# Patient Record
Sex: Male | Born: 1980 | Race: White | Hispanic: No | Marital: Married | Smoking: Former smoker
Health system: Southern US, Community
[De-identification: ages and names within clinical notes are randomized; demographics above are authoritative.]

## PROBLEM LIST (undated history)

## (undated) DIAGNOSIS — Z87448 Personal history of other diseases of urinary system: Secondary | ICD-10-CM

## (undated) DIAGNOSIS — Z8701 Personal history of pneumonia (recurrent): Secondary | ICD-10-CM

## (undated) DIAGNOSIS — R198 Other specified symptoms and signs involving the digestive system and abdomen: Secondary | ICD-10-CM

## (undated) DIAGNOSIS — Z87718 Personal history of other specified (corrected) congenital malformations of genitourinary system: Secondary | ICD-10-CM

## (undated) DIAGNOSIS — F419 Anxiety disorder, unspecified: Secondary | ICD-10-CM

## (undated) DIAGNOSIS — Q059 Spina bifida, unspecified: Secondary | ICD-10-CM

## (undated) DIAGNOSIS — Z932 Ileostomy status: Secondary | ICD-10-CM

## (undated) DIAGNOSIS — S82841A Displaced bimalleolar fracture of right lower leg, initial encounter for closed fracture: Secondary | ICD-10-CM

## (undated) DIAGNOSIS — Z87828 Personal history of other (healed) physical injury and trauma: Secondary | ICD-10-CM

## (undated) DIAGNOSIS — R1310 Dysphagia, unspecified: Secondary | ICD-10-CM

## (undated) DIAGNOSIS — S20219A Contusion of unspecified front wall of thorax, initial encounter: Secondary | ICD-10-CM

## (undated) DIAGNOSIS — Z87442 Personal history of urinary calculi: Secondary | ICD-10-CM

## (undated) DIAGNOSIS — K219 Gastro-esophageal reflux disease without esophagitis: Secondary | ICD-10-CM

## (undated) DIAGNOSIS — Z7409 Other reduced mobility: Secondary | ICD-10-CM

## (undated) DIAGNOSIS — T4145XA Adverse effect of unspecified anesthetic, initial encounter: Secondary | ICD-10-CM

## (undated) DIAGNOSIS — Q6410 Exstrophy of urinary bladder, unspecified: Secondary | ICD-10-CM

## (undated) DIAGNOSIS — Z8739 Personal history of other diseases of the musculoskeletal system and connective tissue: Secondary | ICD-10-CM

## (undated) DIAGNOSIS — T8859XA Other complications of anesthesia, initial encounter: Secondary | ICD-10-CM

## (undated) DIAGNOSIS — R6889 Other general symptoms and signs: Secondary | ICD-10-CM

## (undated) HISTORY — PX: BLADDER SURGERY: SHX569

## (undated) HISTORY — PX: HIP SURGERY: SHX245

## (undated) HISTORY — PX: BACK SURGERY: SHX140

## (undated) HISTORY — PX: LEG SURGERY: SHX1003

## (undated) HISTORY — PX: ABDOMINAL SURGERY: SHX537

---

## 2014-01-06 DIAGNOSIS — Z8701 Personal history of pneumonia (recurrent): Secondary | ICD-10-CM

## 2014-01-06 HISTORY — DX: Personal history of pneumonia (recurrent): Z87.01

## 2014-01-15 ENCOUNTER — Emergency Department (HOSPITAL_COMMUNITY)
Admission: EM | Admit: 2014-01-15 | Discharge: 2014-01-15 | Disposition: A | Discharge status: Home / Self Care | Payer: Medicaid Other | Attending: Emergency Medicine | Admitting: Emergency Medicine

## 2014-01-15 ENCOUNTER — Encounter (HOSPITAL_COMMUNITY): Payer: Self-pay | Admitting: *Deleted

## 2014-01-15 DIAGNOSIS — Q059 Spina bifida, unspecified: Secondary | ICD-10-CM | POA: Insufficient documentation

## 2014-01-15 DIAGNOSIS — Z87828 Personal history of other (healed) physical injury and trauma: Secondary | ICD-10-CM | POA: Diagnosis not present

## 2014-01-15 DIAGNOSIS — Z91010 Allergy to peanuts: Secondary | ICD-10-CM | POA: Diagnosis not present

## 2014-01-15 DIAGNOSIS — N201 Calculus of ureter: Secondary | ICD-10-CM | POA: Diagnosis not present

## 2014-01-15 DIAGNOSIS — Z932 Ileostomy status: Secondary | ICD-10-CM | POA: Diagnosis not present

## 2014-01-15 DIAGNOSIS — N200 Calculus of kidney: Secondary | ICD-10-CM | POA: Diagnosis present

## 2014-01-15 DIAGNOSIS — Q6410 Exstrophy of urinary bladder, unspecified: Secondary | ICD-10-CM | POA: Insufficient documentation

## 2014-01-15 DIAGNOSIS — Z720 Tobacco use: Secondary | ICD-10-CM | POA: Insufficient documentation

## 2014-01-15 HISTORY — DX: Spina bifida, unspecified: Q05.9

## 2014-01-15 LAB — CBC WITH DIFFERENTIAL/PLATELET
BASOS ABS: 0 10*3/uL (ref 0.0–0.1)
Basophils Relative: 0 % (ref 0–1)
Eosinophils Absolute: 0 10*3/uL (ref 0.0–0.7)
Eosinophils Relative: 0 % (ref 0–5)
HCT: 43.2 % (ref 39.0–52.0)
Hemoglobin: 14.7 g/dL (ref 13.0–17.0)
LYMPHS ABS: 1.1 10*3/uL (ref 0.7–4.0)
LYMPHS PCT: 4 % — AB (ref 12–46)
MCH: 29.3 pg (ref 26.0–34.0)
MCHC: 34 g/dL (ref 30.0–36.0)
MCV: 86.2 fL (ref 78.0–100.0)
Monocytes Absolute: 1.7 10*3/uL — ABNORMAL HIGH (ref 0.1–1.0)
Monocytes Relative: 6 % (ref 3–12)
NEUTROS ABS: 25.7 10*3/uL — AB (ref 1.7–7.7)
Neutrophils Relative %: 90 % — ABNORMAL HIGH (ref 43–77)
PLATELETS: 166 10*3/uL (ref 150–400)
RBC: 5.01 MIL/uL (ref 4.22–5.81)
RDW: 13.8 % (ref 11.5–15.5)
WBC: 28.4 10*3/uL — ABNORMAL HIGH (ref 4.0–10.5)

## 2014-01-15 LAB — LIPASE, BLOOD: LIPASE: 8 U/L — AB (ref 11–59)

## 2014-01-15 LAB — COMPREHENSIVE METABOLIC PANEL
ALBUMIN: 3.2 g/dL — AB (ref 3.5–5.2)
ALT: 35 U/L (ref 0–53)
AST: 36 U/L (ref 0–37)
Alkaline Phosphatase: 93 U/L (ref 39–117)
Anion gap: 17 — ABNORMAL HIGH (ref 5–15)
BUN: 20 mg/dL (ref 6–23)
CHLORIDE: 98 meq/L (ref 96–112)
CO2: 18 meq/L — AB (ref 19–32)
Calcium: 8.9 mg/dL (ref 8.4–10.5)
Creatinine, Ser: 2.04 mg/dL — ABNORMAL HIGH (ref 0.50–1.35)
GFR calc Af Amer: 48 mL/min — ABNORMAL LOW (ref >90)
GFR, EST NON AFRICAN AMERICAN: 41 mL/min — AB (ref >90)
Glucose, Bld: 92 mg/dL (ref 70–99)
Potassium: 4.5 mEq/L (ref 3.7–5.3)
SODIUM: 133 meq/L — AB (ref 137–147)
Total Bilirubin: 2.5 mg/dL — ABNORMAL HIGH (ref 0.3–1.2)
Total Protein: 7.8 g/dL (ref 6.0–8.3)

## 2014-01-15 MED ORDER — SODIUM CHLORIDE 0.9 % IV BOLUS (SEPSIS)
1000.0000 mL | Freq: Once | INTRAVENOUS | Status: AC
  Administered 2014-01-15: 1000 mL via INTRAVENOUS

## 2014-01-15 MED ORDER — ONDANSETRON HCL 4 MG/2ML IJ SOLN
4.0000 mg | Freq: Once | INTRAMUSCULAR | Status: AC
  Administered 2014-01-15: 4 mg via INTRAVENOUS
  Filled 2014-01-15: qty 2

## 2014-01-15 MED ORDER — HYDROMORPHONE HCL 1 MG/ML IJ SOLN
1.0000 mg | Freq: Once | INTRAMUSCULAR | Status: AC
  Administered 2014-01-15: 1 mg via INTRAVENOUS
  Filled 2014-01-15: qty 1

## 2014-01-15 NOTE — Discharge Instructions (Signed)
Return to the emergency room with worsening of symptoms, new symptoms or with symptoms that are concerning, especially fevers, chills, unable to urinate like normal, unable to keep fluids down. Call to make appointment as soon as possible with urology. Take your home pain medications as needed. Do not operate machinery, drive or drink alcohol while taking narcotics or muscle relaxers.   Kidney Stones Kidney stones (urolithiasis) are deposits that form inside your kidneys. The intense pain is caused by the stone moving through the urinary tract. When the stone moves, the ureter goes into spasm around the stone. The stone is usually passed in the urine.  CAUSES   A disorder that makes certain neck glands produce too much parathyroid hormone (primary hyperparathyroidism).  A buildup of uric acid crystals, similar to gout in your joints.  Narrowing (stricture) of the ureter.  A kidney obstruction present at birth (congenital obstruction).  Previous surgery on the kidney or ureters.  Numerous kidney infections. SYMPTOMS   Feeling sick to your stomach (nauseous).  Throwing up (vomiting).  Blood in the urine (hematuria).  Pain that usually spreads (radiates) to the groin.  Frequency or urgency of urination. DIAGNOSIS   Taking a history and physical exam.  Blood or urine tests.  CT scan.  Occasionally, an examination of the inside of the urinary bladder (cystoscopy) is performed. TREATMENT   Observation.  Increasing your fluid intake.  Extracorporeal shock wave lithotripsy--This is a noninvasive procedure that uses shock waves to break up kidney stones.  Surgery may be needed if you have severe pain or persistent obstruction. There are various surgical procedures. Most of the procedures are performed with the use of small instruments. Only small incisions are needed to accommodate these instruments, so recovery time is minimized. The size, location, and chemical composition  are all important variables that will determine the proper choice of action for you. Talk to your health care provider to better understand your situation so that you will minimize the risk of injury to yourself and your kidney.  HOME CARE INSTRUCTIONS   Drink enough water and fluids to keep your urine clear or pale yellow. This will help you to pass the stone or stone fragments.  Strain all urine through the provided strainer. Keep all particulate matter and stones for your health care provider to see. The stone causing the pain may be as small as a grain of salt. It is very important to use the strainer each and every time you pass your urine. The collection of your stone will allow your health care provider to analyze it and verify that a stone has actually passed. The stone analysis will often identify what you can do to reduce the incidence of recurrences.  Only take over-the-counter or prescription medicines for pain, discomfort, or fever as directed by your health care provider.  Make a follow-up appointment with your health care provider as directed.  Get follow-up X-rays if required. The absence of pain does not always mean that the stone has passed. It may have only stopped moving. If the urine remains completely obstructed, it can cause loss of kidney function or even complete destruction of the kidney. It is your responsibility to make sure X-rays and follow-ups are completed. Ultrasounds of the kidney can show blockages and the status of the kidney. Ultrasounds are not associated with any radiation and can be performed easily in a matter of minutes. SEEK MEDICAL CARE IF:  You experience pain that is progressive and unresponsive to any  pain medicine you have been prescribed. SEEK IMMEDIATE MEDICAL CARE IF:   Pain cannot be controlled with the prescribed medicine.  You have a fever or shaking chills.  The severity or intensity of pain increases over 18 hours and is not relieved by  pain medicine.  You develop a new onset of abdominal pain.  You feel faint or pass out.  You are unable to urinate. MAKE SURE YOU:   Understand these instructions.  Will watch your condition.  Will get help right away if you are not doing well or get worse. Document Released: 02/22/2005 Document Revised: 10/25/2012 Document Reviewed: 07/26/2012 [facility] Patient Information 2015 [employer] [place]. This information is not intended to replace advice given to you by your health care provider. Make sure you discuss any questions you have with your health care provider.

## 2014-01-15 NOTE — Progress Notes (Signed)
  CARE MANAGEMENT ED NOTE 01/15/2014  Patient:  Coye,Estefan   Account Number:  [id number]  Date Initiated:  01/15/2014  Documentation initiated by:  Radford PaxFERRERO,Monaye Blackie  Subjective/Objective Assessment:   Patient presents to Ed with  abdominal pian/kidney stone     Subjective/Objective Assessment Detail:     Action/Plan:   Action/Plan Detail:   Anticipated DC Date:       Status Recommendation to Physician:   Result of Recommendation:    Other ED Services  Consult Working Plan    DC Planning Services  Other  PCP issues    Choice offered to / List presented to:            Status of service:  Completed, signed off  ED Comments:   ED Comments Detail:  EDCM spoke to patient at bedisde.  Patient initially listed as not having a pcp or insurnace.  Patient reports he has Medicaid insurnace and will be seeing Dr. Concepcion [facility]Avbuere [facility] as [facility].  System updated.

## 2014-01-15 NOTE — ED Provider Notes (Signed)
CSN: [id number]     Arrival date & time 01/15/14  1537 History   First MD Initiated Contact with Patient 01/15/14 1608     Chief Complaint  Patient presents with  . Nephrolithiasis     (Consider location/radiation/quality/duration/timing/severity/associated sxs/prior Treatment) HPI  Blake Lara is a 33 y.o. male with PMH of spina bifida, bladder exstrophy, dislocated hip, ileostomy presenting with with persistent pain in left flank that radiates into left and diminishing groin. [facility] and diagnosed by CT with a 7 mm kidney stone in what patient believes with the left kidney. He was given Dilaudid 4 tablets which he has taken two of but did not get his script filled for dilaudid. Patient unable to describe the pain and endorses nausea and 3 episodes of emesis today. He says the pain is constant and is unable to describe it. He also endorses blood in his urine and states that his urine only dribbles and he is unable to urinate.   Past Medical History  Diagnosis Date  . Spina bifida   . Dislocated hip     hx of  . Bladder extrophy   . Ileostomy present    Past Surgical History  Procedure Laterality Date  . Abdominal surgery    . Colon surgery     History reviewed. No pertinent family history. History  Substance Use Topics  . Smoking status: Current Every Day Smoker -- 0.50 packs/day for .2 years    Types: Cigarettes  . Smokeless tobacco: Not on file  . Alcohol Use: Not on file    Review of Systems  Constitutional: Negative for fever and chills.  HENT: Negative for congestion and rhinorrhea.   Eyes: Negative for visual disturbance.  Respiratory: Negative for cough and shortness of breath.   Cardiovascular: Negative for chest pain and palpitations.  Gastrointestinal: Negative for nausea, vomiting and diarrhea.  Genitourinary: Positive for dysuria, hematuria and flank pain.  Musculoskeletal: Negative for back pain and gait problem.   Skin: Negative for rash.  Neurological: Negative for weakness and headaches.      Allergies  Eggs or egg-derived products; Latex; and Peanut-containing drug products  Home Medications   Prior to Admission medications   Medication Sig Start Date End Date Taking? Authorizing Provider  HYDROmorphone (DILAUDID) 4 MG tablet Take 8 mg by mouth every 4 (four) hours as needed for severe pain.   Yes Historical Provider, MD   BP 150/99 mmHg  Pulse 113  Temp(Src) 97.9 F (36.6 C) (Oral)  Resp 27  Wt 227 lb (102.967 kg)  SpO2 94% Physical Exam  Constitutional: He appears well-developed and well-nourished. No distress.  HENT:  Head: Normocephalic and atraumatic.  Eyes: Conjunctivae and EOM are normal. Right eye exhibits no discharge. Left eye exhibits no discharge.  Cardiovascular: Normal rate, regular rhythm and normal heart sounds.   Pulmonary/Chest: Effort normal and breath sounds normal. No respiratory distress. He has no wheezes.  Abdominal:  Abdomen soft with tenderness in all four quadrants with rebound, voluntary guarding. No rigidity. Hypoactive bowel sounds. L CVA tenderness.  Neurological: He is alert. He exhibits normal muscle tone. Coordination normal.  Skin: Skin is warm and dry. He is not diaphoretic.  Nursing note and vitals reviewed.   ED Course  Procedures (including critical care time) Labs Review Labs Reviewed  CBC WITH DIFFERENTIAL - Abnormal; Notable for the following:    WBC 28.4 (*)    Neutrophils Relative % 90 (*)    Neutro  Abs 25.7 (*)    Lymphocytes Relative 4 (*)    Monocytes Absolute 1.7 (*)    All other components within normal limits  COMPREHENSIVE METABOLIC PANEL - Abnormal; Notable for the following:    Sodium 133 (*)    CO2 18 (*)    Creatinine, Ser 2.04 (*)    Albumin 3.2 (*)    Total Bilirubin 2.5 (*)    GFR calc non Af Amer 41 (*)    GFR calc Af Amer 48 (*)    Anion gap 17 (*)    All other components within normal limits  LIPASE,  BLOOD - Abnormal; Notable for the following:    Lipase 8 (*)    All other components within normal limits    Imaging Review No results found.   EKG Interpretation None      MDM   Final diagnoses:  Ureterolithiasis   Pt diagnosed with kidney [facility] yesterday [facility]. We obtained medical records which showed 6-67mm kidney stone in left distal ureter with mild hydronephrosis and dilation of ureter in CT. Pt without appointment with urology. Patient refused giving urine sample because he cannot urinate or have catheterization due to his bladder exstrophy. Pt with creatine of 2.04 and spoke with his PCP Dr. Selena BattenKim who initially believed he had baseline labs but did not because he had no seen the patient in clinic before. Pt also with 28.4 WBC. Pts bp became soft at 83/39 likely due to dilaudid but it returned to 150/99 before discharge. Pt with tachycardia and pt states this is normal for him. Likely in part due to pain and was not reduced with fluids. Pt pain managed in ED. Patient is afebrile, nontoxic, and in no acute distress. Patient is appropriate for outpatient management and is stable for discharge. Pt given referral to [facility] and to follow up as soon as possible.    Discussed return precautions with patient. Discussed all results and patient verbalizes understanding and agrees with plan.  Case has been discussed with Dr. Effie ShyWentz who agrees with the above plan and to discharge.      Louann SjogrenVictoria L Loriann Bosserman, PA-C 01/16/14 [phone]  Flint MelterElliott L Wentz, MD 01/17/14 [phone]

## 2014-01-15 NOTE — ED Notes (Signed)
Per ems pt is from home, hx of spina bifida, went to [facility] yesterday, dx with 7 mm kidney stone left side, was given 4 dilaudid tablets upon discharge to take at home, also given presciption for dilaudid, pt did not get it filled, pt has only taken a total of 2 tablets since discharge. At present pain 10/10. Left flank area radiating to abdomen.

## 2014-01-15 NOTE — ED Notes (Signed)
Patient stated that he can not urinate, patient also stated that he can not be cathed. Patient says he constantly dribbles. PA and RN aware of situation

## 2014-01-16 ENCOUNTER — Inpatient Hospital Stay (HOSPITAL_COMMUNITY)
Admission: AD | Admit: 2014-01-16 | Discharge: 2014-01-23 | DRG: 659 | Disposition: A | Discharge status: Home / Self Care | Payer: Medicaid Other | Source: Ambulatory Visit | Attending: Internal Medicine | Admitting: Internal Medicine

## 2014-01-16 ENCOUNTER — Inpatient Hospital Stay (HOSPITAL_COMMUNITY): Payer: Medicaid Other

## 2014-01-16 DIAGNOSIS — T501X5A Adverse effect of loop [high-ceiling] diuretics, initial encounter: Secondary | ICD-10-CM | POA: Diagnosis present

## 2014-01-16 DIAGNOSIS — Y95 Nosocomial condition: Secondary | ICD-10-CM | POA: Diagnosis present

## 2014-01-16 DIAGNOSIS — N1330 Unspecified hydronephrosis: Secondary | ICD-10-CM

## 2014-01-16 DIAGNOSIS — R0602 Shortness of breath: Secondary | ICD-10-CM

## 2014-01-16 DIAGNOSIS — J9600 Acute respiratory failure, unspecified whether with hypoxia or hypercapnia: Secondary | ICD-10-CM

## 2014-01-16 DIAGNOSIS — Z933 Colostomy status: Secondary | ICD-10-CM

## 2014-01-16 DIAGNOSIS — R739 Hyperglycemia, unspecified: Secondary | ICD-10-CM | POA: Diagnosis present

## 2014-01-16 DIAGNOSIS — Q059 Spina bifida, unspecified: Secondary | ICD-10-CM

## 2014-01-16 DIAGNOSIS — G4733 Obstructive sleep apnea (adult) (pediatric): Secondary | ICD-10-CM | POA: Diagnosis present

## 2014-01-16 DIAGNOSIS — F40240 Claustrophobia: Secondary | ICD-10-CM | POA: Diagnosis present

## 2014-01-16 DIAGNOSIS — D696 Thrombocytopenia, unspecified: Secondary | ICD-10-CM | POA: Diagnosis present

## 2014-01-16 DIAGNOSIS — E876 Hypokalemia: Secondary | ICD-10-CM | POA: Diagnosis not present

## 2014-01-16 DIAGNOSIS — N132 Hydronephrosis with renal and ureteral calculous obstruction: Principal | ICD-10-CM | POA: Diagnosis present

## 2014-01-16 DIAGNOSIS — Q6410 Exstrophy of urinary bladder, unspecified: Secondary | ICD-10-CM

## 2014-01-16 DIAGNOSIS — E669 Obesity, unspecified: Secondary | ICD-10-CM | POA: Diagnosis present

## 2014-01-16 DIAGNOSIS — D649 Anemia, unspecified: Secondary | ICD-10-CM | POA: Diagnosis present

## 2014-01-16 DIAGNOSIS — F17210 Nicotine dependence, cigarettes, uncomplicated: Secondary | ICD-10-CM | POA: Diagnosis present

## 2014-01-16 DIAGNOSIS — R509 Fever, unspecified: Secondary | ICD-10-CM

## 2014-01-16 DIAGNOSIS — J9601 Acute respiratory failure with hypoxia: Secondary | ICD-10-CM | POA: Diagnosis not present

## 2014-01-16 DIAGNOSIS — J189 Pneumonia, unspecified organism: Secondary | ICD-10-CM | POA: Diagnosis not present

## 2014-01-16 DIAGNOSIS — N179 Acute kidney failure, unspecified: Secondary | ICD-10-CM | POA: Diagnosis not present

## 2014-01-16 DIAGNOSIS — R918 Other nonspecific abnormal finding of lung field: Secondary | ICD-10-CM | POA: Insufficient documentation

## 2014-01-16 DIAGNOSIS — Z87728 Personal history of other specified (corrected) congenital malformations of nervous system and sense organs: Secondary | ICD-10-CM

## 2014-01-16 DIAGNOSIS — N135 Crossing vessel and stricture of ureter without hydronephrosis: Secondary | ICD-10-CM

## 2014-01-16 DIAGNOSIS — Z6841 Body Mass Index (BMI) 40.0 and over, adult: Secondary | ICD-10-CM

## 2014-01-16 DIAGNOSIS — T50995A Adverse effect of other drugs, medicaments and biological substances, initial encounter: Secondary | ICD-10-CM | POA: Diagnosis not present

## 2014-01-16 DIAGNOSIS — E872 Acidosis: Secondary | ICD-10-CM | POA: Diagnosis present

## 2014-01-16 DIAGNOSIS — J690 Pneumonitis due to inhalation of food and vomit: Secondary | ICD-10-CM | POA: Diagnosis not present

## 2014-01-16 DIAGNOSIS — G92 Toxic encephalopathy: Secondary | ICD-10-CM | POA: Diagnosis not present

## 2014-01-16 DIAGNOSIS — N200 Calculus of kidney: Secondary | ICD-10-CM | POA: Diagnosis present

## 2014-01-16 DIAGNOSIS — T17908A Unspecified foreign body in respiratory tract, part unspecified causing other injury, initial encounter: Secondary | ICD-10-CM

## 2014-01-16 HISTORY — PX: PERCUTANEOUS NEPHROSTOMY: SHX2208

## 2014-01-16 MED ORDER — MIDAZOLAM HCL 2 MG/2ML IJ SOLN
INTRAMUSCULAR | Status: AC | PRN
  Administered 2014-01-16: 1 mg via INTRAVENOUS
  Administered 2014-01-16: 2 mg via INTRAVENOUS
  Administered 2014-01-16 (×2): 1 mg via INTRAVENOUS

## 2014-01-16 MED ORDER — FENTANYL CITRATE 0.05 MG/ML IJ SOLN
INTRAMUSCULAR | Status: AC
  Filled 2014-01-16: qty 6

## 2014-01-16 MED ORDER — DIPHENHYDRAMINE HCL 12.5 MG/5ML PO ELIX
12.5000 mg | ORAL_SOLUTION | Freq: Four times a day (QID) | ORAL | Status: DC | PRN
  Filled 2014-01-16: qty 5

## 2014-01-16 MED ORDER — CIPROFLOXACIN IN D5W 400 MG/200ML IV SOLN
400.0000 mg | Freq: Once | INTRAVENOUS | Status: AC
  Administered 2014-01-16: 400 mg via INTRAVENOUS

## 2014-01-16 MED ORDER — LIDOCAINE HCL 1 % IJ SOLN
INTRAMUSCULAR | Status: AC
Start: 2014-01-16 — End: 2014-01-16
  Administered 2014-01-16: 22:00:00
  Filled 2014-01-16: qty 40

## 2014-01-16 MED ORDER — DIPHENHYDRAMINE HCL 50 MG/ML IJ SOLN
INTRAMUSCULAR | Status: AC
  Administered 2014-01-16: 37.5 mg via INTRAVENOUS
  Filled 2014-01-16: qty 1

## 2014-01-16 MED ORDER — DOCUSATE SODIUM 100 MG PO CAPS
100.0000 mg | ORAL_CAPSULE | Freq: Two times a day (BID) | ORAL | Status: DC
Start: 2014-01-16 — End: 2014-01-23
  Administered 2014-01-16 – 2014-01-23 (×13): 100 mg via ORAL
  Filled 2014-01-16 (×15): qty 1

## 2014-01-16 MED ORDER — CIPROFLOXACIN IN D5W 400 MG/200ML IV SOLN
INTRAVENOUS | Status: AC
  Administered 2014-01-16: 400 mg via INTRAVENOUS
  Filled 2014-01-16: qty 200

## 2014-01-16 MED ORDER — ONDANSETRON HCL 4 MG/2ML IJ SOLN
4.0000 mg | INTRAMUSCULAR | Status: DC | PRN
  Filled 2014-01-16: qty 2

## 2014-01-16 MED ORDER — DIPHENHYDRAMINE HCL 50 MG/ML IJ SOLN
12.5000 mg | Freq: Four times a day (QID) | INTRAMUSCULAR | Status: DC | PRN
  Administered 2014-01-16 – 2014-01-17 (×2): 12.5 mg via INTRAVENOUS
  Filled 2014-01-16 (×2): qty 1

## 2014-01-16 MED ORDER — CEFTRIAXONE SODIUM IN DEXTROSE 20 MG/ML IV SOLN
1.0000 g | INTRAVENOUS | Status: DC
  Administered 2014-01-16: 1 g via INTRAVENOUS
  Filled 2014-01-16: qty 50

## 2014-01-16 MED ORDER — IOHEXOL 300 MG/ML  SOLN
20.0000 mL | Freq: Once | INTRAMUSCULAR | Status: AC | PRN
  Administered 2014-01-16: 15 mL

## 2014-01-16 MED ORDER — MIDAZOLAM HCL 2 MG/2ML IJ SOLN
INTRAMUSCULAR | Status: AC
Start: 2014-01-16 — End: 2014-01-16
  Filled 2014-01-16: qty 6

## 2014-01-16 MED ORDER — FENTANYL CITRATE 0.05 MG/ML IJ SOLN
INTRAMUSCULAR | Status: AC | PRN
  Administered 2014-01-16 (×2): 25 ug via INTRAVENOUS
  Administered 2014-01-16: 50 ug via INTRAVENOUS

## 2014-01-16 MED ORDER — IOHEXOL 300 MG/ML  SOLN
INTRAMUSCULAR | Status: AC | PRN
  Administered 2014-01-16: 1 mL

## 2014-01-16 MED ORDER — ONDANSETRON HCL 4 MG/2ML IJ SOLN
4.0000 mg | Freq: Four times a day (QID) | INTRAMUSCULAR | Status: DC | PRN
Start: 2014-01-16 — End: 2014-01-23
  Administered 2014-01-16 – 2014-01-23 (×9): 4 mg via INTRAVENOUS
  Filled 2014-01-16 (×9): qty 2

## 2014-01-16 MED ORDER — DEXTROSE-NACL 5-0.45 % IV SOLN
INTRAVENOUS | Status: DC
  Administered 2014-01-16: 18:00:00 via INTRAVENOUS

## 2014-01-16 MED ORDER — HYDROMORPHONE HCL 1 MG/ML IJ SOLN
0.5000 mg | INTRAMUSCULAR | Status: DC | PRN
  Administered 2014-01-16: 1 mg via INTRAVENOUS
  Filled 2014-01-16: qty 1

## 2014-01-16 MED ORDER — ENOXAPARIN SODIUM 30 MG/0.3ML ~~LOC~~ SOLN
30.0000 mg | SUBCUTANEOUS | Status: DC
  Administered 2014-01-17 – 2014-01-19 (×4): 30 mg via SUBCUTANEOUS
  Filled 2014-01-16 (×6): qty 0.3

## 2014-01-16 MED ORDER — DIPHENHYDRAMINE HCL 50 MG/ML IJ SOLN
50.0000 mg | Freq: Once | INTRAMUSCULAR | Status: AC
  Administered 2014-01-16: 37.5 mg via INTRAVENOUS

## 2014-01-16 MED ORDER — ZOLPIDEM TARTRATE 5 MG PO TABS
5.0000 mg | ORAL_TABLET | Freq: Every evening | ORAL | Status: DC | PRN
  Administered 2014-01-16: 5 mg via ORAL
  Filled 2014-01-16: qty 1

## 2014-01-16 MED ORDER — CIPROFLOXACIN IN D5W 400 MG/200ML IV SOLN: 400.0000 mg | INTRAVENOUS | Status: DC

## 2014-01-16 MED ORDER — HYDROMORPHONE HCL 1 MG/ML IJ SOLN
1.0000 mg | INTRAMUSCULAR | Status: DC | PRN
  Administered 2014-01-16: 1 mg via INTRAVENOUS
  Administered 2014-01-17 (×2): 2 mg via INTRAVENOUS
  Filled 2014-01-16: qty 2
  Filled 2014-01-16: qty 1
  Filled 2014-01-16: qty 2

## 2014-01-16 NOTE — Procedures (Signed)
Procedure:  Left percutaneous nephrostomy Findings:  Moderate left hydronephrosis.  10 Fr PCN placed and formed in renal pelvis. Attached to gravity bag.  Urine sample sent for culture.

## 2014-01-16 NOTE — Consult Note (Signed)
Reason for consult: left percutaneous nephrostomy   Referring Physician(s): Dr. Laverle PatterBorden  History of Present Illness: Blake Lara is a 33 y.o. male with history of spina bifida and bladder extrophy, status post multiple urologic procedures with his last procedure having been [facility] at age 33. 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 He was apparently incarcerated for 17 years and has not had any ongoing urologic care over that period of time. He also apparently has had nephrostomy tubes in the past and possibly ureteral procedures although there is no documented history of present today. He developed the acute onset of severe left-sided flank pain with radiation of his left lower quadrant 2 days ago. This was associated with fever up to 102F. He has had shaking chills. He has no prior history of kidney stones. He was seen in [place] emergency department [place] and a CT scan was performed which demonstrated a distal 6-7 mm obstructing ureteral calculus and 2-3 small nonobstructing left renal calculi.  He was apparently discharged home and then [facility] evening. At that time, he was noted to have a white blood count of 28,000 and a serum creatinine of 2.0. He was also noted to be hypotensive and tachycardic. However, he was discharged home from the ED with plans for outpatient follow-up. He presents today with continued severe left-sided flank pain. He has had significant nausea and vomiting. Request now received for left percutaneous nephrostomy.    Past Medical History  Diagnosis Date  . Spina bifida   . Dislocated hip     hx of  . Bladder extrophy   . Ileostomy present     Past Surgical History  Procedure Laterality Date  . Abdominal surgery    . Colon surgery      Allergies: Eggs or egg-derived products; Latex; and Peanut-containing drug  products  Medications: Prior to Admission medications   Medication Sig Start Date End Date Taking? Authorizing Provider  HYDROmorphone (DILAUDID) 4 MG tablet Take 8 mg by mouth every 4 (four) hours as needed for severe pain.    Historical Provider, MD    No family history on file.  History   Social History  . Marital Status: Married    Spouse Name: N/A    Number of Children: N/A  . Years of Education: N/A   Social History Main Topics  . Smoking status: Current Every Day Smoker -- 0.50 packs/day for .2 years    Types: Cigarettes  . Smokeless tobacco: Not on file  . Alcohol Use: Not on file  . Drug Use: No  . Sexual Activity: Not on file   Other Topics Concern  . Not on file   Social History Narrative  . No narrative on file        Review of Systems  see H& P  Vital Signs: BP 107/43 mmHg  Pulse 100  Temp(Src) 97.3 F (36.3 C) (Oral)  Resp 20  SpO2 96%  Physical Exam pt awake/alert; chest- sl dim BS bases; heart - tachy but regular; abd- soft, +BS, obese, multiple old incisions /scars noted, LLQ ostomy (ileostomy per pt), diffuse tenderness, primarily left lower abd/flank region  Imaging: No results found.  Labs:  CBC:  Recent Labs  01/15/14 1702  WBC 28.4*  HGB 14.7  HCT 43.2  PLT 166    COAGS: No results for input(s): INR, APTT in the last 8760 hours.  BMP:  Recent Labs  01/15/14 1702  NA 133*  K 4.5  CL 98  CO2 18*  GLUCOSE 92  BUN 20  CALCIUM 8.9  CREATININE 2.04*  GFRNONAA 41*  GFRAA 48*    LIVER FUNCTION TESTS:  Recent Labs  01/15/14 1702  BILITOT 2.5*  AST 36  ALT 35  ALKPHOS 93  PROT 7.8  ALBUMIN 3.2*    TUMOR MARKERS: No results for input(s): AFPTM, CEA, CA199, CHROMGRNA in the last 8760 hours.  Assessment and Plan: Pt with recent fever, abd pain, leukocytosis, left ureteral stone with probable obstructive hydronephrosis, hx bladder extrophy; case d/w Drs. Borden/Yamagata. Plan is for left percutaneous  nephrostomy this evening for decompression. Details/risks of procedure d/w pt/family with their understanding and consent.        I spent a total of 20 minutes face to face in clinical consultation, greater than 50% of which was counseling/coordinating care for left percutaneous nephrostomy.  Signed: Chinita PesterALLRED,D KEVIN 01/16/2014, 5:00 PM

## 2014-01-16 NOTE — H&P (Addendum)
Chief Complaint Kidney stone   History of Present Illness Blake Lara is a 33 year old gentleman who apparently has a history of spina bifida and bladder exstrophy status post multiple urologic procedures with his last procedure having been [facility] at age 33. 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 He was apparently incarcerated for 17 years and has not had any ongoing urologic care over that period of time. He also apparently has had nephrostomy tubes in the past and possibly ureteral procedures although there is no documented history of present today. He developed the acute onset of severe left-sided flank pain with radiation of his left lower quadrant 2 days ago. This was associated with fever up to 102F. He has had shaking chills. He has no prior history of kidney stones. He was seen in [place] emergency department [place] and a CT scan was performed which demonstrated a distal 6-7 mm obstructing ureteral calculus and 2-3 small nonobstructing left renal calculi. These films are not present for independent review today although I do have the report. He was apparently discharged home and then presented [facility] last evening. At that time, he was noted to have a white blood count of 28,000 and a serum creatinine of 2.0. He was also noted to be hypotensive and tachycardic. However, he was discharged home from the ED with plans for outpatient follow-up. He presents today with continued severe left-sided flank pain. He has had significant nausea and vomiting.   Past Medical History Problems  1. 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 History of dislocation of hip (Z87.39) 4. History of spina bifida (W41.324(Z87.728)  Surgical History Problems  1. History of Abdominal Surgery 2. History of Hip Surgery  He has a history of multiple abdominal procedures and is  unsure of exactly what type of procedures he has had in the past. On talking to him, it sounds like he has had a colostomy as well as an attempt at repair of his bladder exstrophy multiple times.   Current Meds 1. Ciprofloxacin HCl - 500 MG Oral Tablet;  Therapy: (Recorded:11Nov2015) to Recorded 2. Dilaudid 4 MG Oral Tablet (HYDROmorphone HCl);  Therapy: (Recorded:11Nov2015) to Recorded 3. Opana ER 20 MG TB12 (Oxymorphone HCl ER);  Therapy: (Recorded:11Nov2015) to Recorded 4. Roxicet 5-325 MG Oral Tablet;  Therapy: (Recorded:11Nov2015) to Recorded  Allergies Medication  1. Aleve TABS Non-Medication  2. Eggs 3. Latex 4. Peanuts  Family History Problems  1. Family history of Blood clot in vein : Sister 2. Family history of chronic obstructive pulmonary disease (Z82.5) : Mother 3. Family history of diabetes mellitus (Z83.3) : Mother, Sister 4. Family history of hypertension (Z82.49) : Mother, Sister 5. Denied: No pertinent family history : Father  Social History Problems    Current every day smoker (F17.200)   Married  Review of Systems  Gastrointestinal: nausea and vomiting.  Constitutional: fever, night sweats, feeling tired (fatigue) and recent weight loss.  Respiratory: shortness of breath and cough.  Musculoskeletal: back pain.  Neurological: dizziness.  Psychiatric: anxiety and depression.    Vitals Vital Signs [Data Includes: Last 1 Day]  Recorded: 11Nov2015 03:12PM  Height: 4 ft 11 in Weight: 228 lb  BMI Calculated: 46.05 BSA Calculated: 1.95 Blood Pressure: 103 / 68 Temperature: 98 F Heart Rate: 99 Recorded: 11Nov2015 03:06PM  Height: 4 ft  Weight: 104 lb  BMI Calculated: 31.74 BSA Calculated: 1.2  Physical  Exam Constitutional: Well nourished and well developed . No acute distress.  ENT:. The ears and nose are normal in appearance.  Neck: The appearance of the neck is normal and no neck mass is present.  Pulmonary: No respiratory distress and  normal respiratory rhythm and effort.  Cardiovascular: Heart rate and rhythm are normal . No peripheral edema.  Abdomen: He has numerous incisions and scars on his abdomen which is extremely disfigured. He does have a left lower quadrant colostomy. On his back, he does have evidence of prior nephrostomy tube placements and appears to have flank incisions. Toward his lower abdomen/suprapubic region, there is a small area of mucosa which appears to represent his exstrophied bladder. This tissue was extremely tender and he cannot tolerate an examination. Below the bladder, there is a large skin current area of tissue which is also extremely tender and is unable to be examined.  Skin: Normal skin turgor, no visible rash and no visible skin lesions.  Neuro/Psych:. Mood and affect are appropriate.    Results/Data I have reviewed his medical records and laboratory studies. I do not have his actual CT scan but do have his work port with findings as dictated above. His serum creatinine was 2.04. His white blood count was 28.3.     Assessment Assessed  1. Ureteral stone with hydronephrosis (N13.2)  Discussion/Summary 1. Left ureteral stone and fever: Blake Lara appears to be acutely infected with an obstructing left ureteral stone. Considering his bladder exstrophy and the likely difficulty finding his ureteral orifice, he will need to undergo left cutaneous nephrostomy tube placement. Considering his acute infection, I have recommended that he be hospitalized and have this performed today. He will also be placed on broad-spectrum IV antibiotics pending cultures from his nephrostomy placement. We will then need to further discuss definitive stone treatment after he has been adequately treated for his infection. This may require percutaneous antegrade stone therapy and I will also offer him the opportunity to consider treatment at [facility] where he has received his prior urologic care considering his very  complex situation.       Signatures Electronically signed by : Heloise PurpuraLester Kenshin Splawn, M.D.; Jan 16 2014  4:05PM EST

## 2014-01-16 NOTE — Progress Notes (Signed)
Lab unable to draw labs due to patient's difficult veinous access.  Spoke with [place], [place], and order received for foot stick.  Patient also appeared to have a rash on his right forearm shortly after admission.  About 2 hours later, very shortly after administering the rocephin to the patient, he called complaining of severe systemic itching and hives all over his body.  Patient had received benadryl for the rash on his forearm about an hour earlier.  Marchelle FolksAmanda, [place] notified, Radiology notified, and pharmacy notified about patient's reaction to Rocephin.  Rocephin disconnected and patient's primary IV fluid running.  Patient's respiratory status intact.

## 2014-01-16 NOTE — Plan of Care (Signed)
Problem: Consults Goal: General Medical Patient Education See Patient Education Module for specific education. Outcome: Completed/Met Date Met:  01/16/14

## 2014-01-17 ENCOUNTER — Other Ambulatory Visit: Payer: Self-pay

## 2014-01-17 ENCOUNTER — Encounter (HOSPITAL_COMMUNITY): Payer: Self-pay

## 2014-01-17 ENCOUNTER — Inpatient Hospital Stay (HOSPITAL_COMMUNITY): Payer: Medicaid Other

## 2014-01-17 DIAGNOSIS — E872 Acidosis: Secondary | ICD-10-CM

## 2014-01-17 DIAGNOSIS — J9601 Acute respiratory failure with hypoxia: Secondary | ICD-10-CM

## 2014-01-17 DIAGNOSIS — N390 Urinary tract infection, site not specified: Secondary | ICD-10-CM

## 2014-01-17 LAB — BLOOD GAS, ARTERIAL
Acid-base deficit: 10.8 mmol/L — ABNORMAL HIGH (ref 0.0–2.0)
Acid-base deficit: 3.5 mmol/L — ABNORMAL HIGH (ref 0.0–2.0)
Bicarbonate: 16.5 mEq/L — ABNORMAL LOW (ref 20.0–24.0)
Bicarbonate: 22.4 mEq/L (ref 20.0–24.0)
DELIVERY SYSTEMS: POSITIVE
DRAWN BY: 257701
Drawn by: 257701
Expiratory PAP: 6
FIO2: 0.8 %
FIO2: 0.4 %
INSPIRATORY PAP: 12
O2 SAT: 98.2 %
O2 Saturation: 86.7 %
PATIENT TEMPERATURE: 98.6
PH ART: 7.303 — AB (ref 7.350–7.450)
PO2 ART: 122 mmHg — AB (ref 80.0–100.0)
Patient temperature: 98.6
RATE: 8 resp/min
TCO2: 15.5 mmol/L (ref 0–100)
TCO2: 20.6 mmol/L (ref 0–100)
pCO2 arterial: 43.7 mmHg (ref 35.0–45.0)
pCO2 arterial: 46.7 mmHg — ABNORMAL HIGH (ref 35.0–45.0)
pH, Arterial: 7.203 — ABNORMAL LOW (ref 7.350–7.450)
pO2, Arterial: 53 mmHg — ABNORMAL LOW (ref 80.0–100.0)

## 2014-01-17 LAB — GLUCOSE, CAPILLARY: Glucose-Capillary: 263 mg/dL — ABNORMAL HIGH (ref 70–99)

## 2014-01-17 LAB — BASIC METABOLIC PANEL
ANION GAP: 15 (ref 5–15)
ANION GAP: 15 (ref 5–15)
BUN: 22 mg/dL (ref 6–23)
BUN: 25 mg/dL — ABNORMAL HIGH (ref 6–23)
CALCIUM: 8.2 mg/dL — AB (ref 8.4–10.5)
CALCIUM: 8.5 mg/dL (ref 8.4–10.5)
CO2: 18 mEq/L — ABNORMAL LOW (ref 19–32)
CO2: 19 mEq/L (ref 19–32)
CREATININE: 2.03 mg/dL — AB (ref 0.50–1.35)
CREATININE: 1.8 mg/dL — AB (ref 0.50–1.35)
Chloride: 101 mEq/L (ref 96–112)
Chloride: 99 mEq/L (ref 96–112)
GFR calc Af Amer: 48 mL/min — ABNORMAL LOW (ref >90)
GFR calc Af Amer: 55 mL/min — ABNORMAL LOW (ref >90)
GFR calc non Af Amer: 41 mL/min — ABNORMAL LOW (ref >90)
GFR calc non Af Amer: 48 mL/min — ABNORMAL LOW (ref >90)
Glucose, Bld: 110 mg/dL — ABNORMAL HIGH (ref 70–99)
Glucose, Bld: 124 mg/dL — ABNORMAL HIGH (ref 70–99)
Potassium: 3.4 mEq/L — ABNORMAL LOW (ref 3.7–5.3)
Potassium: 4.7 mEq/L (ref 3.7–5.3)
Sodium: 132 mEq/L — ABNORMAL LOW (ref 137–147)
Sodium: 135 mEq/L — ABNORMAL LOW (ref 137–147)

## 2014-01-17 LAB — CBC
HCT: 36.2 % — ABNORMAL LOW (ref 39.0–52.0)
Hemoglobin: 12.6 g/dL — ABNORMAL LOW (ref 13.0–17.0)
MCH: 29.3 pg (ref 26.0–34.0)
MCHC: 34.8 g/dL (ref 30.0–36.0)
MCV: 84.2 fL (ref 78.0–100.0)
PLATELETS: 121 10*3/uL — AB (ref 150–400)
RBC: 4.3 MIL/uL (ref 4.22–5.81)
RDW: 13.8 % (ref 11.5–15.5)
WBC: 6.6 10*3/uL (ref 4.0–10.5)

## 2014-01-17 LAB — MRSA PCR SCREENING: MRSA by PCR: NEGATIVE

## 2014-01-17 LAB — URINE CULTURE
COLONY COUNT: NO GROWTH
Culture: NO GROWTH
Special Requests: NORMAL

## 2014-01-17 MED ORDER — FENTANYL CITRATE 0.05 MG/ML IJ SOLN: 25.0000 ug | INTRAMUSCULAR | Status: DC | PRN

## 2014-01-17 MED ORDER — SODIUM CHLORIDE 0.9 % IV SOLN: INTRAVENOUS | Status: DC

## 2014-01-17 MED ORDER — CIPROFLOXACIN IN D5W 400 MG/200ML IV SOLN
400.0000 mg | Freq: Two times a day (BID) | INTRAVENOUS | Status: DC
  Administered 2014-01-17 – 2014-01-18 (×3): 400 mg via INTRAVENOUS
  Filled 2014-01-17 (×3): qty 200

## 2014-01-17 MED ORDER — NALOXONE HCL 0.4 MG/ML IJ SOLN
INTRAMUSCULAR | Status: AC
  Administered 2014-01-17: 0.4 mg
  Filled 2014-01-17: qty 1

## 2014-01-17 MED ORDER — SODIUM BICARBONATE 8.4 % IV SOLN
INTRAVENOUS | Status: DC
  Administered 2014-01-17 – 2014-01-18 (×3): via INTRAVENOUS
  Filled 2014-01-17 (×6): qty 150

## 2014-01-17 MED ORDER — OXYCODONE-ACETAMINOPHEN 5-325 MG PO TABS
1.0000 | ORAL_TABLET | Freq: Four times a day (QID) | ORAL | Status: DC | PRN
  Administered 2014-01-17 – 2014-01-22 (×15): 1 via ORAL
  Filled 2014-01-17 (×16): qty 1

## 2014-01-17 NOTE — Progress Notes (Signed)
Patient given pain med at 0301, patient talking and wanting more meds. Patient stated he was going to take meds of his own; told patient this was against hospital policy and that home meds need to be sent to pharmacy until he is discharged. I told patient that he had received an increased dose of pain med, Dilaudid 2mg . Patient continued to be upset and stated he was going to talk to his attorney to get his meds change. Patient also stated that he takes more pain med than he is being given at home. He stated he takes Opano, Oxcodone, Xanax lmg 4 times a day and Dilaudid 4 mg. I told the patient I will continue to check and assess his pain and will call the MD needed. SRP, RN

## 2014-01-17 NOTE — Progress Notes (Addendum)
Nutrition Brief Note  Patient identified on the Malnutrition Screening Tool (MST) Report  Wt Readings from Last 15 Encounters:  01/17/14 260 lb 5.8 oz (118.1 kg)  01/15/14 227 lb (102.967 kg)    Body mass index is 52.56 kg/(m^2). Patient meets criteria for Morbid Obesity based on current BMI.   Current diet order is NPO. Labs and medications reviewed.   Pt's family reported pt with poor PO intake < one week. Normally eats very well and has good appetite. Began feeling nauseous on Sunday November 8, and has decreased intake since then. Family unsure if weight loss has occurred; however previous medical records indicate pt positive for weight gain. NPO d/t mental status change, will likely advance as pt becoming more alert and was allowed out of restraints.  Diet advancement per MD. No nutrition interventions warranted at this time. If nutrition issues arise, please consult RD.   Lloyd HugerSarah F Bliss Tsang MS RD LDN Clinical Dietitian Pager:[phone]

## 2014-01-17 NOTE — Progress Notes (Signed)
RT removed PT from BiPAP at approximately 1130- placed on 3 lpm [place]- Sp02 92%. RN and MD aware. Per MD PT to utilize BiPAP QHS- RN aware.

## 2014-01-17 NOTE — Progress Notes (Signed)
Patient ID: Blake Lara, male   DOB: [birth year], 33 y.o.   MRN: [id number]    Subjective: Pt now completely alert and awake.  He was transitioned from BiPAP to nasal canula oxygen earlier and is now on BiPAP tonight per CCM recommendations.  Pt denies SOB, fever, cough. Pain now minimal except for around nephrostomy tube site.  Objective: Vital signs in last 24 hours: Temp:  [97.6 F (36.4 C)-97.7 F (36.5 C)] 97.7 F (36.5 C) (11/12 1618) Pulse Rate:  [77-117] 94 (11/12 1700) Resp:  [19-32] 30 (11/12 1700) BP: (98-127)/(25-78) 101/55 mmHg (11/12 1700) SpO2:  [88 %-100 %] 95 % (11/12 1700) FiO2 (%):  [40 %-80 %] 50 % (11/12 1700) Weight:  [118.1 kg (260 lb 5.8 oz)] 118.1 kg (260 lb 5.8 oz) (11/12 0800)  Intake/Output from previous day: 11/11 0701 - 11/12 0700 In: -  Out: 75 [Urine:75] Intake/Output this shift:    Physical Exam:  General: Alert and oriented CV: RRR Lungs: Clear Abdomen: L PCN draining well with mostly clear/pink urine Ext: NT, No erythema  Lab Results:  Recent Labs  01/15/14 1702 01/16/14 0011  HGB 14.7 12.6*  HCT 43.2 36.2*   CBC Latest Ref Rng 01/16/2014 01/15/2014  WBC 4.0 - 10.5 K/uL 6.6 28.4(H)  Hemoglobin 13.0 - 17.0 g/dL 12.6(L) 14.7  Hematocrit 39.0 - 52.0 % 36.2(L) 43.2  Platelets 150 - 400 K/uL 121(L) 166      BMET  Recent Labs  01/16/14 0011 01/17/14 1607  NA 135* 132*  K 3.4* 4.7  CL 101 99  CO2 19 18*  GLUCOSE 110* 124*  BUN 22 25*  CREATININE 1.80* 2.03*  CALCIUM 8.5 8.2*     Studies/Results: Ct Abdomen Pelvis Wo Contrast  01/16/2014   CLINICAL DATA:  Obstruction of left kidney from ureteral calculus with elevated white blood cell count and fever. The patient presents for left percutaneous nephrostomy. By ultrasound, the left kidney was difficult to visualize and demonstrated some probable hydronephrosis.  EXAM: CT ABDOMEN AND PELVIS WITHOUT CONTRAST  TECHNIQUE: Multidetector CT imaging of the abdomen and pelvis was  performed following the standard protocol without IV contrast.  COMPARISON:  None.  FINDINGS: There is evidence of moderate left-sided hydronephrosis. A 5 mm calculus lies in the posterior lower pole collecting system. The ureter is dilated and can be followed into the pelvis where a distal ureteral calculus is present measuring approximately 10 mm in maximum diameter on the coronal images. This calculus is located fairly close to the expected skin exit site of urine deep in the midline pelvis. The right kidney shows no evidence of hydronephrosis and there is no evidence of right ureteral calculus.  Additional oblong calcification in the anterior pelvis appears to lie outside of bowel and is not a urinary calculus. This measures approximately 12 mm in greatest diameter. Lower midline ventral hernia is present containing bowel. There is no evidence of incarcerated bowel. Additional more superior left-sided ventral hernia contains fat and a left-sided colostomy.  The liver is very vertically oriented and extends well into the pelvis. The spleen is moderately enlarged. No evidence of bowel obstruction or abnormal fluid collection. No free intraperitoneal air is identified. No enlarged lymph nodes or abnormal soft tissue masses are seen. The adrenal glands appear unremarkable. The pancreas appears unremarkable. 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 The visualized lung bases show prominent scarring and atelectasis at both bases.  IMPRESSION: Moderate left hydronephrosis secondary to a calculus in the distal  ureter located deep in the pelvis. This calculus measures 10 mm in greatest length. There is an additional 5 mm calculus in the posterior lower pole of the left kidney.   Electronically Signed   By: Irish LackGlenn  Yamagata M.D.   On: 01/16/2014 21:27     Dg Chest Port 1 View  01/17/2014   CLINICAL DATA:  Difficulty breathing  EXAM: PORTABLE CHEST - 1 VIEW  COMPARISON:  None.   FINDINGS: There is consolidation in the left mid lung. There is patchy atelectasis in the right mid lung and both base regions. Heart size and pulmonary vascularity are within normal limits. No adenopathy. No bone lesions apparent.  IMPRESSION: Areas of patchy atelectasis bilaterally. Left mid lung consolidation.   Electronically Signed   By: Bretta BangWilliam  Woodruff M.D.   On: 01/17/2014 07:58    Assessment/Plan: 1) Left ureteral obstruction/distal ureteral stone/fever: He will continue Cipro (hives with ceftriaxone) as he appears to be responding well.  Cultures from PCN procedure pending. Blood cultures pending. WBC now normalized.  2) Apnea secondary to narcotic pain medication: His episode of apnea earlier today appears to have been related to narcotic pain medication.   I have had a detailed discussion with the patient and his wife today to confirm that despite his medication record, he has not been taking Opana but has only been taking oxycodone at home for the indication of chronic back and hip pain.  His pain medication has now been transitioned to oral oxycodone.  CXR to be checked tomorrow.  If improvement is noted, he can likely be transferred to the floor with continued pulmonary toilet but will defer to CCM recommendations.  3) Renal dysfunction: Unclear if this is chronic or acute with obstruction exacerbating his renal dysfunction.  Baseline Cr is unknown. Considering his urologic history, I would not be surprised if he has CKD. Will monitor.   LOS: 1 day   Kriston Pasquarello,LES 01/17/2014, 7:17 PM

## 2014-01-17 NOTE — Progress Notes (Addendum)
RT called to 1414 for rapid response. Pt had NRB on when RT got to room. Pt started to come around swinging arms. Pt being sent to ICU for further work up. ICU RT has been notified ABG pending.

## 2014-01-17 NOTE — Plan of Care (Signed)
Problem: Phase I Progression Outcomes Goal: Voiding-avoid urinary catheter unless indicated Outcome: Completed/Met Date Met:  01/17/14     

## 2014-01-17 NOTE — Progress Notes (Signed)
Pt refused Bipap for tonight. Pt states he does not want to wear Bipap unless he desats below 90%. Pt is currently on a 50% venturi mask SpO2 93%. RN notified.

## 2014-01-17 NOTE — Progress Notes (Signed)
Subjective: Pt transferred to ICU after apneic/unresponsive event last pm. Events noted. Currently on BiPap   Objective: Physical Exam: BP 106/58 mmHg  Pulse 112  Temp(Src) 97.6 F (36.4 C) (Oral)  Resp 27  Ht 4\' 11"  (1.499 m)  Wt 260 lb 5.8 oz (118.1 kg)  BMI 52.56 kg/m2  SpO2 100% (L)PCN intact, site clean, NT Thin bloody urine output, no clots    Labs: CBC  Recent Labs  01/15/14 1702 01/16/14 0011  WBC 28.4* 6.6  HGB 14.7 12.6*  HCT 43.2 36.2*  PLT 166 121*   BMET  Recent Labs  01/15/14 1702 01/16/14 0011  NA 133* 135*  K 4.5 3.4*  CL 98 101  CO2 18* 19  GLUCOSE 92 110*  BUN 20 22  CREATININE 2.04* 1.80*  CALCIUM 8.9 8.5   LFT  Recent Labs  01/15/14 1702  PROT 7.8  ALBUMIN 3.2*  AST 36  ALT 35  ALKPHOS 93  BILITOT 2.5*  LIPASE 8*    Studies/Results: Ct Abdomen Pelvis Wo Contrast  01/16/2014   CLINICAL DATA:  Obstruction of left kidney from ureteral calculus with elevated white blood cell count and fever. The patient presents for left percutaneous nephrostomy. By ultrasound, the left kidney was difficult to visualize and demonstrated some probable hydronephrosis.  EXAM: CT ABDOMEN AND PELVIS WITHOUT CONTRAST  TECHNIQUE: Multidetector CT imaging of the abdomen and pelvis was performed following the standard protocol without IV contrast.  COMPARISON:  None.  FINDINGS: There is evidence of moderate left-sided hydronephrosis. A 5 mm calculus lies in the posterior lower pole collecting system. The ureter is dilated and can be followed into the pelvis where a distal ureteral calculus is present measuring approximately 10 mm in maximum diameter on the coronal images. This calculus is located fairly close to the expected skin exit site of urine deep in the midline pelvis. The right kidney shows no evidence of hydronephrosis and there is no evidence of right ureteral calculus.  Additional oblong calcification in the anterior pelvis appears to lie outside of  bowel and is not a urinary calculus. This measures approximately 12 mm in greatest diameter. Lower midline ventral hernia is present containing bowel. There is no evidence of incarcerated bowel. Additional more superior left-sided ventral hernia contains fat and a left-sided colostomy.  The liver is very vertically oriented and extends well into the pelvis. The spleen is moderately enlarged. No evidence of bowel obstruction or abnormal fluid collection. No free intraperitoneal air is identified. No enlarged lymph nodes or abnormal soft tissue masses are seen. The adrenal glands appear unremarkable. The pancreas appears unremarkable. 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 The visualized lung bases show prominent scarring and atelectasis at both bases.  IMPRESSION: Moderate left hydronephrosis secondary to a calculus in the distal ureter located deep in the pelvis. This calculus measures 10 mm in greatest length. There is an additional 5 mm calculus in the posterior lower pole of the left kidney.   Electronically Signed   By: Irish LackGlenn  Yamagata M.D.   On: 01/16/2014 21:27   Ir Perc Nephrostomy Left  01/17/2014   CLINICAL DATA:  Left hydronephrosis secondary to distal ureteral catheter. Urinary infection with elevated white blood cell count and fever. History of bladder extrophy.  EXAM: 1. ULTRASOUND GUIDANCE FOR PUNCTURE OF THE LEFT RENAL COLLECTING SYSTEM. 2. LEFT PERCUTANEOUS NEPHROSTOMY TUBE PLACEMENT.  COMPARISON:  CT OF THE ABDOMEN AND PELVIS EARLIER TODAY.  ANESTHESIA/SEDATION: 5.0 mg IV Versed; 100 mcg IV  Fentanyl.  Total Moderate Sedation Time  30 minutes  CONTRAST:  10 ml Omnipaque 300  MEDICATIONS: 400 mg IV Cipro. Ciprofloxacin was given within two hours of incision.  FLUOROSCOPY TIME:  2 minutes and 36 seconds.  PROCEDURE: The procedure, risks, benefits, and alternatives were explained to the patient. Questions regarding the procedure were encouraged and answered. The  patient understands and consents to the procedure. A time-out procedure was performed.  The left flank region was prepped with Betadine in a sterile fashion, and a sterile drape was applied covering the operative field. A sterile gown and sterile gloves were used for the procedure. Local anesthesia was provided with 1% Lidocaine.  Ultrasound was used to localize the left kidney. Under direct ultrasound guidance, a 21 gauge needle was advanced into the renal collecting system. Ultrasound image documentation was performed. Aspiration of urine sample was performed followed by contrast injection. The urine sample was sent for culture analysis.  A transitional dilator was advanced over a guidewire. Percutaneous tract dilatation was then performed over the guidewire. A 10 -French percutaneous nephrostomy tube was then advanced and formed in the collecting system. Catheter position was confirmed by fluoroscopy after contrast injection.  The catheter was secured at the skin with a Prolene retention suture and Stat-Lock device. A gravity bag was placed.  COMPLICATIONS: None.  FINDINGS: Ultrasound demonstrates moderate hydronephrosis. Urine aspirated was mildly turbid. A 10 French catheter was formed at the level of the renal pelvis. After placement, urine return is initially bloody.  IMPRESSION: Placement of 10 French left-sided percutaneous nephrostomy tube to treat hydronephrosis and urinary infection. The tube will be left to gravity drainage. A urine sample was sent for culture analysis.   Electronically Signed   By: Irish Lack M.D.   On: 01/17/2014 08:10   Ir US Guide Bx Asp/drain  01/17/2014   CLINICAL DATA:  Left hydronephrosis secondary to distal ureteral catheter. Urinary infection with elevated white blood cell count and fever. History of bladder extrophy.  EXAM: 1. ULTRASOUND GUIDANCE FOR PUNCTURE OF THE LEFT RENAL COLLECTING SYSTEM. 2. LEFT PERCUTANEOUS NEPHROSTOMY TUBE PLACEMENT.  COMPARISON:  CT OF THE  ABDOMEN AND PELVIS EARLIER TODAY.  ANESTHESIA/SEDATION: 5.0 mg IV Versed; 100 mcg IV Fentanyl.  Total Moderate Sedation Time  30 minutes  CONTRAST:  10 ml Omnipaque 300  MEDICATIONS: 400 mg IV Cipro. Ciprofloxacin was given within two hours of incision.  FLUOROSCOPY TIME:  2 minutes and 36 seconds.  PROCEDURE: The procedure, risks, benefits, and alternatives were explained to the patient. Questions regarding the procedure were encouraged and answered. The patient understands and consents to the procedure. A time-out procedure was performed.  The left flank region was prepped with Betadine in a sterile fashion, and a sterile drape was applied covering the operative field. A sterile gown and sterile gloves were used for the procedure. Local anesthesia was provided with 1% Lidocaine.  Ultrasound was used to localize the left kidney. Under direct ultrasound guidance, a 21 gauge needle was advanced into the renal collecting system. Ultrasound image documentation was performed. Aspiration of urine sample was performed followed by contrast injection. The urine sample was sent for culture analysis.  A transitional dilator was advanced over a guidewire. Percutaneous tract dilatation was then performed over the guidewire. A 10 -French percutaneous nephrostomy tube was then advanced and formed in the collecting system. Catheter position was confirmed by fluoroscopy after contrast injection.  The catheter was secured at the skin with a Prolene retention suture and Stat-Lock  device. A gravity bag was placed.  COMPLICATIONS: None.  FINDINGS: Ultrasound demonstrates moderate hydronephrosis. Urine aspirated was mildly turbid. A 10 French catheter was formed at the level of the renal pelvis. After placement, urine return is initially bloody.  IMPRESSION: Placement of 10 French left-sided percutaneous nephrostomy tube to treat hydronephrosis and urinary infection. The tube will be left to gravity drainage. A urine sample was sent for  culture analysis.   Electronically Signed   By: Irish LackGlenn  Yamagata M.D.   On: 01/17/2014 08:10   Dg Chest Port 1 View  01/17/2014   CLINICAL DATA:  Difficulty breathing  EXAM: PORTABLE CHEST - 1 VIEW  COMPARISON:  None.  FINDINGS: There is consolidation in the left mid lung. There is patchy atelectasis in the right mid lung and both base regions. Heart size and pulmonary vascularity are within normal limits. No adenopathy. No bone lesions apparent.  IMPRESSION: Areas of patchy atelectasis bilaterally. Left mid lung consolidation.   Electronically Signed   By: Bretta BangWilliam  Woodruff M.D.   On: 01/17/2014 07:58    Assessment/Plan: Obstructive uropathy secondary to (L)ureteral stone S/p (L)PCN 11/11 Expect hematuria to clear IR following    LOS: 1 day    Brayton ElBRUNING, Ashanta Amoroso PA-C 01/17/2014 9:23 AM

## 2014-01-17 NOTE — Consult Note (Addendum)
PULMONARY / CRITICAL CARE MEDICINE   Name: Blake Lara MRN: [id number] DOB: 11-Jan-1981    ADMISSION DATE:  01/16/2014 CONSULTATION DATE:  01/17/14  REFERRING MD :  Dr. Laverle PatterBorden  CHIEF COMPLAINT:  Unresponsiveness   INITIAL PRESENTATION: 33 y/o M with PMH of spina bifida and bladder exstrophy s/p multiple urologic procedures admitted on 11/11 with ureteral stone and hydronephrosis.  11/12 developed unresponsiveness after dose of dilaudid for pain @ 3am.  PCCM consulted for evaluation.   STUDIES:  11/11  CT Abd/Pelvis >> mod L hydronephrosis secondary to calculus in distal ureter (10 mm)  SIGNIFICANT EVENTS: 11/11  Admit with ureteral stone & hydronephrosis  11/11  Perc Nephrostomy tube placed.    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  [facility] but last seen at the age of 89.  At baseline he is incontinent and uses absorbent pads.  Reportedly, he was incarcerated for 17 years and did not receive urologic care.    He had approximately 48 hours of L flank pain prior to admit and [facility] and was discharged.  [facility] with similar [facility]. In ER he was noted to have fever of 102, shaking & chills.     Work up showed a WBC of 28k, sr cr of 2.0, hypotension and tachycardia.  He was again discharged home from the ER with outpatient plans for follow up with Urology.    He followed up in the Urology office on 11/11 & was directly admitted by Dr. Laverle PatterBorden for further evaluation.  A repeat CT of the abdomen was notable for moderate L hydronephrosis secondary to a calculus in the distal ureter.  11/12 developed unresponsiveness after dose of dilaudid for pain.  PCCM consulted for evaluation.  The patient was treated with Narcan with some improvement in mental status,  transferred to ICU & placed on bipap.    PAST MEDICAL HISTORY :   has a past medical history of Spina bifida; Dislocated hip; Bladder extrophy; and Ileostomy present.  has past surgical history that includes Abdominal surgery and Colon surgery.   HOME MEDICATIONS:  Prior to Admission medications   Medication Sig Start Date End Date Taking? Authorizing Provider  HYDROmorphone (DILAUDID) 4 MG tablet Take 8 mg by mouth every 4 (four) hours as needed for severe pain.   Yes Historical Provider, MD  ibuprofen (ADVIL,MOTRIN) 600 MG tablet Take 600 mg by mouth every 6 (six) hours as needed for headache.   Yes Historical Provider, MD  oxycodone (ROXICODONE) 30 MG immediate release tablet Take 30 mg by mouth every 4 (four) hours as needed for pain (every 4-6 hrs as needed).   Yes Historical Provider, MD  oxymorphone (OPANA ER) 20 MG 12 hr tablet Take 20 mg by mouth every 12 (twelve) hours.   Yes Historical Provider, MD   Allergies  Allergen Reactions  . Ceftriaxone Hives  . Eggs Or Egg-Derived Products     unknown  . Latex   . Peanut-Containing Drug Products     unknown    FAMILY HISTORY:  has no family status information on file.    SOCIAL HISTORY:  reports that he has been smoking Cigarettes.  He has a .1 pack-year smoking history. He does not have any smokeless tobacco history on file. He reports that  he does not use illicit drugs.  REVIEW OF SYSTEMS:  Unable to Complete as patient is sedate on bipap.   SUBJECTIVE:   VITAL SIGNS: Temp:  [97.3 F (36.3 C)-97.6 F (36.4 C)] 97.6 F (36.4 C) (11/12 0709) Pulse Rate:  [77-117] 117 (11/12 0800) Resp:  [19-32] 32 (11/12 0800) BP: (98-127)/(25-78) 106/58 mmHg (11/12 0800) SpO2:  [94 %-100 %] 95 % (11/12 0800) Weight:  [227 lb (102.967 kg)-260 lb 5.8 oz (118.1 kg)] 260 lb 5.8 oz (118.1 kg) (11/12 0800)   HEMODYNAMICS:     VENTILATOR SETTINGS:     INTAKE / OUTPUT:  Intake/Output Summary (Last 24 hours) at 01/17/14 0817 Last data  filed at 01/16/14 2300  Gross per 24 hour  Intake      0 ml  Output     75 ml  Net    -75 ml    PHYSICAL EXAMINATION: General:  Obese adult male on BiPAP, NAD Neuro:  Drowsy, arouses with verbal stimulation HEENT:  Mm pink/dry, mask in place  Cardiovascular:  s1s2 rrr, no m/r/g, tachy - low 100's Lungs:  resp's even/non-labored, lungs bilaterally clear, pulling 700-900 Vt on BiPAP Abdomen:  Obese/soft, bsx4 active  Musculoskeletal:  Chronic LE changes c/w spina bifida Skin:  Warm/dry, no edema, multiple tattoos  LABS:  CBC  Recent Labs Lab 01/15/14 1702 01/16/14 0011  WBC 28.4* 6.6  HGB 14.7 12.6*  HCT 43.2 36.2*  PLT 166 121*   Coag's No results for input(s): APTT, INR in the last 168 hours. BMET  Recent Labs Lab 01/15/14 1702 01/16/14 0011  NA 133* 135*  K 4.5 3.4*  CL 98 101  CO2 18* 19  BUN 20 22  CREATININE 2.04* 1.80*  GLUCOSE 92 110*   Electrolytes  Recent Labs Lab 01/15/14 1702 01/16/14 0011  CALCIUM 8.9 8.5   Sepsis Markers No results for input(s): LATICACIDVEN, PROCALCITON, O2SATVEN in the last 168 hours. ABG No results for input(s): PHART, PCO2ART, PO2ART in the last 168 hours. Liver Enzymes  Recent Labs Lab 01/15/14 1702  AST 36  ALT 35  ALKPHOS 93  BILITOT 2.5*  ALBUMIN 3.2*   Cardiac Enzymes No results for input(s): TROPONINI, PROBNP in the last 168 hours. Glucose No results for input(s): GLUCAP in the last 168 hours.  Imaging Ct Abdomen Pelvis Wo Contrast  01/16/2014   CLINICAL DATA:  Obstruction of left kidney from ureteral calculus with elevated white blood cell count and fever. The patient presents for left percutaneous nephrostomy. By ultrasound, the left kidney was difficult to visualize and demonstrated some probable hydronephrosis.  EXAM: CT ABDOMEN AND PELVIS WITHOUT CONTRAST  TECHNIQUE: Multidetector CT imaging of the abdomen and pelvis was performed following the standard protocol without IV contrast.  COMPARISON:   None.  FINDINGS: There is evidence of moderate left-sided hydronephrosis. A 5 mm calculus lies in the posterior lower pole collecting system. The ureter is dilated and can be followed into the pelvis where a distal ureteral calculus is present measuring approximately 10 mm in maximum diameter on the coronal images. This calculus is located fairly close to the expected skin exit site of urine deep in the midline pelvis. The right kidney shows no evidence of hydronephrosis and there is no evidence of right ureteral calculus.  Additional oblong calcification in the anterior pelvis appears to lie outside of bowel and is not a urinary calculus. This measures approximately 12 mm in greatest diameter. Lower midline ventral hernia is present containing bowel. There is no  evidence of incarcerated bowel. Additional more superior left-sided ventral hernia contains fat and a left-sided colostomy.  The liver is very vertically oriented and extends well into the pelvis. The spleen is moderately enlarged. No evidence of bowel obstruction or abnormal fluid collection. No free intraperitoneal air is identified. No enlarged lymph nodes or abnormal soft tissue masses are seen. The adrenal glands appear unremarkable. The pancreas appears unremarkable. 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 The visualized lung bases show prominent scarring and atelectasis at both bases.  IMPRESSION: Moderate left hydronephrosis secondary to a calculus in the distal ureter located deep in the pelvis. This calculus measures 10 mm in greatest length. There is an additional 5 mm calculus in the posterior lower pole of the left kidney.   Electronically Signed   By: Irish LackGlenn  Yamagata M.D.   On: 01/16/2014 21:27     ASSESSMENT / PLAN:  PULMONARY OETT A: Acute Respiratory Failure - in setting of narcotic usage, 6 mg in last 48 hours P:   BiPAP with follow up ABG Trend CXR PRN PRN Narcan  Hopeful to avoid  intubation  Minimize narcotics as able   RENAL A:   L Renal Calculi with Hydronephrosis - s/p L perc nephrostomy 11/11  Acute Kidney Injury - in setting of above  Illeostomy Bladder Extrophy Mild Hypokalemia P:   Urology following  Perc Nephrostomy care as instructed by IR / Urology  Dc NS,  bicarb gtt at 125 ml/hr Trend BMP   CARDIOVASCULAR CVL A:  Tachycardia - reactive in setting of pain P:  ICU monitoring   GASTROINTESTINAL A:   At Risk Aspiration  P:   NPO until mental status clears, then advance diet as tolerated   HEMATOLOGIC A:   Mild Anemia Thrombocytopenia  P:  Trend CBC SCD's for DVT prophylaxis   INFECTIOUS A:  UTI - in setting of obstructive stone  P:   BCx2 11/11 >>  UC  11/11 >>   Abx: Cipro, start date 11/11, day 2/x  Monitor fever curve / leukocytosis  Monitor perc neph  ENDOCRINE A:   Hyperglycemia -related D50 push? P:   CBGs Consider SSI if consistently > 180  NEUROLOGIC A:   Acute Encephalopathy - in setting of cumulative effect of narcotics  P:   RASS goal: n/a D/C dilaudid  PRN Narcan for oversedation  Change to PRN Percocet for pain with Fentanyl PRN for severe break through pain  ?? If he was taking additional meds from home Pt inconsistent on home regimen - reportedly takes Opana, xanax + Oxy at home  FAMILY  - Updates: no family available at time of rounds    Canary BrimBrandi Ollis, NP-C [facility] Pgr: [phone] or [phone]   ATTENDING NOTE: I have personally reviewed patient's available data, including medical history, events of note, physical examination and test results as part of my evaluation. I have discussed with resident/NP and other careteam providers such as pharmacist, RN and RRT & co-ordinated with consultants. In addition, I personally evaluated patient and elicited key history of unresponsiveness,adm with left flank pain & fever c/w UTI exam findings of wakes up on calling name,  progressively improved to following commands & labs showing metab acidosis on ABG.  Will use Bipap, improved on recheck already, start bicarb gtt, recheck ABG in 2h   Prefer shorter acting pain meds like fentanyl rather than dilaudid in presence of renal failure Rest per NP/medical resident whose note is outlined above and  that I agree with and edited in full.   The patient is critically ill with multiple organ systems failure and requires high complexity decision making for assessment and support, frequent evaluation and titration of therapies, application of advanced monitoring technologies and extensive interpretation of multiple databases. Critical Care Time devoted by me to patient care services described in this note is 45 minutes.   Oretha Milch MD 01/17/2014, 8:17 AM

## 2014-01-17 NOTE — Care Management Note (Signed)
    Page 1 of 2   01/21/2014     12:51:25 PM CARE MANAGEMENT NOTE 01/21/2014  Patient:  Blake Lara,Blake Lara   Account Number:  [id number]  Date Initiated:  01/17/2014  Documentation initiated by:  DAVIS,RHONDA  Subjective/Objective Assessment:   pt admitted for pain control/radpid response called due to patient lethgaric and decreased o2 sat/transferred to sdu and placed on bipap.     Action/Plan:   home when stable   Anticipated DC Date:  01/24/2014   Anticipated DC Plan:  HOME/SELF CARE  In-house referral  Clinical Social Worker      DC Planning Services  CM consult      [facility]  NA   Choice offered to / List presented to:  NA   DME arranged  NA      DME agency  NA     HH arranged  NA      HH agency  NA   Status of service:  In process, will continue to follow Medicare Important Message given?   (If response is "NO", the following Medicare IM given date fields will be blank) Date Medicare IM given:   Medicare IM given by:   Date Additional Medicare IM given:   Additional Medicare IM given by:    Discharge Disposition:    Per UR Regulation:  Reviewed for med. necessity/level of care/duration of stay  If discussed at Long Length of Stay Meetings, dates discussed:    Comments:  [id number]/Rhonda Earlene Plateravis, RN, BSN, CCM Chart reviewed. Discharge needs and patient's stay to be reviewed and followed by case manager. Chart note for progression of stay: Acute Respiratory Failure - in setting of narcotic cumulative effect, resolved   Bilateral HCAP vs Pulm edema - LLL with airspace disease, favor aspiration Probable OSA Probable Aspiration/Advance diet as tolerated/  pt refused SLP/no MRSA, dc vanc/Nephrostomy in place Tx to tele & TR[facility] medical SVC as of 11/17 0700 am.   01/17/2014/Rhonda L. Earlene Plateravis, RN, BSN, CCM: Chart review for medical necessity and patient discharge needs. Case Manager will follow for patient condition changes.

## 2014-01-17 NOTE — Plan of Care (Signed)
Problem: Phase I Progression Outcomes Goal: Pain controlled with appropriate interventions Outcome: Progressing     

## 2014-01-17 NOTE — Progress Notes (Signed)
Patient sleeping.  Without distress.  Asked NT to not to wake him to make him her last patient to check this am. Will cont to monitor patient. SRP, RN

## 2014-01-17 NOTE — Progress Notes (Signed)
ANTIBIOTIC CONSULT NOTE - INITIAL  Pharmacy Consult for Antibiotic renal dose adjustment Indication: Cipro  Allergies  Allergen Reactions  . Ceftriaxone Hives  . Eggs Or Egg-Derived Products     unknown  . Latex   . Peanut-Containing Drug Products     unknown   Labs:  Recent Labs  01/15/14 1702 01/16/14 0011  WBC 28.4* 6.6  HGB 14.7 12.6*  PLT 166 121*  CREATININE 2.04* 1.80*   Estimated Creatinine Clearance: 57.6 mL/min (by C-G formula based on Cr of 1.8).  Microbiology: No results found for this or any previous visit (from the past 720 hour(s)).  Assessment: [age] yoM admitted on 11/11 with severe flank pain, N/V, and known obstructing ureteral calculus and nonobstructing renal calculi.  PMH significant for spina bifida, bladder exstrophy s/p multiple urological procedures.  Now s/p left perc nephrostomy placed 11/11 PM.  Pharmacy is consulted for antibiotic renal dose adjustment.  11/11 >> ceftriaxone >> 11/11 11/12 >> cipro >>    Tmax: afebrile WBCs: Improved (28 > 6.6) Renal:  Improved, SCr 2.04 > 1.8 with CrCl ~ 57 Blood and urine cultures pending.  Goal of Therapy:  Appropriate abx dosing, eradication of infection.   Plan:   Increase to Cipro 400mg  IV q12h  Follow up renal function and cultures as available.  Lynann Beaverhristine Ramzey Petrovic PharmD, BCPS Pager [phone] 01/17/2014 7:49 AM

## 2014-01-17 NOTE — Progress Notes (Signed)
CSW received referral for Advanced Directives.  CSW visited pt room and pt sleeping soundly at this time. No family present at bedside.   CSW spoke with RN who reports that pt has a wife who has been at bedside, but not currently there.   CSW left Advanced Directives packet at bedside.   CSW to follow up to further discuss Advanced Directives and complete full psychosocial assessment at that time.  Loletta SpecterSuzanna Jayr Lupercio, MSW, LCSW Clinical Social Work [phone]

## 2014-01-17 NOTE — Progress Notes (Signed)
NT in taking patient VSS, noted low sats and asked me to come in to check patient at 7:05. Patient with agonal respirations, and unable to arouse. Narcan given to patient. Patient begin to wake up, Second dose of Narcan given, patient arousable and agitated. Rapid Response team called. MD notified, Patient stablized and sent to CCU/SD.  Report given to Crown HoldingsJacqueline Thigpen.  SRP, RN

## 2014-01-17 NOTE — Progress Notes (Signed)
Patient ID: Blake Lara, male   DOB: [birth year], 33 y.o.   MRN: [id number]  Pt was found to be apneic and unresponsive this morning.  He had received 2 mg of Dilaudid at 3 AM after complaining of persistent pain.  He was administered Narcan and his oxygen saturation quickly improved and he became more responsive with oxygen saturation over 90%.  I spoke with his family member.  There was some concern by the nursing staff that he may have taken his own oral pain medication in addition to what was ordered to be administered by hospital staff.  His family member did not think he had taken additional pain medication.  It was confirmed that he has not been taking Opana contrary to what his admission MAR stated.  He has been taking oxycodone prn.  It is unclear who has been prescribing this medication or what indication it was for prior to his kidney stone event.    He will be transferred to the step down ICU for monitoring of his respiratory status.  CXR ordered.

## 2014-01-17 NOTE — Progress Notes (Signed)
Patient's restraints were discontinued at 1000. Patient is now alert and orientated. Patient is responding to following commands. He is not interfering with his care at this time. Will continue to monitor patient.

## 2014-01-18 ENCOUNTER — Inpatient Hospital Stay (HOSPITAL_COMMUNITY): Payer: Medicaid Other

## 2014-01-18 DIAGNOSIS — N179 Acute kidney failure, unspecified: Secondary | ICD-10-CM

## 2014-01-18 DIAGNOSIS — N1330 Unspecified hydronephrosis: Secondary | ICD-10-CM | POA: Insufficient documentation

## 2014-01-18 LAB — CBC
HEMATOCRIT: 36 % — AB (ref 39.0–52.0)
Hemoglobin: 11.9 g/dL — ABNORMAL LOW (ref 13.0–17.0)
MCH: 28.5 pg (ref 26.0–34.0)
MCHC: 33.1 g/dL (ref 30.0–36.0)
MCV: 86.3 fL (ref 78.0–100.0)
Platelets: 111 10*3/uL — ABNORMAL LOW (ref 150–400)
RBC: 4.17 MIL/uL — ABNORMAL LOW (ref 4.22–5.81)
RDW: 14 % (ref 11.5–15.5)
WBC: 8.9 10*3/uL (ref 4.0–10.5)

## 2014-01-18 LAB — BASIC METABOLIC PANEL
Anion gap: 10 (ref 5–15)
BUN: 20 mg/dL (ref 6–23)
CALCIUM: 8.2 mg/dL — AB (ref 8.4–10.5)
CO2: 31 mEq/L (ref 19–32)
CREATININE: 1.52 mg/dL — AB (ref 0.50–1.35)
Chloride: 97 mEq/L (ref 96–112)
GFR calc Af Amer: 68 mL/min — ABNORMAL LOW (ref >90)
GFR calc non Af Amer: 59 mL/min — ABNORMAL LOW (ref >90)
Glucose, Bld: 122 mg/dL — ABNORMAL HIGH (ref 70–99)
Potassium: 3.2 mEq/L — ABNORMAL LOW (ref 3.7–5.3)
Sodium: 138 mEq/L (ref 137–147)

## 2014-01-18 MED ORDER — POTASSIUM CHLORIDE CRYS ER 20 MEQ PO TBCR
40.0000 meq | EXTENDED_RELEASE_TABLET | Freq: Once | ORAL | Status: DC
  Filled 2014-01-18: qty 2

## 2014-01-18 MED ORDER — LEVOFLOXACIN IN D5W 750 MG/150ML IV SOLN
750.0000 mg | INTRAVENOUS | Status: DC
  Administered 2014-01-18 – 2014-01-22 (×5): 750 mg via INTRAVENOUS
  Filled 2014-01-18 (×7): qty 150

## 2014-01-18 MED ORDER — VANCOMYCIN HCL IN DEXTROSE 1-5 GM/200ML-% IV SOLN
1000.0000 mg | Freq: Two times a day (BID) | INTRAVENOUS | Status: DC
  Administered 2014-01-18 – 2014-01-19 (×3): 1000 mg via INTRAVENOUS
  Filled 2014-01-18 (×3): qty 200

## 2014-01-18 MED ORDER — POTASSIUM CHLORIDE CRYS ER 20 MEQ PO TBCR
40.0000 meq | EXTENDED_RELEASE_TABLET | Freq: Once | ORAL | Status: AC
  Administered 2014-01-18: 40 meq via ORAL
  Filled 2014-01-18: qty 2

## 2014-01-18 MED ORDER — POTASSIUM CHLORIDE 20 MEQ PO PACK: 40.0000 meq | PACK | Freq: Once | ORAL | Status: DC

## 2014-01-18 MED ORDER — POTASSIUM CHLORIDE 20 MEQ PO PACK
40.0000 meq | PACK | Freq: Once | ORAL | Status: DC
  Filled 2014-01-18: qty 2

## 2014-01-18 MED ORDER — SODIUM CHLORIDE 0.9 % IV SOLN
INTRAVENOUS | Status: DC
  Administered 2014-01-18 – 2014-01-23 (×2): via INTRAVENOUS

## 2014-01-18 MED ORDER — VANCOMYCIN HCL 10 G IV SOLR
2000.0000 mg | Freq: Once | INTRAVENOUS | Status: AC
  Administered 2014-01-18: 2000 mg via INTRAVENOUS
  Filled 2014-01-18 (×2): qty 2000

## 2014-01-18 NOTE — Progress Notes (Signed)
eLink Physician-Brief Progress Note Patient Name: Blake Lara DOB: 07-07-1980 MRN: [id number]   Date of Service  01/18/2014  HPI/Events of Note    eICU Interventions  K+ replaced     Intervention Category Intermediate Interventions: Electrolyte abnormality - evaluation and management  Raghav Verrilli S. 01/18/2014, 6:20 AM

## 2014-01-18 NOTE — Progress Notes (Signed)
Pt refused qhs CPAP. Pt states he does not normally wear CPAP and prefers to wear 45% Venturi mask. RN notified. Pt is currently wearing a 6L nasal cannula SpO2 93%. RT will continue to monitor.

## 2014-01-18 NOTE — Progress Notes (Signed)
Pt with k+of 3.2. Pt unable to take po potassium. Called urology and spoke with md on call and stated to call critical care MD to get order to change po potassium to iv or liquid. Called critical care md and stated to wait until rounding team comes by to assess pt and inform them of pt's potassium and pt unable to take po.  Informed on coming shift RN of this.

## 2014-01-18 NOTE — Progress Notes (Signed)
Asked pt several times throughout the night if he wanted to be turned or changed. Pt refused. Will pass on to oncoming shift.

## 2014-01-18 NOTE — Progress Notes (Signed)
Patient ID: Blake Lara, male   DOB: 09-03-1980, 33 y.o.   MRN: [id number]    Subjective: Pt feeling much improved.  Pain is minimal.  No SOB or cough.  Oxygen requirement decreasing.  Now on nasal cannula.  Objective: Vital signs in last 24 hours: Temp:  [97.4 F (36.3 C)-98.2 F (36.8 C)] 97.4 F (36.3 C) (11/13 0800) Pulse Rate:  [88-103] 99 (11/13 1100) Resp:  [19-36] 35 (11/13 1100) BP: (88-122)/(29-58) 122/47 mmHg (11/13 1100) SpO2:  [88 %-96 %] 92 % (11/13 1100) FiO2 (%):  [50 %] 50 % (11/12 1900)  Intake/Output from previous day: 11/12 0701 - 11/13 0700 In: 3000 [P.O.:480; I.V.:2100; IV Piggyback:400] Out: 425 [Urine:425] Intake/Output this shift: Total I/O In: 93.8 [I.V.:83.8; Other:10] Out: 250 [Urine:250]  Physical Exam:  General: Alert and oriented CV: RRR Lungs: Clear Abdomen: Soft, NT, PCN draining grossly clear urine  Lab Results:  Recent Labs  01/15/14 1702 01/16/14 0011 01/18/14 0340  HGB 14.7 12.6* 11.9*  HCT 43.2 36.2* 36.0*   CBC Latest Ref Rng 01/18/2014 01/16/2014 01/15/2014  WBC 4.0 - 10.5 K/uL 8.9 6.6 28.4(H)  Hemoglobin 13.0 - 17.0 g/dL 11.9(L) 12.6(L) 14.7  Hematocrit 39.0 - 52.0 % 36.0(L) 36.2(L) 43.2  Platelets 150 - 400 K/uL 111(L) 121(L) 166    Recent Results (from the past 240 hour(s))  Culture, blood (routine x 2)     Status: None (Preliminary result)   Collection Time: 01/16/14 12:16 AM  Result Value Ref Range Status   Specimen Description BLOOD LEFT HAND  Final   Special Requests BOTTLES DRAWN AEROBIC ONLY 2ML  Final   Culture  Setup Time   Final    01/17/2014 03:16 Performed at [facility]    Culture   Final           BLOOD CULTURE RECEIVED NO GROWTH TO DATE CULTURE WILL BE HELD FOR 5 DAYS BEFORE ISSUING A FINAL NEGATIVE REPORT Performed at [facility]    Report Status PENDING  Incomplete  Urine culture     Status: None   Collection Time: 01/16/14  9:11 PM  Result Value Ref Range Status   Specimen  Description   Final    URINE, CATHETERIZED LEFT RENAL PELVIS AFTER DIRECT STICK   Special Requests Normal  Final   Culture  Setup Time   Final    01/17/2014 01:27 Performed at [facility]    Colony Count NO GROWTH Performed at [facility]   Final   Culture NO GROWTH Performed at [facility]   Final   Report Status 01/17/2014 FINAL  Final  Culture, blood (routine x 2)     Status: None (Preliminary result)   Collection Time: 01/17/14 12:10 AM  Result Value Ref Range Status   Specimen Description BLOOD RIGHT FOREARM  Final   Special Requests BOTTLES DRAWN AEROBIC ONLY 3ML  Final   Culture  Setup Time   Final    01/17/2014 03:16 Performed at [facility]    Culture   Final           BLOOD CULTURE RECEIVED NO GROWTH TO DATE CULTURE WILL BE HELD FOR 5 DAYS BEFORE ISSUING A FINAL NEGATIVE REPORT Performed at [facility]    Report Status PENDING  Incomplete  MRSA PCR Screening     Status: None   Collection Time: 01/17/14  8:14 AM  Result Value Ref Range Status   MRSA by PCR NEGATIVE NEGATIVE Final  Comment:        The GeneXpert MRSA Assay (FDA approved for NASAL specimens only), is one component of a comprehensive MRSA colonization surveillance program. It is not intended to diagnose MRSA infection nor to guide or monitor treatment for MRSA infections.     BMET  Recent Labs  01/17/14 1607 01/18/14 0340  NA 132* 138  K 4.7 3.2*  CL 99 97  CO2 18* 31  GLUCOSE 124* 122*  BUN 25* 20  CREATININE 2.03* 1.52*  CALCIUM 8.2* 8.2*     Studies/Results:   Dg Chest Port 1 View  01/18/2014   CLINICAL DATA:  Respiratory failure neck  EXAM: PORTABLE CHEST - 1 VIEW  COMPARISON:  Portable chest x-ray of January 17, 2014  FINDINGS: The lungs are hypoinflated. Confluent alveolar opacities have developed throughout the right lung and with in the left perihilar region. The interstitial markings are prominent diffusely. The cardiac  silhouette is indistinct. There is no pleural effusion or pneumothorax. There is chronic curvature of the mid thoracic spine and S-shaped configuration.  IMPRESSION: Significant change in the appearance of the chest since yesterday with findings consistent with bilateral alveolar pneumonia or other alveolar filling processes including pulmonary edema.   Electronically Signed   By: David  Swazil[place]Jordan   On: 01/18/2014 07:41   Dg Chest Port 1 View  01/17/2014   CLINICAL DATA:  Difficulty breathing  EXAM: PORTABLE CHEST - 1 VIEW  COMPARISON:  None.  FINDINGS: There is consolidation in the left mid lung. There is patchy atelectasis in the right mid lung and both base regions. Heart size and pulmonary vascularity are within normal limits. No adenopathy. No bone lesions apparent.  IMPRESSION: Areas of patchy atelectasis bilaterally. Left mid lung consolidation.   Electronically Signed   By: Bretta BangWilliam  Woodruff M.D.   On: 01/17/2014 07:58    Assessment/Plan:  1) Left ureteral obstruction/distal ureteral stone: S/P L PCN draining well.  Negative urine culture from nephrostomy procedure indicates that maybe he did not have a renal infection.  However, will continue antibiotic course empirically (now on Levaquin).  Have begun to discuss definitive treatment options considering the complexity of his urologic anatomy.  Options include referral to [facility] (where he has received prior urologic care) or [facility] possible ureteroscopic therapy either retrograde or antegrade as indicated. Would not proceed with therapy until 10-14 days of antibiotic therapy. 2) Possible pneumonia/aspiration:  He is clinically improved from a pulmonary standpoint but his CXR is concerning.  I have spoken with Dr. Vassie LollAlva this morning.  He would like him to remain in the step down ICU for 24 hrs and has changed his antibiotic to Levaquin which should offer empiric coverage for pneumonia and renal infection.  I will defer further  recommendations to CCM/pulmonology for disposition in preparation from discharge. He will be ready for discharge with his percutaneous nephrostomy tube once stable from a pulmonary standpoint.    LOS: 2 days   Kallon Caylor,LES 01/18/2014, 11:51 AM

## 2014-01-18 NOTE — Progress Notes (Signed)
Patient ID: Blake Lara, male   DOB: 1980-11-23, 33 y.o.   MRN: [id number]   Referring Physician(s):  Dr. Laverle PatterBorden  Subjective:  Pt feeling a liitle better today; still with occ prod cough; mild left flank discomfort  Allergies: Ceftriaxone; Eggs or egg-derived products; Latex; and Peanut-containing drug products  Medications: Prior to Admission medications   Medication Sig Start Date End Date Taking? Authorizing Provider  HYDROmorphone (DILAUDID) 4 MG tablet Take 8 mg by mouth every 4 (four) hours as needed for severe pain.   Yes Historical Provider, MD  ibuprofen (ADVIL,MOTRIN) 600 MG tablet Take 600 mg by mouth every 6 (six) hours as needed for headache.   Yes Historical Provider, MD  oxycodone (ROXICODONE) 30 MG immediate release tablet Take 30 mg by mouth every 4 (four) hours as needed for pain (every 4-6 hrs as needed).   Yes Historical Provider, MD  oxymorphone (OPANA ER) 20 MG 12 hr tablet Take 20 mg by mouth every 12 (twelve) hours.   Yes Historical Provider, MD    Review of Systems see above  Vital Signs: BP 122/47 mmHg  Pulse 99  Temp(Src) 98.6 F (37 C) (Oral)  Resp 35  Ht 4\' 11"  (1.499 m)  Wt 260 lb 5.8 oz (118.1 kg)  BMI 52.56 kg/m2  SpO2 92%  Physical Exam left PCN intact, output 250 cc's amber urine; blood cx pend; urine cx neg  Imaging: Ct Abdomen Pelvis Wo Contrast  01/16/2014   CLINICAL DATA:  Obstruction of left kidney from ureteral calculus with elevated white blood cell count and fever. The patient presents for left percutaneous nephrostomy. By ultrasound, the left kidney was difficult to visualize and demonstrated some probable hydronephrosis.  EXAM: CT ABDOMEN AND PELVIS WITHOUT CONTRAST  TECHNIQUE: Multidetector CT imaging of the abdomen and pelvis was performed following the standard protocol without IV contrast.  COMPARISON:  None.  FINDINGS: There is evidence of moderate left-sided hydronephrosis. A 5 mm calculus lies in the posterior lower pole  collecting system. The ureter is dilated and can be followed into the pelvis where a distal ureteral calculus is present measuring approximately 10 mm in maximum diameter on the coronal images. This calculus is located fairly close to the expected skin exit site of urine deep in the midline pelvis. The right kidney shows no evidence of hydronephrosis and there is no evidence of right ureteral calculus.  Additional oblong calcification in the anterior pelvis appears to lie outside of bowel and is not a urinary calculus. This measures approximately 12 mm in greatest diameter. Lower midline ventral hernia is present containing bowel. There is no evidence of incarcerated bowel. Additional more superior left-sided ventral hernia contains fat and a left-sided colostomy.  The liver is very vertically oriented and extends well into the pelvis. The spleen is moderately enlarged. No evidence of bowel obstruction or abnormal fluid collection. No free intraperitoneal air is identified. No enlarged lymph nodes or abnormal soft tissue masses are seen. The adrenal glands appear unremarkable. The pancreas appears unremarkable. 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 The visualized lung bases show prominent scarring and atelectasis at both bases.  IMPRESSION: Moderate left hydronephrosis secondary to a calculus in the distal ureter located deep in the pelvis. This calculus measures 10 mm in greatest length. There is an additional 5 mm calculus in the posterior lower pole of the left kidney.   Electronically Signed   By: Irish LackGlenn  Yamagata M.D.   On: 01/16/2014 21:27   Ir Perc  Nephrostomy Left  01/17/2014   CLINICAL DATA:  Left hydronephrosis secondary to distal ureteral catheter. Urinary infection with elevated white blood cell count and fever. History of bladder extrophy.  EXAM: 1. ULTRASOUND GUIDANCE FOR PUNCTURE OF THE LEFT RENAL COLLECTING SYSTEM. 2. LEFT PERCUTANEOUS NEPHROSTOMY TUBE  PLACEMENT.  COMPARISON:  CT OF THE ABDOMEN AND PELVIS EARLIER TODAY.  ANESTHESIA/SEDATION: 5.0 mg IV Versed; 100 mcg IV Fentanyl.  Total Moderate Sedation Time  30 minutes  CONTRAST:  10 ml Omnipaque 300  MEDICATIONS: 400 mg IV Cipro. Ciprofloxacin was given within two hours of incision.  FLUOROSCOPY TIME:  2 minutes and 36 seconds.  PROCEDURE: The procedure, risks, benefits, and alternatives were explained to the patient. Questions regarding the procedure were encouraged and answered. The patient understands and consents to the procedure. A time-out procedure was performed.  The left flank region was prepped with Betadine in a sterile fashion, and a sterile drape was applied covering the operative field. A sterile gown and sterile gloves were used for the procedure. Local anesthesia was provided with 1% Lidocaine.  Ultrasound was used to localize the left kidney. Under direct ultrasound guidance, a 21 gauge needle was advanced into the renal collecting system. Ultrasound image documentation was performed. Aspiration of urine sample was performed followed by contrast injection. The urine sample was sent for culture analysis.  A transitional dilator was advanced over a guidewire. Percutaneous tract dilatation was then performed over the guidewire. A 10 -French percutaneous nephrostomy tube was then advanced and formed in the collecting system. Catheter position was confirmed by fluoroscopy after contrast injection.  The catheter was secured at the skin with a Prolene retention suture and Stat-Lock device. A gravity bag was placed.  COMPLICATIONS: None.  FINDINGS: Ultrasound demonstrates moderate hydronephrosis. Urine aspirated was mildly turbid. A 10 French catheter was formed at the level of the renal pelvis. After placement, urine return is initially bloody.  IMPRESSION: Placement of 10 French left-sided percutaneous nephrostomy tube to treat hydronephrosis and urinary infection. The tube will be left to gravity  drainage. A urine sample was sent for culture analysis.   Electronically Signed   By: Irish Lack M.D.   On: 01/17/2014 08:10   Ir US Guide Bx Asp/drain  01/17/2014   CLINICAL DATA:  Left hydronephrosis secondary to distal ureteral catheter. Urinary infection with elevated white blood cell count and fever. History of bladder extrophy.  EXAM: 1. ULTRASOUND GUIDANCE FOR PUNCTURE OF THE LEFT RENAL COLLECTING SYSTEM. 2. LEFT PERCUTANEOUS NEPHROSTOMY TUBE PLACEMENT.  COMPARISON:  CT OF THE ABDOMEN AND PELVIS EARLIER TODAY.  ANESTHESIA/SEDATION: 5.0 mg IV Versed; 100 mcg IV Fentanyl.  Total Moderate Sedation Time  30 minutes  CONTRAST:  10 ml Omnipaque 300  MEDICATIONS: 400 mg IV Cipro. Ciprofloxacin was given within two hours of incision.  FLUOROSCOPY TIME:  2 minutes and 36 seconds.  PROCEDURE: The procedure, risks, benefits, and alternatives were explained to the patient. Questions regarding the procedure were encouraged and answered. The patient understands and consents to the procedure. A time-out procedure was performed.  The left flank region was prepped with Betadine in a sterile fashion, and a sterile drape was applied covering the operative field. A sterile gown and sterile gloves were used for the procedure. Local anesthesia was provided with 1% Lidocaine.  Ultrasound was used to localize the left kidney. Under direct ultrasound guidance, a 21 gauge needle was advanced into the renal collecting system. Ultrasound image documentation was performed. Aspiration of urine sample was performed  followed by contrast injection. The urine sample was sent for culture analysis.  A transitional dilator was advanced over a guidewire. Percutaneous tract dilatation was then performed over the guidewire. A 10 -French percutaneous nephrostomy tube was then advanced and formed in the collecting system. Catheter position was confirmed by fluoroscopy after contrast injection.  The catheter was secured at the skin with a  Prolene retention suture and Stat-Lock device. A gravity bag was placed.  COMPLICATIONS: None.  FINDINGS: Ultrasound demonstrates moderate hydronephrosis. Urine aspirated was mildly turbid. A 10 French catheter was formed at the level of the renal pelvis. After placement, urine return is initially bloody.  IMPRESSION: Placement of 10 French left-sided percutaneous nephrostomy tube to treat hydronephrosis and urinary infection. The tube will be left to gravity drainage. A urine sample was sent for culture analysis.   Electronically Signed   By: Irish LackGlenn  Yamagata M.D.   On: 01/17/2014 08:10   Dg Chest Port 1 View  01/18/2014   CLINICAL DATA:  Respiratory failure neck  EXAM: PORTABLE CHEST - 1 VIEW  COMPARISON:  Portable chest x-ray of January 17, 2014  FINDINGS: The lungs are hypoinflated. Confluent alveolar opacities have developed throughout the right lung and with in the left perihilar region. The interstitial markings are prominent diffusely. The cardiac silhouette is indistinct. There is no pleural effusion or pneumothorax. There is chronic curvature of the mid thoracic spine and S-shaped configuration.  IMPRESSION: Significant change in the appearance of the chest since yesterday with findings consistent with bilateral alveolar pneumonia or other alveolar filling processes including pulmonary edema.   Electronically Signed   By: David  SwazilandJordan   On: 01/18/2014 07:41   Dg Chest Port 1 View  01/17/2014   CLINICAL DATA:  Difficulty breathing  EXAM: PORTABLE CHEST - 1 VIEW  COMPARISON:  None.  FINDINGS: There is consolidation in the left mid lung. There is patchy atelectasis in the right mid lung and both base regions. Heart size and pulmonary vascularity are within normal limits. No adenopathy. No bone lesions apparent.  IMPRESSION: Areas of patchy atelectasis bilaterally. Left mid lung consolidation.   Electronically Signed   By: Bretta BangWilliam  Woodruff M.D.   On: 01/17/2014 07:58    Labs:  CBC:  Recent  Labs  01/15/14 1702 01/16/14 0011 01/18/14 0340  WBC 28.4* 6.6 8.9  HGB 14.7 12.6* 11.9*  HCT 43.2 36.2* 36.0*  PLT 166 121* 111*    COAGS: No results for input(s): INR, APTT in the last 8760 hours.  BMP:  Recent Labs  01/15/14 1702 01/16/14 0011 01/17/14 1607 01/18/14 0340  NA 133* 135* 132* 138  K 4.5 3.4* 4.7 3.2*  CL 98 101 99 97  CO2 18* 19 18* 31  GLUCOSE 92 110* 124* 122*  BUN 20 22 25* 20  CALCIUM 8.9 8.5 8.2* 8.2*  CREATININE 2.04* 1.80* 2.03* 1.52*  GFRNONAA 41* 48* 41* 59*  GFRAA 48* 55* 48* 68*    LIVER FUNCTION TESTS:  Recent Labs  01/15/14 1702  BILITOT 2.5*  AST 36  ALT 35  ALKPHOS 93  PROT 7.8  ALBUMIN 3.2*    Assessment and Plan: S/p left PCN 11/11 secondary to obst hydro from distal ureteral stone; WBC nl; creat 1.52 (2.03); replace K; CXR c/w PNA vs edema; plans as outlined by CCM/urology.      I spent a total of 15 minutes face to face in clinical consultation/evaluation, greater than 50% of which was counseling/coordinating care for left perc nephrostomy.  Signed: Chinita Pester 01/18/2014, 4:17 PM

## 2014-01-18 NOTE — Consult Note (Signed)
PULMONARY / CRITICAL CARE MEDICINE   Name: Blake Lara MRN: [id number] DOB: [birth year]    ADMISSION DATE:  01/16/2014 CONSULTATION DATE:  01/17/14  REFERRING MD :  Dr. Laverle PatterBorden  CHIEF COMPLAINT:  Unresponsiveness   INITIAL PRESENTATION: 33 y/o M with PMH of spina bifida and bladder exstrophy s/p multiple urologic procedures admitted on 11/11 with ureteral stone and hydronephrosis.  11/12 developed unresponsiveness after dose of dilaudid for pain @ 3am.  PCCM consulted for evaluation.   STUDIES:  11/11  CT Abd/Pelvis >> mod L hydronephrosis secondary to calculus in distal ureter (10 mm)  SIGNIFICANT EVENTS: 11/11  Admit with ureteral stone & hydronephrosis  11/11  Perc Nephrostomy tube placed.  11/12 hypoxia , resp distress, resolved with bipap   SUBJECTIVE: Feeling much better with less back pain and awake and alert this am  Off BIPAP   VITAL SIGNS: Temp:  [97.4 F (36.3 C)-98.2 F (36.8 C)] 97.4 F (36.3 C) (11/13 0800) Pulse Rate:  [89-103] 93 (11/13 0400) Resp:  [24-36] 36 (11/13 0400) BP: (88-122)/(29-58) 100/38 mmHg (11/13 0400) SpO2:  [88 %-96 %] 90 % (11/13 0400) FiO2 (%):  [40 %-50 %] 50 % (11/12 1900)   HEMODYNAMICS:     VENTILATOR SETTINGS: Vent Mode:  [-]  FiO2 (%):  [40 %-50 %] 50 %   INTAKE / OUTPUT:  Intake/Output Summary (Last 24 hours) at 01/18/14 0919 Last data filed at 01/18/14 0400  Gross per 24 hour  Intake   2800 ml  Output    425 ml  Net   2375 ml    PHYSICAL EXAMINATION: General:  Obese adult male, awake and alert  Neuro:  Awake and alert , Following commands and appropriate  HEENT:  Mm pink/dry Cardiovascular:  s1s2 rrr, no m/r/g, Lungs:  resp's even/non-labored, lungs bilaterally clear  Abdomen:  Obese/soft, bsx4 active  Musculoskeletal:  Chronic LE changes c/w spina bifida Skin:  Warm/dry, no edema, multiple tattoos  LABS:  CBC  Recent Labs Lab 01/15/14 1702 01/16/14 0011 01/18/14 0340  WBC 28.4* 6.6 8.9  HGB 14.7  12.6* 11.9*  HCT 43.2 36.2* 36.0*  PLT 166 121* 111*   Coag's No results for input(s): APTT, INR in the last 168 hours. BMET  Recent Labs Lab 01/16/14 0011 01/17/14 1607 01/18/14 0340  NA 135* 132* 138  K 3.4* 4.7 3.2*  CL 101 99 97  CO2 19 18* 31  BUN 22 25* 20  CREATININE 1.80* 2.03* 1.52*  GLUCOSE 110* 124* 122*   Electrolytes  Recent Labs Lab 01/16/14 0011 01/17/14 1607 01/18/14 0340  CALCIUM 8.5 8.2* 8.2*   Sepsis Markers No results for input(s): LATICACIDVEN, PROCALCITON, O2SATVEN in the last 168 hours. ABG  Recent Labs Lab 01/17/14 0855 01/17/14 1533  PHART 7.203* 7.303*  PCO2ART 43.7 46.7*  PO2ART 122.0* 53.0*   Liver Enzymes  Recent Labs Lab 01/15/14 1702  AST 36  ALT 35  ALKPHOS 93  BILITOT 2.5*  ALBUMIN 3.2*   Cardiac Enzymes No results for input(s): TROPONINI, PROBNP in the last 168 hours. Glucose  Recent Labs Lab 01/17/14 0713  GLUCAP 263*    Imaging Dg Chest Port 1 View  01/17/2014   CLINICAL DATA:  Difficulty breathing  EXAM: PORTABLE CHEST - 1 VIEW  COMPARISON:  None.  FINDINGS: There is consolidation in the left mid lung. There is patchy atelectasis in the right mid lung and both base regions. Heart size and pulmonary vascularity are within normal limits. No adenopathy. No bone lesions  apparent.  IMPRESSION: Areas of patchy atelectasis bilaterally. Left mid lung consolidation.   Electronically Signed   By: Bretta BangWilliam  Woodruff M.D.   On: 01/17/2014 07:58     ASSESSMENT / PLAN:  PULMONARY OETT A: Acute Respiratory Failure - in setting of narcotic usage>resolved  Bilateral HCAP vs Pulm edema  Probable OSA P:   Check cxr in am  Begin IV abx  Minimize narcotics as able  Check BNP  Noct CPAP for now - will need sleep study as outpt to clarify  RENAL A:   L Renal Calculi with Hydronephrosis - s/p L perc nephrostomy 11/11  Acute Kidney Injury - in setting of above  Illeostomy Bladder Extrophy Hypokalemia P:   Urology  following  Perc Nephrostomy care as instructed by IR / Urology  Dc Bicarb drip  Trend BMP  K+ replaced  Decrease IVF at 75cc/hr   CARDIOVASCULAR CVL A:  Tachycardia - improved  P:  ICU monitoring  Check bnp   GASTROINTESTINAL A:   At Risk Aspiration  P:   Advance diet as tolerated   HEMATOLOGIC A:   Mild Anemia Thrombocytopenia  P:  Trend CBC Lovenox for DVT prophylaxis   INFECTIOUS A:  UTI - in setting of obstructive stone  Bilateral HCAP ?Aspiration  P:   BCx2 11/11 >>  UC  11/11 >> NEG   Abx: Cipro, start date 11/11 Levaquin start date 11/13  Vanc 11/13  Monitor fever curve / leukocytosis  Monitor perc neph Begin Levaquin  D/C Cipro   ENDOCRINE A:   Hyperglycemia -related D50 push? P:   CBGs Consider SSI if consistently > 180  NEUROLOGIC A:   Acute Encephalopathy - in setting of cumulative effect of narcotics  P:   RASS goal: n/a D/C dilaudid  PRN Narcan for oversedation  Change to PRN Percocet for pain with Fentanyl PRN for severe break through pain  Pt inconsistent on home regimen - reportedly takes Opana, xanax + Oxy at home  FAMILY  - Updates: no family available at time of rounds      PARRETT,TAMMY NP  01/18/2014, 9:19 AM  Care during the described time interval was provided by me and/or other providers on the critical care team.  I have reviewed this patient's available data, including medical history, events of note, physical examination and test results as part of my evaluation Reviewed CXR with new Bl; infx, favor aspiration & will broaden abx - he looks clinically improved  Oretha MilchALVA,RAKESH V. MD

## 2014-01-18 NOTE — Progress Notes (Signed)
ANTIBIOTIC CONSULT NOTE - INITIAL  Pharmacy Consult for Vancomycin and may renally-adjust antibiotics.  Indication: UTI, HCAP/aspiration pneumonia.  Allergies  Allergen Reactions  . Ceftriaxone Hives  . Eggs Or Egg-Derived Products     unknown  . Latex   . Peanut-Containing Drug Products     unknown   Labs:  Recent Labs  01/15/14 1702 01/16/14 0011 01/17/14 1607 01/18/14 0340  WBC 28.4* 6.6  --  8.9  HGB 14.7 12.6*  --  11.9*  PLT 166 121*  --  111*  CREATININE 2.04* 1.80* 2.03* 1.52*   Estimated Creatinine Clearance: 74.2 mL/min (by C-G formula based on Cr of 1.52).  Microbiology: Recent Results (from the past 720 hour(s))  Culture, blood (routine x 2)     Status: None (Preliminary result)   Collection Time: 01/16/14 12:16 AM  Result Value Ref Range Status   Specimen Description BLOOD LEFT HAND  Final   Special Requests BOTTLES DRAWN AEROBIC ONLY 2ML  Final   Culture  Setup Time   Final    01/17/2014 03:16 Performed at [facility]    Culture   Final           BLOOD CULTURE RECEIVED NO GROWTH TO DATE CULTURE WILL BE HELD FOR 5 DAYS BEFORE ISSUING A FINAL NEGATIVE REPORT Performed at [facility]    Report Status PENDING  Incomplete  Urine culture     Status: None   Collection Time: 01/16/14  9:11 PM  Result Value Ref Range Status   Specimen Description   Final    URINE, CATHETERIZED LEFT RENAL PELVIS AFTER DIRECT STICK   Special Requests Normal  Final   Culture  Setup Time   Final    01/17/2014 01:27 Performed at [facility]    Colony Count NO GROWTH Performed at [facility]   Final   Culture NO GROWTH Performed at [facility]   Final   Report Status 01/17/2014 FINAL  Final  Culture, blood (routine x 2)     Status: None (Preliminary result)   Collection Time: 01/17/14 12:10 AM  Result Value Ref Range Status   Specimen Description BLOOD RIGHT FOREARM  Final   Special Requests BOTTLES DRAWN AEROBIC ONLY 3ML   Final   Culture  Setup Time   Final    01/17/2014 03:16 Performed at [facility]    Culture   Final           BLOOD CULTURE RECEIVED NO GROWTH TO DATE CULTURE WILL BE HELD FOR 5 DAYS BEFORE ISSUING A FINAL NEGATIVE REPORT Performed at [facility]    Report Status PENDING  Incomplete  MRSA PCR Screening     Status: None   Collection Time: 01/17/14  8:14 AM  Result Value Ref Range Status   MRSA by PCR NEGATIVE NEGATIVE Final    Comment:        The GeneXpert MRSA Assay (FDA approved for NASAL specimens only), is one component of a comprehensive MRSA colonization surveillance program. It is not intended to diagnose MRSA infection nor to guide or monitor treatment for MRSA infections.     Assessment: [age] yoM admitted on 11/11 with severe flank pain, N/V, and known obstructing ureteral calculus and nonobstructing renal calculi. PMH significant for spina bifida, bladder exstrophy s/p multiple urological procedures. Now s/p left perc nephrostomy placed 11/11 PM. Pharmacy is consulted for antibiotic renal dose adjustment. 11/13 suspected aspiration pna. Pharmacy was asked to broad abx to  Unasyn but had hives with Rocephin this admission -> CCM changed Cipro to Levaquin.   11/11 >> ceftriaxone >> 11/11 11/12 >> cipro >> 11/13 11/13 >> Levaquin >> 11/13 >> Vanc >>  Tmax: afebrile WBCs: Improved to wnl Renal: Improved to 1.52, 74CG, 70N  11/11 blood x 2: ngtd 11/11 urine: neg F 11/12 MRSA PCR: neg  Goal of Therapy:  Vancomycin trough 15-20mg /l. Appropriate abx dosing, eradication of infection.   Plan:   Vanc 2g IV x 1 then 1g IV q12h.  Agree with Levaquin 750mg  IV q24h.  Follow up renal function and cultures as available.  Charolotte Ekeom Micholas Drumwright, PharmD, pager [phone]. 01/18/2014,12:33 PM.

## 2014-01-19 ENCOUNTER — Inpatient Hospital Stay (HOSPITAL_COMMUNITY): Payer: Medicaid Other

## 2014-01-19 DIAGNOSIS — I517 Cardiomegaly: Secondary | ICD-10-CM

## 2014-01-19 DIAGNOSIS — J9600 Acute respiratory failure, unspecified whether with hypoxia or hypercapnia: Secondary | ICD-10-CM | POA: Insufficient documentation

## 2014-01-19 DIAGNOSIS — R509 Fever, unspecified: Secondary | ICD-10-CM | POA: Insufficient documentation

## 2014-01-19 DIAGNOSIS — T17998A Other foreign object in respiratory tract, part unspecified causing other injury, initial encounter: Secondary | ICD-10-CM

## 2014-01-19 DIAGNOSIS — T17908A Unspecified foreign body in respiratory tract, part unspecified causing other injury, initial encounter: Secondary | ICD-10-CM | POA: Insufficient documentation

## 2014-01-19 LAB — CBC
HCT: 35.8 % — ABNORMAL LOW (ref 39.0–52.0)
Hemoglobin: 11.8 g/dL — ABNORMAL LOW (ref 13.0–17.0)
MCH: 28.6 pg (ref 26.0–34.0)
MCHC: 33 g/dL (ref 30.0–36.0)
MCV: 86.9 fL (ref 78.0–100.0)
PLATELETS: 108 10*3/uL — AB (ref 150–400)
RBC: 4.12 MIL/uL — ABNORMAL LOW (ref 4.22–5.81)
RDW: 14.2 % (ref 11.5–15.5)
WBC: 10.5 10*3/uL (ref 4.0–10.5)

## 2014-01-19 LAB — BASIC METABOLIC PANEL
ANION GAP: 11 (ref 5–15)
BUN: 11 mg/dL (ref 6–23)
CALCIUM: 8.3 mg/dL — AB (ref 8.4–10.5)
CO2: 30 mEq/L (ref 19–32)
Chloride: 99 mEq/L (ref 96–112)
Creatinine, Ser: 1.08 mg/dL (ref 0.50–1.35)
GFR calc Af Amer: >90 mL/min (ref >90)
GFR, EST NON AFRICAN AMERICAN: 89 mL/min — AB (ref >90)
Glucose, Bld: 102 mg/dL — ABNORMAL HIGH (ref 70–99)
Potassium: 3.3 mEq/L — ABNORMAL LOW (ref 3.7–5.3)
Sodium: 140 mEq/L (ref 137–147)

## 2014-01-19 LAB — GLUCOSE, CAPILLARY
Glucose-Capillary: 105 mg/dL — ABNORMAL HIGH (ref 70–99)
Glucose-Capillary: 105 mg/dL — ABNORMAL HIGH (ref 70–99)
Glucose-Capillary: 105 mg/dL — ABNORMAL HIGH (ref 70–99)

## 2014-01-19 LAB — PRO B NATRIURETIC PEPTIDE: Pro B Natriuretic peptide (BNP): 7846 pg/mL — ABNORMAL HIGH (ref 0–125)

## 2014-01-19 MED ORDER — FUROSEMIDE 10 MG/ML IJ SOLN
20.0000 mg | Freq: Once | INTRAMUSCULAR | Status: AC
  Administered 2014-01-19: 20 mg via INTRAVENOUS
  Filled 2014-01-19: qty 2

## 2014-01-19 MED ORDER — POTASSIUM CHLORIDE CRYS ER 20 MEQ PO TBCR
40.0000 meq | EXTENDED_RELEASE_TABLET | ORAL | Status: AC
  Administered 2014-01-19 (×2): 40 meq via ORAL
  Filled 2014-01-19 (×2): qty 2

## 2014-01-19 MED ORDER — INSULIN ASPART 100 UNIT/ML ~~LOC~~ SOLN: 0.0000 [IU] | Freq: Three times a day (TID) | SUBCUTANEOUS | Status: DC

## 2014-01-19 NOTE — Progress Notes (Signed)
Pt still refusing CPAP/BIPAP for the night.  Pt currently on 6 LPM  and tolerating well at this time, RT to monitor and assess as needed.

## 2014-01-19 NOTE — Progress Notes (Deleted)
CRITICAL VALUE ALERT  Critical value received:  Plt 25  Date of notification:  t   Time of notification:  0500  Critical value read back:Yes.    Nurse who received alert:  Estill Dooms*Nuel Dejaynes Mahario   MD notified (1st page):  Elray McgregorMary lynch NP  Time of first page:  0510  MD notified (2nd page):  Time of second page:  Responding MD:  Elray McgregorMary Lynch NP  Time MD responded:  [id number]

## 2014-01-19 NOTE — Consult Note (Signed)
PULMONARY / CRITICAL CARE MEDICINE   Name: Blake Lara MRN: [id number] DOB: 11/22/1980    ADMISSION DATE:  01/16/2014 CONSULTATION DATE:  01/17/14  REFERRING MD :  Dr. Laverle PatterBorden  CHIEF COMPLAINT:  Unresponsiveness   INITIAL PRESENTATION: 33 y/o M with PMH of spina bifida and bladder exstrophy s/p multiple urologic procedures admitted on 11/11 with ureteral stone and hydronephrosis.  11/12 developed unresponsiveness after dose of dilaudid for pain @ 3am.  PCCM consulted for evaluation.   STUDIES:  11/11  CT Abd/Pelvis >> mod L hydronephrosis secondary to calculus in distal ureter (10 mm)  SIGNIFICANT EVENTS: 11/11  Admit with ureteral stone & hydronephrosis  11/11  Perc Nephrostomy tube placed.  11/12 hypoxia , resp distress, resolved with bipap 11/14-continued desaturations  SUBJECTIVE:desats, no distress  VITAL SIGNS: Temp:  [98.1 F (36.7 C)-98.6 F (37 C)] 98.1 F (36.7 C) (11/14 0800) Pulse Rate:  [88-104] 93 (11/14 0600) Resp:  [19-35] 31 (11/14 0600) BP: (103-142)/(45-83) 129/83 mmHg (11/14 0600) SpO2:  [87 %-95 %] 91 % (11/14 0600)   HEMODYNAMICS:     VENTILATOR SETTINGS:     INTAKE / OUTPUT:  Intake/Output Summary (Last 24 hours) at 01/19/14 [id number] Last data filed at 01/19/14 0831  Gross per 24 hour  Intake 918.75 ml  Output   2020 ml  Net -1101.25 ml    PHYSICAL EXAMINATION: General:  Obese adult male, awake and alert  Neuro:  Awake and alert , Following commands and appropriate  HEENT:  Mm pink/dry Cardiovascular:  s1s2 rrr, no m/r/g, Lungs:  reduced Abdomen:  Obese/soft, bsx4 active  Musculoskeletal:  Chronic LE changes c/w spina bifida Skin:  Warm/dry, foot edema, multiple tattoos  LABS:  CBC  Recent Labs Lab 01/16/14 0011 01/18/14 0340 01/19/14 0341  WBC 6.6 8.9 10.5  HGB 12.6* 11.9* 11.8*  HCT 36.2* 36.0* 35.8*  PLT 121* 111* 108*   Coag's No results for input(s): APTT, INR in the last 168 hours. BMET  Recent Labs Lab  01/17/14 1607 01/18/14 0340 01/19/14 0341  NA 132* 138 140  K 4.7 3.2* 3.3*  CL 99 97 99  CO2 18* 31 30  BUN 25* 20 11  CREATININE 2.03* 1.52* 1.08  GLUCOSE 124* 122* 102*   Electrolytes  Recent Labs Lab 01/17/14 1607 01/18/14 0340 01/19/14 0341  CALCIUM 8.2* 8.2* 8.3*   Sepsis Markers No results for input(s): LATICACIDVEN, PROCALCITON, O2SATVEN in the last 168 hours. ABG  Recent Labs Lab 01/17/14 0855 01/17/14 1533  PHART 7.203* 7.303*  PCO2ART 43.7 46.7*  PO2ART 122.0* 53.0*   Liver Enzymes  Recent Labs Lab 01/15/14 1702  AST 36  ALT 35  ALKPHOS 93  BILITOT 2.5*  ALBUMIN 3.2*   Cardiac Enzymes  Recent Labs Lab 01/19/14 0340  PROBNP 7846.0*   Glucose  Recent Labs Lab 01/17/14 0713  GLUCAP 263*    Imaging Dg Chest Port 1 View  01/18/2014   CLINICAL DATA:  Respiratory failure neck  EXAM: PORTABLE CHEST - 1 VIEW  COMPARISON:  Portable chest x-ray of January 17, 2014  FINDINGS: The lungs are hypoinflated. Confluent alveolar opacities have developed throughout the right lung and with in the left perihilar region. The interstitial markings are prominent diffusely. The cardiac silhouette is indistinct. There is no pleural effusion or pneumothorax. There is chronic curvature of the mid thoracic spine and S-shaped configuration.  IMPRESSION: Significant change in the appearance of the chest since yesterday with findings consistent with bilateral alveolar pneumonia or other alveolar filling  processes including pulmonary edema.   Electronically Signed   By: David  SwazilandJordan   On: 01/18/2014 07:41     ASSESSMENT / PLAN:  PULMONARY OETT A: Acute Respiratory Failure - in setting of narcotic usage>resolved  Bilateral HCAP vs Pulm edema  Probable OSA Favor aspiration P:   Check cxr in am  Minimize narcotics as able  Check BNP noted, continued neg balance goals, was neg 871 cc last 24 hrs Noct CPAP for now - refused Will need sleep study Upright as  able Needs echo ssessment IS, small lung volumes on pcxr  RENAL A:   L Renal Calculi with Hydronephrosis - s/p L perc nephrostomy 11/11  Acute Kidney Injury - in setting of above  Illeostomy Bladder Extrophy Hypokalemia P:   Urology following and will act as consulatnt Perc Nephrostomy care as instructed by IR / Urology  Trend BMP  K+ replace oral 2 doses Decrease IVF to kvo Lasix x 1 low dose as further desatuation  CARDIOVASCULAR CVL A:  Tachycardia - improved  R/o congenital valvular dz, cardiomyopathy  P:  ICU monitoring  Echo lasix  GASTROINTESTINAL A:   At Risk Aspiration  P:   Advance diet as tolerated  slp  HEMATOLOGIC A:   Mild Anemia Thrombocytopenia  P:  Trend CBC further Lovenox for DVT prophylaxis   INFECTIOUS A:  UTI - in setting of obstructive stone  Bilateral HCAP ?Aspiration  P:   BCx2 11/11 >>  UC  11/11 >> NEG   Abx: Cipro, start date 11/11 Levaquin start date 11/13 >>> Vanc 11/13>>>  Nephrostomy in place Repeat UA for clearance , with new ARDS   ENDOCRINE A:   Hyperglycemia P:   CBGs Add ssi  NEUROLOGIC A:   Acute Encephalopathy - in setting of cumulative effect of narcotics  P:   RASS goal: 0 PRN Narcan for oversedation  PRN Percocet for pain with Fentanyl PRN for severe break through pain  Pt inconsistent on home regimen - reportedly takes Opana, xanax + Oxy at home  FAMILY  - Updates: pt and fam updated  Get echo, UA, lasix x 1, replace K , pcxr in am , IS To pccm service  Mcarthur Rossettianiel J. Tyson AliasFeinstein, MD, FACP Pgr: [phone] [facility]

## 2014-01-19 NOTE — Progress Notes (Signed)
Patient ID: Blake Lara, male   DOB: 25-Mar-1980, 33 y.o.   MRN: [id number]    Subjective: Continues to have significant oxygen requirement - nasal canula inadequate this am to maintain sats and was swtiched back to non-rebreather.  From GU perspective has persistent but stable lower abdominal pain.  Nephrostomy tube draining well.   Objective: Vital signs in last 24 hours: Temp:  [98.1 F (36.7 C)-98.6 F (37 C)] 98.1 F (36.7 C) (11/14 0800) Pulse Rate:  [88-104] 93 (11/14 0600) Resp:  [19-35] 31 (11/14 0600) BP: (103-142)/(45-83) 129/83 mmHg (11/14 0600) SpO2:  [87 %-95 %] 91 % (11/14 0600)  Intake/Output from previous day: 11/13 0701 - 11/14 0700 In: 918.8 [P.O.:360; I.V.:368.8; IV Piggyback:150] Out: 1770 [Urine:1120; Stool:650] Intake/Output this shift: Total I/O In: -  Out: 250 [Urine:250]  Physical Exam:  General: Alert and oriented CV: RRR Lungs: Clear Abdomen: Soft, NT, PCN draining grossly clear urine  Lab Results:  Recent Labs  01/18/14 0340 01/19/14 0341  HGB 11.9* 11.8*  HCT 36.0* 35.8*   CBC Latest Ref Rng 01/19/2014 01/18/2014 01/16/2014  WBC 4.0 - 10.5 K/uL 10.5 8.9 6.6  Hemoglobin 13.0 - 17.0 g/dL 11.8(L) 11.9(L) 12.6(L)  Hematocrit 39.0 - 52.0 % 35.8(L) 36.0(L) 36.2(L)  Platelets 150 - 400 K/uL 108(L) 111(L) 121(L)    Recent Results (from the past 240 hour(s))  Culture, blood (routine x 2)     Status: None (Preliminary result)   Collection Time: 01/16/14 12:16 AM  Result Value Ref Range Status   Specimen Description BLOOD LEFT HAND  Final   Special Requests BOTTLES DRAWN AEROBIC ONLY 2ML  Final   Culture  Setup Time   Final    01/17/2014 03:16 Performed at [facility]    Culture   Final           BLOOD CULTURE RECEIVED NO GROWTH TO DATE CULTURE WILL BE HELD FOR 5 DAYS BEFORE ISSUING A FINAL NEGATIVE REPORT Performed at [facility]    Report Status PENDING  Incomplete  Urine culture     Status: None   Collection Time:  01/16/14  9:11 PM  Result Value Ref Range Status   Specimen Description   Final    URINE, CATHETERIZED LEFT RENAL PELVIS AFTER DIRECT STICK   Special Requests Normal  Final   Culture  Setup Time   Final    01/17/2014 01:27 Performed at [facility]    Colony Count NO GROWTH Performed at [facility]   Final   Culture NO GROWTH Performed at [facility]   Final   Report Status 01/17/2014 FINAL  Final  Culture, blood (routine x 2)     Status: None (Preliminary result)   Collection Time: 01/17/14 12:10 AM  Result Value Ref Range Status   Specimen Description BLOOD RIGHT FOREARM  Final   Special Requests BOTTLES DRAWN AEROBIC ONLY 3ML  Final   Culture  Setup Time   Final    01/17/2014 03:16 Performed at [facility]    Culture   Final           BLOOD CULTURE RECEIVED NO GROWTH TO DATE CULTURE WILL BE HELD FOR 5 DAYS BEFORE ISSUING A FINAL NEGATIVE REPORT Performed at [facility]    Report Status PENDING  Incomplete  MRSA PCR Screening     Status: None   Collection Time: 01/17/14  8:14 AM  Result Value Ref Range Status   MRSA by PCR NEGATIVE NEGATIVE  Final    Comment:        The GeneXpert MRSA Assay (FDA approved for NASAL specimens only), is one component of a comprehensive MRSA colonization surveillance program. It is not intended to diagnose MRSA infection nor to guide or monitor treatment for MRSA infections.     BMET  Recent Labs  01/18/14 0340 01/19/14 0341  NA 138 140  K 3.2* 3.3*  CL 97 99  CO2 31 30  GLUCOSE 122* 102*  BUN 20 11  CREATININE 1.52* 1.08  CALCIUM 8.2* 8.3*     Studies/Results:   Dg Chest Port 1 View  01/18/2014   CLINICAL DATA:  Respiratory failure neck  EXAM: PORTABLE CHEST - 1 VIEW  COMPARISON:  Portable chest x-ray of January 17, 2014  FINDINGS: The lungs are hypoinflated. Confluent alveolar opacities have developed throughout the right lung and with in the left perihilar region. The  interstitial markings are prominent diffusely. The cardiac silhouette is indistinct. There is no pleural effusion or pneumothorax. There is chronic curvature of the mid thoracic spine and S-shaped configuration.  IMPRESSION: Significant change in the appearance of the chest since yesterday with findings consistent with bilateral alveolar pneumonia or other alveolar filling processes including pulmonary edema.   Electronically Signed   By: David  [place]   On: 01/18/2014 07:41   Dg Chest Port 1 View  01/17/2014   CLINICAL DATA:  Difficulty breathing  EXAM: PORTABLE CHEST - 1 VIEW  COMPARISON:  None.  FINDINGS: There is consolidation in the left mid lung. There is patchy atelectasis in the right mid lung and both base regions. Heart size and pulmonary vascularity are within normal limits. No adenopathy. No bone lesions apparent.  IMPRESSION: Areas of patchy atelectasis bilaterally. Left mid lung consolidation.   Electronically Signed   By: Bretta BangWilliam  Woodruff M.D.   On: 01/17/2014 07:58    Assessment/Plan:  1) Left ureteral obstruction/distal ureteral stone: S/P L PCN draining well.  Negative urine culture from nephrostomy procedure indicates that maybe he did not have a renal infection.  However, will continue antibiotic course empirically (now on Levaquin).  Have begun to discuss definitive treatment options considering the complexity of his urologic anatomy.  Options include referral to [facility] (where he has received prior urologic care) or [place] [facility] [place] [facility] [place] possible ureteroscopic therapy either retrograde or antegrade as indicated. Would not proceed with therapy until 10-14 days of antibiotic therapy.  2) Possible pneumonia/aspiration:  He continues to have significant O2 requirement which has not resolved.  He will likely remain in Stepdown today given lack of improvement over past 24 hours.  Spoke with critical care team and they will plan to take him onto their service to manage his  pulmonary issues.  We greatly appreciated their assistance with this complex patient.    LOS: 3 days   Almira BarKirby, Will 01/19/2014, 9:23 AM   Seen, examined and discussed with Dr. Craige CottaKirby and agree with assessment and plan.

## 2014-01-19 NOTE — Evaluation (Signed)
Clinical/Bedside Swallow Evaluation Patient Details  Name: Blake Lara MRN: [id number] Date of Birth: 09-20-1980  Today's Date: 01/19/2014 Time: 1415-1430 SLP Time Calculation (min) (ACUTE ONLY): 15 min  Past Medical History:  Past Medical History  Diagnosis Date  . Spina bifida   . Dislocated hip     hx of  . Bladder extrophy   . Ileostomy present    Past Surgical History:  Past Surgical History  Procedure Laterality Date  . Abdominal surgery    . Colon surgery     HPI:  33 year old male admitted 01/16/14 due to kidney stone. PMH significant for spina bifida, bladder exstrophy. CXR revealed patchy airspace disease, worse on right.   Assessment / Plan / Recommendation Clinical Impression  Pt willing/able to take only thin liquids at this time. He is currently requiring venti mask at 45%, and continues to have difficulty keeping O2 sats above 90.  Pt exhibits a delayed cough following thin liquids, and reports difficulty with solids (from a more esophageal issue standpoint), indicating he swallows meats, but then has to bring it back up.  At this time, pt should be NPO if his O2 requirements are more significant than nasal cannula. Per RN, pt changes back and forth from [place] to venti mask to BiPAP depending on O2 sats. Once pt respiratory status is improved, and stable, recommend completing MBS to objectively assess swallow function and safety. Pt is in agreement with this plan..    Aspiration Risk  Moderate    Diet Recommendation NPO   Liquid Administration via: Straw    Other  Recommendations Recommended Consults: MBS (once respiratory status stable, O2 requirement decreased) Oral Care Recommendations: Oral care BID   Follow Up Recommendations   (TBD)    Frequency and Duration min 2x/week  1 week   Pertinent Vitals/Pain Pt requiring venti mask, and still has difficulty keeping O2 sats above 90. No pain reported.    SLP Swallow Goals  MBS when respiratory status  improved/stable   Swallow Study Prior Functional Status   Pt reports difficulty with meats, and indicated he coughs whenever he eats.    General Date of Onset: 01/16/14 HPI: 33 year old male admitted 01/16/14 due to kidney stone. PMH significant for spina bifida, bladder exstrophy. CXR revealed patchy airspace disease, worse on right. Type of Study: Bedside swallow evaluation Previous Swallow Assessment: none found Diet Prior to this Study: Regular;Thin liquids Temperature Spikes Noted: No Respiratory Status: venti-mask History of Recent Intubation: No Behavior/Cognition: Alert;Cooperative;Pleasant mood Oral Cavity - Dentition: Adequate natural dentition Self-Feeding Abilities: Able to feed self Patient Positioning: Upright in bed Baseline Vocal Quality: Clear Volitional Cough: Strong Volitional Swallow: Able to elicit    Oral/Motor/Sensory Function Overall Oral Motor/Sensory Function: Appears within functional limits for tasks assessed   Ice Chips Ice chips: Not tested   Thin Liquid Thin Liquid: Impaired Presentation: Straw Pharyngeal  Phase Impairments: Cough - Delayed (inconsistent cough)    Nectar Thick Nectar Thick Liquid: Not tested   Honey Thick Honey Thick Liquid: Not tested   Puree Puree: Not tested Other Comments: pt refused   Solid   GO    Solid: Not tested Other Comments: pt refused      Celia B. [place], MSP, CCC-SLP [phone]  Leigh AuroraBueche, Celia Brown 01/19/2014,3:02 PM

## 2014-01-19 NOTE — Progress Notes (Signed)
  Echocardiogram 2D Echocardiogram has been performed.  Janalyn HarderWest, Thayer Inabinet R 01/19/2014, 1:34 PM

## 2014-01-19 NOTE — Progress Notes (Signed)
SLP Cancellation Note  Patient Details Name: Blake Lara MRN: [id number] DOB: 1980-06-25   Cancelled treatment:         Unable to complete BSE at this time. Pt having echo in room. Will continue efforts.  Celia B. Murvin NatalBueche, [facility]P, CCC-SLP [phone]  Leigh AuroraBueche, Celia Brown 01/19/2014, 12:45 PM

## 2014-01-19 NOTE — Progress Notes (Signed)
Pt currently on VM and tolerating well at this time, Pt doesn't require BIPAP at this time, RT to continue to monitor and assess throughout the night.

## 2014-01-20 ENCOUNTER — Encounter (HOSPITAL_COMMUNITY): Payer: Self-pay | Admitting: Radiology

## 2014-01-20 ENCOUNTER — Inpatient Hospital Stay (HOSPITAL_COMMUNITY): Payer: Medicaid Other

## 2014-01-20 DIAGNOSIS — N131 Hydronephrosis with ureteral stricture, not elsewhere classified: Secondary | ICD-10-CM

## 2014-01-20 LAB — URINE MICROSCOPIC-ADD ON

## 2014-01-20 LAB — GLUCOSE, CAPILLARY
GLUCOSE-CAPILLARY: 156 mg/dL — AB (ref 70–99)
GLUCOSE-CAPILLARY: 162 mg/dL — AB (ref 70–99)
Glucose-Capillary: 135 mg/dL — ABNORMAL HIGH (ref 70–99)
Glucose-Capillary: 124 mg/dL — ABNORMAL HIGH (ref 70–99)

## 2014-01-20 LAB — COMPREHENSIVE METABOLIC PANEL
ALT: 35 U/L (ref 0–53)
AST: 39 U/L — AB (ref 0–37)
Albumin: 2.3 g/dL — ABNORMAL LOW (ref 3.5–5.2)
Alkaline Phosphatase: 121 U/L — ABNORMAL HIGH (ref 39–117)
Anion gap: 13 (ref 5–15)
BILIRUBIN TOTAL: 3 mg/dL — AB (ref 0.3–1.2)
BUN: 8 mg/dL (ref 6–23)
CHLORIDE: 96 meq/L (ref 96–112)
CO2: 27 meq/L (ref 19–32)
Calcium: 8.4 mg/dL (ref 8.4–10.5)
Creatinine, Ser: 0.98 mg/dL (ref 0.50–1.35)
Glucose, Bld: 105 mg/dL — ABNORMAL HIGH (ref 70–99)
POTASSIUM: 3.7 meq/L (ref 3.7–5.3)
Sodium: 136 mEq/L — ABNORMAL LOW (ref 137–147)
Total Protein: 6.7 g/dL (ref 6.0–8.3)

## 2014-01-20 LAB — CBC WITH DIFFERENTIAL/PLATELET
BASOS ABS: 0 10*3/uL (ref 0.0–0.1)
BASOS PCT: 0 % (ref 0–1)
EOS ABS: 0.1 10*3/uL (ref 0.0–0.7)
Eosinophils Relative: 1 % (ref 0–5)
HCT: 38.2 % — ABNORMAL LOW (ref 39.0–52.0)
Hemoglobin: 12.9 g/dL — ABNORMAL LOW (ref 13.0–17.0)
LYMPHS PCT: 19 % (ref 12–46)
Lymphs Abs: 2.2 10*3/uL (ref 0.7–4.0)
MCH: 28.9 pg (ref 26.0–34.0)
MCHC: 33.8 g/dL (ref 30.0–36.0)
MCV: 85.5 fL (ref 78.0–100.0)
Monocytes Absolute: 2.3 10*3/uL — ABNORMAL HIGH (ref 0.1–1.0)
Monocytes Relative: 20 % — ABNORMAL HIGH (ref 3–12)
NEUTROS PCT: 60 % (ref 43–77)
Neutro Abs: 6.9 10*3/uL (ref 1.7–7.7)
Platelets: 111 10*3/uL — ABNORMAL LOW (ref 150–400)
RBC: 4.47 MIL/uL (ref 4.22–5.81)
RDW: 14.3 % (ref 11.5–15.5)
WBC: 11.5 10*3/uL — ABNORMAL HIGH (ref 4.0–10.5)

## 2014-01-20 LAB — URINALYSIS, ROUTINE W REFLEX MICROSCOPIC
BILIRUBIN URINE: NEGATIVE
GLUCOSE, UA: NEGATIVE mg/dL
KETONES UR: NEGATIVE mg/dL
Nitrite: NEGATIVE
PH: 8 (ref 5.0–8.0)
Protein, ur: 30 mg/dL — AB
Specific Gravity, Urine: 1.008 (ref 1.005–1.030)
Urobilinogen, UA: 0.2 mg/dL (ref 0.0–1.0)

## 2014-01-20 LAB — BASIC METABOLIC PANEL
Anion gap: 12 (ref 5–15)
BUN: 8 mg/dL (ref 6–23)
CO2: 29 meq/L (ref 19–32)
Calcium: 8.7 mg/dL (ref 8.4–10.5)
Chloride: 93 mEq/L — ABNORMAL LOW (ref 96–112)
Creatinine, Ser: 0.96 mg/dL (ref 0.50–1.35)
GFR calc Af Amer: >90 mL/min (ref >90)
GFR calc non Af Amer: >90 mL/min (ref >90)
Glucose, Bld: 153 mg/dL — ABNORMAL HIGH (ref 70–99)
POTASSIUM: 3.4 meq/L — AB (ref 3.7–5.3)
SODIUM: 134 meq/L — AB (ref 137–147)

## 2014-01-20 MED ORDER — FUROSEMIDE 10 MG/ML IJ SOLN
20.0000 mg | Freq: Two times a day (BID) | INTRAMUSCULAR | Status: AC
  Administered 2014-01-20 – 2014-01-22 (×4): 20 mg via INTRAVENOUS
  Filled 2014-01-20 (×4): qty 2

## 2014-01-20 MED ORDER — ACETAMINOPHEN 325 MG PO TABS
650.0000 mg | ORAL_TABLET | ORAL | Status: DC | PRN
  Administered 2014-01-21: 650 mg via ORAL
  Filled 2014-01-20: qty 2

## 2014-01-20 MED ORDER — SODIUM CHLORIDE 0.9 % IJ SOLN
10.0000 mL | Freq: Two times a day (BID) | INTRAMUSCULAR | Status: DC
  Administered 2014-01-20: 10 mL

## 2014-01-20 MED ORDER — POTASSIUM CHLORIDE 10 MEQ/100ML IV SOLN
10.0000 meq | INTRAVENOUS | Status: AC
  Administered 2014-01-20 (×4): 10 meq via INTRAVENOUS
  Filled 2014-01-20 (×3): qty 100

## 2014-01-20 MED ORDER — ENOXAPARIN SODIUM 40 MG/0.4ML ~~LOC~~ SOLN
40.0000 mg | SUBCUTANEOUS | Status: DC
  Administered 2014-01-20 – 2014-01-21 (×2): 40 mg via SUBCUTANEOUS
  Filled 2014-01-20 (×3): qty 0.4

## 2014-01-20 MED ORDER — POTASSIUM CHLORIDE CRYS ER 20 MEQ PO TBCR: 40.0000 meq | EXTENDED_RELEASE_TABLET | ORAL | Status: DC

## 2014-01-20 MED ORDER — SODIUM CHLORIDE 0.9 % IJ SOLN
10.0000 mL | INTRAMUSCULAR | Status: DC | PRN
  Administered 2014-01-22 (×2): 10 mL
  Filled 2014-01-20 (×2): qty 40

## 2014-01-20 NOTE — Consult Note (Signed)
PULMONARY / CRITICAL CARE MEDICINE   Name: Blake Lara MRN: [id number] DOB: 03-Nov-1980    ADMISSION DATE:  01/16/2014 CONSULTATION DATE:  01/17/14  REFERRING MD :  Dr. Laverle PatterBorden  CHIEF COMPLAINT:  Unresponsiveness   INITIAL PRESENTATION: 33 y/o M with PMH of spina bifida and bladder exstrophy s/p multiple urologic procedures admitted on 11/11 with ureteral stone and hydronephrosis.  11/12 developed unresponsiveness after dose of dilaudid for pain @ 3am.  PCCM consulted for evaluation.   STUDIES:  11/11  CT Abd/Pelvis >> mod L hydronephrosis secondary to calculus in distal ureter (10 mm) 11/14 echo- pt position poor, inaccurate  SIGNIFICANT EVENTS: 11/11  Admit with ureteral stone & hydronephrosis  11/11  Perc Nephrostomy tube placed.  11/12 hypoxia , resp distress, resolved with bipap 11/14-continued desaturations 11/14- refused slp, refuses BIPAP, would lay flat for echo 11/15- neg 2 liters and O2 needs better  SUBJECTIVE: to 6 liters a few times, neg 2 liters  VITAL SIGNS: Temp:  [98.1 F (36.7 C)-100.5 F (38.1 C)] 100 F (37.8 C) (11/15 0800) Pulse Rate:  [88-116] 116 (11/15 0916) Resp:  [22-38] 24 (11/15 0916) BP: (120-171)/(53-105) 147/86 mmHg (11/15 0916) SpO2:  [85 %-93 %] 85 % (11/15 0916) FiO2 (%):  [45 %] 45 % (11/14 1700)   HEMODYNAMICS:     VENTILATOR SETTINGS: Vent Mode:  [-]  FiO2 (%):  [45 %] 45 %   INTAKE / OUTPUT:  Intake/Output Summary (Last 24 hours) at 01/20/14 1113 Last data filed at 01/20/14 [id number]  Gross per 24 hour  Intake    750 ml  Output   2875 ml  Net  -2125 ml    PHYSICAL EXAMINATION: General:  Obese adult male, awake and alert  Neuro:  Awake and alert , Following commands and appropriate, refusing almost every therapy we offer HEENT:  Mm pink/dry Cardiovascular:  s1s2 rrr, no m/r/g, Lungs:  Reduced coarse Abdomen:  Obese/soft, bsx4 active  Musculoskeletal:  Chronic LE changes c/w spina bifida Skin:  Warm/dry, foot edema  unchanged, multiple tattoos  LABS:  CBC  Recent Labs Lab 01/18/14 0340 01/19/14 0341 01/20/14 0348  WBC 8.9 10.5 11.5*  HGB 11.9* 11.8* 12.9*  HCT 36.0* 35.8* 38.2*  PLT 111* 108* 111*   Coag's No results for input(s): APTT, INR in the last 168 hours. BMET  Recent Labs Lab 01/18/14 0340 01/19/14 0341 01/20/14 0348  NA 138 140 136*  K 3.2* 3.3* 3.7  CL 97 99 96  CO2 31 30 27   BUN 20 11 8   CREATININE 1.52* 1.08 0.98  GLUCOSE 122* 102* 105*   Electrolytes  Recent Labs Lab 01/18/14 0340 01/19/14 0341 01/20/14 0348  CALCIUM 8.2* 8.3* 8.4   Sepsis Markers No results for input(s): LATICACIDVEN, PROCALCITON, O2SATVEN in the last 168 hours. ABG  Recent Labs Lab 01/17/14 0855 01/17/14 1533  PHART 7.203* 7.303*  PCO2ART 43.7 46.7*  PO2ART 122.0* 53.0*   Liver Enzymes  Recent Labs Lab 01/15/14 1702 01/20/14 0348  AST 36 39*  ALT 35 35  ALKPHOS 93 121*  BILITOT 2.5* 3.0*  ALBUMIN 3.2* 2.3*   Cardiac Enzymes  Recent Labs Lab 01/19/14 0340  PROBNP 7846.0*   Glucose  Recent Labs Lab 01/17/14 0713 01/19/14 1137 01/19/14 1712 01/19/14 2156 01/20/14 0900  GLUCAP 263* 105* 105* 105* 135*    Imaging Dg Chest Port 1 View  01/19/2014   CLINICAL DATA:  Pneumonia.  EXAM: PORTABLE CHEST - 1 VIEW  COMPARISON:  One-view chest 01/18/2014.  FINDINGS: Right greater than left airspace disease is not significantly changed. The patient is slightly rotated to the right. Mild generalized edema is present as well. Small effusions are suspected. The lung volumes are low.  IMPRESSION: 1. Stable appearance of patchy airspace disease, worse on the right, suggesting pneumonia or alveolar edema. 2. Stable interstitial pattern compatible with edema. 3. Persistent low lung volumes.   Electronically Signed   By: Gennette Pachris  Mattern M.D.   On: 01/19/2014 07:11     ASSESSMENT / PLAN:  PULMONARY OETT A: Acute Respiratory Failure - in setting of narcotic usage>resolved   Bilateral HCAP vs Pulm edema  Probable OSA Favor aspiration P:   Check cxr in am and CT chest to define infiltrate better slp barium recommended Minimize narcotics as able  Noct CPAP for now - refused Will need sleep study Upright as able Needs echo assessment unable  IS Increase or maintain diuresis, he loved it , renal fxn and to 6 liters  RENAL A:   L Renal Calculi with Hydronephrosis - s/p L perc nephrostomy 11/11  Acute Kidney Injury - in setting of above  Illeostomy Bladder Extrophy Hypokalemia P:   Urology following and will act as consulatnt - they wil let us know upon dc if to [facility] or [place] Perc Nephrostomy care as instructed by IR / Urology  Trend BMP  K+ replace today as with  lasix Lasix maintain  CARDIOVASCULAR CVL A:  Tachycardia - improved  R/o congenital valvular dz, cardiomyopathy  P:  ICU monitoring  Echo poor images Lasix to bid, has responded  GASTROINTESTINAL A:   At Risk Aspiration  P:   Advance diet as tolerated  slp refused Barium planned  HEMATOLOGIC A:   Mild Anemia Thrombocytopenia  P:  Limit phlebotomy Lovenox for DVT prophylaxis   INFECTIOUS A:  UTI - in setting of obstructive stone  Bilateral HCAP ?Aspiration  P:   BCx2 11/11 >>  UC  11/11 >> NEG   Abx: Cipro, start date 11/11 Levaquin start date 11/13 >>> Vanc 11/13>>>11/15  no MRS, dc vanc Nephrostomy in place Repeat UA for clearance , with new ARDS - awaited  ENDOCRINE A:   Hyperglycemia P:   CBGs ssi  NEUROLOGIC A:   Acute Encephalopathy - in setting of cumulative effect of narcotics  P:   RASS goal: 0 PRN Narcan for oversedation  PRN Percocet for pain with Fentanyl PRN for severe break through pain  Pt inconsistent on home regimen - reportedly takes Opana, xanax + Oxy at home  FAMILY  - Updates: pt and fam updated  Improved slight, needs CT chest, lasix maintain   Mcarthur Rossettianiel J. Tyson AliasFeinstein, MD, FACP Pgr: [phone] [facility]

## 2014-01-20 NOTE — Plan of Care (Signed)
Problem: Phase I Progression Outcomes Goal: Pain controlled with appropriate interventions Outcome: Completed/Met Date Met:  01/20/14 Goal: OOB as tolerated unless otherwise ordered Outcome: Not Applicable Date Met:  [birth year] History of Spina Bifida. Pt is wheelchair bound.   Problem: Phase II Progression Outcomes Goal: Vital signs remain stable Outcome: Progressing

## 2014-01-20 NOTE — Progress Notes (Signed)
Pt continues to refuse CPAP/BIPAP.  RT to monitor and assess as needed.

## 2014-01-20 NOTE — Progress Notes (Signed)
Patient ID: Blake Lara, male   DOB: [birth year], 33 y.o.   MRN: [id number]    Subjective: Continues to have significant oxygen requirement - nasal canula this am.  Blake Lara feels about the same as he did yesterday.  He refused BiPAP and his swallow study yesterday.  From GU perspective has persistent but stable lower abdominal pain.  Nephrostomy tube draining well without issue.   Objective: Vital signs in last 24 hours: Temp:  [98.1 F (36.7 C)-100.5 F (38.1 C)] 100 F (37.8 C) (11/15 0800) Pulse Rate:  [88-116] 116 (11/15 0916) Resp:  [22-38] 24 (11/15 0916) BP: (120-171)/(53-105) 147/86 mmHg (11/15 0916) SpO2:  [85 %-93 %] 85 % (11/15 0916) FiO2 (%):  [45 %] 45 % (11/14 1700)  Intake/Output from previous day: 11/14 0701 - 11/15 0700 In: 900 [P.O.:480; I.V.:60; IV Piggyback:350] Out: 2875 [Urine:2450; Stool:425] Intake/Output this shift: Total I/O In: -  Out: 250 [Urine:250]  Physical Exam:  General: Alert and oriented CV: RRR Lungs: Clear Abdomen: Soft, NT, PCN draining grossly clear urine  Lab Results:  Recent Labs  01/18/14 0340 01/19/14 0341 01/20/14 0348  HGB 11.9* 11.8* 12.9*  HCT 36.0* 35.8* 38.2*   CBC Latest Ref Rng 01/20/2014 01/19/2014 01/18/2014  WBC 4.0 - 10.5 K/uL 11.5(H) 10.5 8.9  Hemoglobin 13.0 - 17.0 g/dL 12.9(L) 11.8(L) 11.9(L)  Hematocrit 39.0 - 52.0 % 38.2(L) 35.8(L) 36.0(L)  Platelets 150 - 400 K/uL 111(L) 108(L) 111(L)    Recent Results (from the past 240 hour(s))  Culture, blood (routine x 2)     Status: None (Preliminary result)   Collection Time: 01/16/14 12:16 AM  Result Value Ref Range Status   Specimen Description BLOOD LEFT HAND  Final   Special Requests BOTTLES DRAWN AEROBIC ONLY 2ML  Final   Culture  Setup Time   Final    01/17/2014 03:16 Performed at [facility]    Culture   Final           BLOOD CULTURE RECEIVED NO GROWTH TO DATE CULTURE Blake Lara BE HELD FOR 5 DAYS BEFORE ISSUING A FINAL NEGATIVE REPORT Performed at  [facility]    Report Status PENDING  Incomplete  Urine culture     Status: None   Collection Time: 01/16/14  9:11 PM  Result Value Ref Range Status   Specimen Description   Final    URINE, CATHETERIZED LEFT RENAL PELVIS AFTER DIRECT STICK   Special Requests Normal  Final   Culture  Setup Time   Final    01/17/2014 01:27 Performed at [facility]    Colony Count NO GROWTH Performed at [facility]   Final   Culture NO GROWTH Performed at [facility]   Final   Report Status 01/17/2014 FINAL  Final  Culture, blood (routine x 2)     Status: None (Preliminary result)   Collection Time: 01/17/14 12:10 AM  Result Value Ref Range Status   Specimen Description BLOOD RIGHT FOREARM  Final   Special Requests BOTTLES DRAWN AEROBIC ONLY 3ML  Final   Culture  Setup Time   Final    01/17/2014 03:16 Performed at [facility]    Culture   Final           BLOOD CULTURE RECEIVED NO GROWTH TO DATE CULTURE Blake Lara BE HELD FOR 5 DAYS BEFORE ISSUING A FINAL NEGATIVE REPORT Performed at [facility]    Report Status PENDING  Incomplete  MRSA PCR Screening  Status: None   Collection Time: 01/17/14  8:14 AM  Result Value Ref Range Status   MRSA by PCR NEGATIVE NEGATIVE Final    Comment:        The GeneXpert MRSA Assay (FDA approved for NASAL specimens only), is one component of a comprehensive MRSA colonization surveillance program. It is not intended to diagnose MRSA infection nor to guide or monitor treatment for MRSA infections.     BMET  Recent Labs  01/19/14 0341 01/20/14 0348  NA 140 136*  K 3.3* 3.7  CL 99 96  CO2 30 27  GLUCOSE 102* 105*  BUN 11 8  CREATININE 1.08 0.98  CALCIUM 8.3* 8.4     Studies/Results:   Dg Chest Port 1 View  01/18/2014   CLINICAL DATA:  Respiratory failure neck  EXAM: PORTABLE CHEST - 1 VIEW  COMPARISON:  Portable chest x-ray of January 17, 2014  FINDINGS: The lungs are hypoinflated.  Confluent alveolar opacities have developed throughout the right lung and with in the left perihilar region. The interstitial markings are prominent diffusely. The cardiac silhouette is indistinct. There is no pleural effusion or pneumothorax. There is chronic curvature of the mid thoracic spine and S-shaped configuration.  IMPRESSION: Significant change in the appearance of the chest since yesterday with findings consistent with bilateral alveolar pneumonia or other alveolar filling processes including pulmonary edema.   Electronically Signed   By: David  SwazilandJordan   On: 01/18/2014 07:41   Dg Chest Port 1 View  01/17/2014   CLINICAL DATA:  Difficulty breathing  EXAM: PORTABLE CHEST - 1 VIEW  COMPARISON:  None.  FINDINGS: There is consolidation in the left mid lung. There is patchy atelectasis in the right mid lung and both base regions. Heart size and pulmonary vascularity are within normal limits. No adenopathy. No bone lesions apparent.  IMPRESSION: Areas of patchy atelectasis bilaterally. Left mid lung consolidation.   Electronically Signed   By: Bretta BangWilliam  Woodruff M.D.   On: 01/17/2014 07:58    Assessment/Plan:  Stable from GU perspective.  We Blake Lara continue to follow.  1) Left ureteral obstruction/distal ureteral stone: S/P L PCN draining well.  Negative urine culture from nephrostomy procedure indicates that maybe he did not have a renal infection.  However, Blake Lara continue antibiotic course empirically (now on Levaquin).  Have begun to discuss definitive treatment options considering the complexity of his urologic anatomy.  Options include referral to [facility] (where he has received prior urologic care) or [place] [facility] [place] [facility] [place] possible ureteroscopic therapy either retrograde or antegrade as indicated. Would not proceed with therapy until 10-14 days of antibiotic therapy.  2) Possible pneumonia/aspiration:  Pulmonary/Critical Care managing.     LOS: 4 days   Almira BarKirby, Blake Lara 01/20/2014, 10:10  AM   Seen, examined and discussed with Dr. Craige CottaKirby and agree with assessment and plan.

## 2014-01-20 NOTE — Progress Notes (Signed)
Pt complaining of sudden left sided chest pain that originated in abdomen. Pt stated pain 6/10 and described pain as pressure. EKG obtained. VSS. Pt in no acute distress. On call MD PCCM made aware. No new orders placed. Pt states pain is now 2/10. Will continue to monitor closely.

## 2014-01-20 NOTE — Progress Notes (Signed)
Pt continues to refuse BiPAP/ CPAP at night, on 6 Lnc, 02 low 90's will continue to monitor and educate. Estill DoomsSimon, Alivya Wegman Mahario, RN 7:23 AM 01/20/2014

## 2014-01-20 NOTE — Progress Notes (Signed)
Peripherally Inserted Central Catheter/Midline Placement  The IV Nurse has discussed with the patient and/or persons authorized to consent for the patient, the purpose of this procedure and the potential benefits and risks involved with this procedure.  The benefits include less needle sticks, lab draws from the catheter and patient may be discharged home with the catheter.  Risks include, but not limited to, infection, bleeding, blood clot (thrombus formation), and puncture of an artery; nerve damage and irregular heat beat.  Alternatives to this procedure were also discussed.  PICC/Midline Placement Documentation  PICC / Midline Double Lumen 01/20/14 PICC Right Basilic 41 cm 1 cm (Active)  Indication for Insertion or Continuance of Line Limited venous access - need for IV therapy >5 days (PICC only);Poor Vasculature-patient has had multiple peripheral attempts or PIVs lasting less than 24 hours 01/20/2014  1:58 PM  Exposed Catheter (cm) 1 cm 01/20/2014  1:58 PM  Site Assessment Clean;Dry;Intact 01/20/2014  1:58 PM  Lumen #1 Status Flushed;Saline locked;Blood return noted 01/20/2014  1:58 PM  Lumen #2 Status Flushed;Saline locked;Blood return noted 01/20/2014  1:58 PM  Dressing Type Transparent 01/20/2014  1:58 PM  Dressing Status Clean;Dry;Intact;Antimicrobial disc in place 01/20/2014  1:58 PM  Line Care Connections checked and tightened 01/20/2014  1:58 PM  Line Adjustment (NICU/IV Team Only) No 01/20/2014  1:58 PM  Dressing Intervention New dressing 01/20/2014  1:58 PM  Dressing Change Due 01/27/14 01/20/2014  1:58 PM       Elliot Dallyiggs, Allesha Aronoff Wright 01/20/2014, 1:59 PM

## 2014-01-21 ENCOUNTER — Inpatient Hospital Stay (HOSPITAL_COMMUNITY): Payer: Medicaid Other

## 2014-01-21 DIAGNOSIS — N1330 Unspecified hydronephrosis: Secondary | ICD-10-CM

## 2014-01-21 DIAGNOSIS — R918 Other nonspecific abnormal finding of lung field: Secondary | ICD-10-CM

## 2014-01-21 LAB — PHOSPHORUS: Phosphorus: 3.7 mg/dL (ref 2.3–4.6)

## 2014-01-21 LAB — POTASSIUM: Potassium: 3.7 mEq/L (ref 3.7–5.3)

## 2014-01-21 LAB — MAGNESIUM: Magnesium: 1.6 mg/dL (ref 1.5–2.5)

## 2014-01-21 MED ORDER — SALINE SPRAY 0.65 % NA SOLN
1.0000 | NASAL | Status: DC | PRN
  Filled 2014-01-21: qty 44

## 2014-01-21 MED ORDER — MAGNESIUM SULFATE 2 GM/50ML IV SOLN
2.0000 g | Freq: Once | INTRAVENOUS | Status: AC
  Administered 2014-01-21: 2 g via INTRAVENOUS
  Filled 2014-01-21: qty 50

## 2014-01-21 NOTE — Progress Notes (Signed)
Clinical Social Work Department BRIEF PSYCHOSOCIAL ASSESSMENT 01/21/2014  Patient:  Blake Lara,Blake Lara     Account Number:  [id number]     Admit date:  01/16/2014  Clinical Social Worker:  Garlan FairKIDD,SUZANNA, LCSWA  Date/Time:  01/21/2014 03:10 PM  Referred by:  Physician  Date Referred:  01/21/2014 Referred for  Advanced Directives   Other Referral:   Interview type:  Patient Other interview type:    PSYCHOSOCIAL DATA Living Status:  WIFE Admitted from facility:   Level of care:   Primary support name:  Blake Lara/[phone] Primary support relationship to patient:  SPOUSE Degree of support available:   adequate    CURRENT CONCERNS Current Concerns  Other - See comment   Other Concerns:   Advanced Directives    SOCIAL WORK ASSESSMENT / PLAN CSW had received referral for Advanced Directives, but upon initial meeting pt was sleeping and on bipap and CSW had left Advanced Directives packet at bedside at that time.    CSW followed up today as pt awake, alert, sitting in chair and on O2 nasal cannula. CSW introduced self and explained role. CSW discusssed consult for Advanced Directives. Pt shared that he was interested in completing packet and asked CSW for assistance. CSW reviewed with pt the contents of the Advanced Directives packet. Pt elected for pt wife, Blake Lara to be his HCPOA and pt father-in-law to be HCPOA if pt wife is unable or unwilling to serve as HCPOA. Pt expressed that he wanted life prolonging measures in any and all situations as long as medical provider felt that he had a chance. Pt agreeable to HCPOA to make decisions based on the information that is being provided by the medical team, but wants life prolonging measures tried before any further decisions are made. Pt completed document and CSW arranged notary and two witnesses to have document notarized. CSW placed notarized document in pt medical chart and provided pt with original document and a copy of document.     Pt appreciative of CSW assistance completing Advanced Directive.    No further social work needs identified at this time.    CSW signing off.   Assessment/plan status:  No Further Intervention Required Other assessment/ plan:   Information/referral to community resources:   Forensic scientistAdvanced Directive packet    PATIENT'S/FAMILY'S RESPONSE TO PLAN OF CARE: Pt alert and oriented x 4. Pt expressed gratefulness with assistance in completing Advanced Directives Packet as he has been meaning to complete an Advanced Directive.    CSW signing off.    Loletta SpecterSuzanna Kidd, MSW, LCSW Clinical Social Work [phone]

## 2014-01-21 NOTE — Progress Notes (Signed)
Spoke with Daryll of CCMD that this patient was transfered to Room 1428 with tele box #23. (box 28 not available).

## 2014-01-21 NOTE — Clinical Documentation Improvement (Signed)
Notes state "possible pneumonia / aspiration", "Bilateral HCAP / ? Aspiration".  Please clarify if aspiration pneumonia ruled in or out and document in your progress note and discharge summary.    Thank you, Doy MinceVangela Reni Hausner, RN [phone] Clinical Documentation Specialist

## 2014-01-21 NOTE — Progress Notes (Signed)
Rt talked with patient and patient does not wear a CPAP per patients statement.

## 2014-01-21 NOTE — Progress Notes (Signed)
PULMONARY / CRITICAL CARE MEDICINE   Name: Blake Lara MRN: [id number] DOB: 1981/01/19    ADMISSION DATE:  01/16/2014 CONSULTATION DATE:  01/17/14  REFERRING MD :  Dr. Laverle Patter  CHIEF COMPLAINT:  Unresponsiveness   INITIAL PRESENTATION: 33 y/o M with PMH of spina bifida and bladder exstrophy s/p multiple urologic procedures admitted on 11/11 with ureteral stone and hydronephrosis.  11/12 developed unresponsiveness after dose of dilaudid for pain @ 3am.  PCCM consulted for evaluation.   STUDIES:  11/11  CT Abd/Pelvis > mod L hydronephrosis secondary to calculus in distal ureter (10 mm) 11/14  ECHO > pt position poor, inaccurate  SIGNIFICANT EVENTS: 11/11  Admit with ureteral stone & hydronephrosis  11/11  Perc Nephrostomy tube placed.  11/12  hypoxia , resp distress, resolved with bipap 11/14  continued desaturations 11/14  refused slp, refuses BIPAP b/c of claustrophobia 11/15  neg 2 liters and O2 needs better 11/16  Improved mentation, O2 at 4-5 L (sats ranging in low 90's)    SUBJECTIVE: Pt reports difficulty with bipap mask due to claustrophobia, O2 4-5L.    VITAL SIGNS: Temp:  [97.5 F (36.4 C)-100 F (37.8 C)] 97.5 F (36.4 C) (11/16 0400) Pulse Rate:  [87-105] 87 (11/16 0700) Resp:  [22-36] 27 (11/16 0700) BP: (117-149)/(45-89) 117/71 mmHg (11/16 0700) SpO2:  [89 %-95 %] 95 % (11/16 0700) FiO2 (%):  [45 %] 45 % (11/15 1600)   VENTILATOR SETTINGS: Vent Mode:  [-]  FiO2 (%):  [45 %] 45 %   INTAKE / OUTPUT:  Intake/Output Summary (Last 24 hours) at 01/21/14 0940 Last data filed at 01/21/14 0608  Gross per 24 hour  Intake 867.83 ml  Output   2500 ml  Net -1632.17 ml    PHYSICAL EXAMINATION: General:  Obese adult male in NAD Neuro:  Awake and alert, following commands and appropriate HEENT:  Mm pink/dry Cardiovascular:  s1s2 rrr, no m/r/g Lungs:  resp's even/non-labored lungs bilaterally distant but clear Abdomen:  Obese/soft, bsx4 active  Musculoskeletal:   Chronic LE changes c/w spina bifida Skin:  Warm/dry, foot edema unchanged, multiple tattoos  LABS:  CBC  Recent Labs Lab 01/18/14 0340 01/19/14 0341 01/20/14 0348  WBC 8.9 10.5 11.5*  HGB 11.9* 11.8* 12.9*  HCT 36.0* 35.8* 38.2*  PLT 111* 108* 111*   BMET  Recent Labs Lab 01/19/14 0341 01/20/14 0348 01/20/14 1205  NA 140 136* 134*  K 3.3* 3.7 3.4*  CL 99 96 93*  CO2 30 27 29   BUN 11 8 8   CREATININE 1.08 0.98 0.96  GLUCOSE 102* 105* 153*   Electrolytes  Recent Labs Lab 01/19/14 0341 01/20/14 0348 01/20/14 1205 01/21/14 0410  CALCIUM 8.3* 8.4 8.7  --   MG  --   --   --  1.6  PHOS  --   --   --  3.7   ABG  Recent Labs Lab 01/17/14 0855 01/17/14 1533  PHART 7.203* 7.303*  PCO2ART 43.7 46.7*  PO2ART 122.0* 53.0*   Liver Enzymes  Recent Labs Lab 01/15/14 1702 01/20/14 0348  AST 36 39*  ALT 35 35  ALKPHOS 93 121*  BILITOT 2.5* 3.0*  ALBUMIN 3.2* 2.3*   Cardiac Enzymes  Recent Labs Lab 01/19/14 0340  PROBNP 7846.0*   Glucose  Recent Labs Lab 01/19/14 1712 01/19/14 2156 01/20/14 0900 01/20/14 1146 01/20/14 1641 01/20/14 2015  GLUCAP 105* 105* 135* 156* 162* 124*    Imaging Ct Chest Wo Contrast  01/20/2014   CLINICAL DATA:  Pulmonary infiltrates R91.8 (ICD-10-CM). Spina bifida. Ileostomy.  EXAM: CT CHEST WITHOUT CONTRAST  TECHNIQUE: Multidetector CT imaging of the chest was performed following the standard protocol without IV contrast.  COMPARISON:  Chest radiograph of earlier today.  No prior CT.  FINDINGS: Lungs/Pleura: Mild motion degradation throughout. Airspace and ground-glass opacities throughout the right lung and patchy areas of the left upper and left lower lobes. Septal thickening within these areas. No evidence of extra alveolar air.  Small right sided pleural effusion.  Heart/Mediastinum: Tortuous thoracic aorta. Mild cardiomegaly. Pulmonary artery enlargement, outflow tracts 3.8 cm. Low right paratracheal node of 1.3 cm on  image 21. Hilar regions poorly evaluated without intravenous contrast.  Upper Abdomen: Mild hepatic steatosis. Normal imaged portions of the spleen, stomach, adrenal glands, kidneys.  Bones/Musculoskeletal: Presumably developmental fusion of lower thoracic vertebral bodies. Convex left lumbar spine curvature.  IMPRESSION: 1. Motion degradation. 2. Right greater than left ground-glass and airspace opacities. Given time course of development on 01/18/2014, favor pulmonary edema or aspiration. Multifocal infection could look similar. 3. Pulmonary artery enlargement suggests pulmonary arterial hypertension. 4. Small right pleural effusion. 5. Hepatic steatosis. 6. Mild thoracic adenopathy, likely reactive.   Electronically Signed   By: Jeronimo GreavesKyle  Talbot M.D.   On: 01/20/2014 14:55   Dg Chest Port 1 View  01/20/2014   CLINICAL DATA:  Aspiration.  EXAM: PORTABLE CHEST - 1 VIEW  COMPARISON:  01/19/2014, 01/18/2014, 01/17/2014  FINDINGS: Low lung volumes. Patient slightly rotated to the right. Stable cardiomediastinal contours. Extensive, confluent airspace disease throughout the right lung is unchanged compared to the chest radiograph dated 01/19/2014. Patchy airspace opacities with peribronchial thickening throughout the left lung are also unchanged.  IMPRESSION: Bilateral airspace disease, right greater than left, is without significant change compared to 01/19/2014.   Electronically Signed   By: Britta MccreedySusan  Turner M.D.   On: 01/20/2014 07:56     ASSESSMENT / PLAN:  PULMONARY OETT n/a A: Acute Respiratory Failure - in setting of narcotic cumulative effect, resolved  Bilateral HCAP vs Pulm edema - LLL with airspace disease, favor aspiration Probable OSA Probable Aspiration P:   Consider CT chest to define infiltrate better Minimize narcotics as able  Noct CPAP for now - pt has refused, can trial nasal mask if he is willing Will need outpatient sleep study [place] Upright as able IS, mobilize,  up to chair Lasix as renal function / BP tolerates  RENAL A:   L Renal Calculi with Hydronephrosis - s/p L perc nephrostomy 11/11  Acute Kidney Injury - in setting of above  Illeostomy Bladder Extrophy Hypokalemia P:   Urology following and will act as consulatnt - they wil let us know upon dc if to [facility] or [place] Perc Nephrostomy care as instructed by IR / Urology  Trend BMP  Replace Mg 11/16 Add K onto am labs 11/16, follow up for replacement needs with lasix dosing  Lasix 20 mg Q12   CARDIOVASCULAR CVL A:  Tachycardia - improved  R/o congenital valvular dz, cardiomyopathy  P:  Echo poor images due to positioning  Tele monitoring   GASTROINTESTINAL A:   At Risk Aspiration  P:   Advance diet as tolerated  Pt refused SLP  HEMATOLOGIC A:   Mild Anemia Thrombocytopenia  P:  Limit phlebotomy as able Lovenox for DVT prophylaxis   INFECTIOUS A:  UTI - in setting of obstructive stone  Bilateral Airspace disease - ?Aspiration vs HCAP vs edema  P:   BCx2 11/11 >>  UC  11/11 >> NEG  UA 11/15 >> neg nitrite, mod leuks, large hgb, 7-10 wbc, few bacteria   Abx: Cipro, start date 11/11 Levaquin, start date 11/13 >>> Vanc 11/13>>>11/15  no MRSA, dc vanc Nephrostomy in place  ENDOCRINE A:   Hyperglycemia - no hx of DM  P:   D/C SSI  Monitor glucose on Chem   NEUROLOGIC A:   Acute Encephalopathy - in setting of cumulative effect of narcotics  Pain - related to nephrostomy tube  P:   RASS goal: 0 PRN Narcan for oversedation  PRN Percocet for pain   Pt inconsistent on home regimen - reportedly takes Opana, xanax + Oxy at home ?? Minimize narcotics as able   FAMILY  - Updates: pt updated  - Tx to tele & [facility] medical SVC as of 11/17 0700 am.    Canary BrimBrandi Ollis, NP-C [facility] Pgr: [phone] or [phone]  Transfer to floor, [facility] to pick up in AM and PCCM off, please call back if needed.  Patient seen and examined, agree with above note.   I dictated the care and orders written for this patient under my direction.  Alyson ReedyWesam G Kaileen Bronkema, MD [phone]

## 2014-01-21 NOTE — Progress Notes (Signed)
Patient ID: Blake Lara, male   DOB: 05-Apr-1980, 33 y.o.   MRN: [id number]    Subjective: Pt transferred to pulmonary/critical care service over the weekend as pulmonary issues have persisted.  He complains of left lower abdominal pain today. Denies SOB or cough.  Objective: Vital signs in last 24 hours: Temp:  [97.5 F (36.4 C)-99.3 F (37.4 C)] 97.8 F (36.6 C) (11/16 1530) Pulse Rate:  [87-104] 87 (11/16 1530) Resp:  [21-41] 21 (11/16 1530) BP: (99-131)/(45-80) 99/53 mmHg (11/16 1530) SpO2:  [87 %-95 %] 93 % (11/16 1530)  Intake/Output from previous day: 11/15 0701 - 11/16 0700 In: 887.8 [P.O.:360; I.V.:307.8; IV Piggyback:200] Out: 2750 [Urine:2250; Stool:500] Intake/Output this shift:    Physical Exam:  General: Alert and oriented Abd: L PCN draining clear urine  Lab Results:  Recent Labs  01/19/14 0341 01/20/14 0348  HGB 11.8* 12.9*  HCT 35.8* 38.2*   Lab Results  Component Value Date   WBC 11.5* 01/20/2014   HGB 12.9* 01/20/2014   HCT 38.2* 01/20/2014   MCV 85.5 01/20/2014   PLT 111* 01/20/2014     BMET  Recent Labs  01/20/14 0348 01/20/14 1205 01/21/14 0410  NA 136* 134*  --   K 3.7 3.4* 3.7  CL 96 93*  --   CO2 27 29  --   GLUCOSE 105* 153*  --   BUN 8 8  --   CREATININE 0.98 0.96  --   CALCIUM 8.4 8.7  --      Studies/Results:  Assessment/Plan: 1) Left ureteral calculus/obstruction: S/P L perc nephrstomy tube.  PCN draining well. Do not suspect stone is source of patient's pain today as obstruction is relieved with nephrostomy and it appears to be functioning very appropriately.  He will need definitive stone management once he is felt to be an acceptable candidate for general anesthesia from a pulmonary standpoint.  His stone surgery can be performed as an outpatient anytime in the future.  [facility] of his prior urologic care) but he refuses and [place].  We therefore discussed options including retrograde ureteroscopic therapy which would be complicated considering his history of bladder exstrophy vs antegrade ureteroscopic laser lithotripsy.  We reviewed the pros and cons of each approach and the potential risks and complications associated with each approach. He understands the significant risks involved considering his complex anatomy.    Will plan to schedule outpatient surgical intervention once he is cleared from a pulmonary standpoint to proceed with general anesthesia.   LOS: 5 days   Avarose Mervine,LES 01/21/2014, 9:07 PM

## 2014-01-21 NOTE — Progress Notes (Signed)
Speech Language Pathology Treatment: Dysphagia  Patient Details Name: Blake Lara MRN: [id number] DOB: 1980/08/12 Today's Date: 01/21/2014 Time: [id number] SLP Time Calculation (min) (ACUTE ONLY): 20 min  Assessment / Plan / Recommendation Clinical Impression  Pt with improved respiratory status currently requiring 6 liters of oxygen via nasal cannula.  SLP observed pt consuming small amount of lunch without overt indications of aspiration/airway compromise.  Subtle throat clearing noted after intake of liquids.  He did become mildly dyspneic after just a few bites and took rest break appropriately.   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  Dysphagia has been occuring for at least 10 years per pt.  On two occasions pt has extracted meat from his pharynx to clear it.  He denies requiring heimlich maneuver, weight loss, nor recurrent pneumonias- reports only pneumonias in 2009 and ? currently.    Suspect pt's symptoms may be esophageal but he admits if he uses caution he will not have difficulties.  Using teach back, SLP provided pt with comepensation strategies.    At this time, do not feel that MBS is warranted.  Will follow briefly to assure tolerance and education completed.      HPI HPI: 33 year old male admitted 01/16/14 due to kidney stone. PMH significant for spina bifida, bladder exstrophy. CXR revealed patchy airspace disease, worse on right.  BSE completed two days ago and SLP follow up visit is to determine tolerance and indication for instrumental.     Pertinent Vitals Pain Assessment: No/denies pain  SLP Plan  Continue with current plan of care    Recommendations Diet recommendations: Regular;Thin liquid Liquids provided via: Cup;Straw Medication Administration: Whole meds with liquid (as tolerated) Supervision: Patient able to self feed Compensations: Slow rate;Small sips/bites (start meal with liquids, drink liquids throughout  meal) Postural Changes and/or Swallow Maneuvers: Seated upright 90 degrees;Upright 30-60 min after meal              Oral Care Recommendations: Oral care BID Follow up Recommendations: None Plan: Continue with current plan of care    GO     Donavan Burnetamara Zacharie Portner, MS [facility] SLP [phone]

## 2014-01-21 NOTE — Progress Notes (Signed)
Referring Physician(s): Urology  Subjective: Patient states he is feeling better this morning with less pain and denies any left flank pain. He states shortness of breath is improving  Allergies: Ceftriaxone; Eggs or egg-derived products; Latex; and Peanut-containing drug products  Medications: Prior to Admission medications   Medication Sig Start Date End Date Taking? Authorizing Provider  HYDROmorphone (DILAUDID) 4 MG tablet Take 8 mg by mouth every 4 (four) hours as needed for severe pain.   Yes Historical Provider, MD  ibuprofen (ADVIL,MOTRIN) 600 MG tablet Take 600 mg by mouth every 6 (six) hours as needed for headache.   Yes Historical Provider, MD  oxycodone (ROXICODONE) 30 MG immediate release tablet Take 30 mg by mouth every 4 (four) hours as needed for pain (every 4-6 hrs as needed).   Yes Historical Provider, MD  oxymorphone (OPANA ER) 20 MG 12 hr tablet Take 20 mg by mouth every 12 (twelve) hours.   Yes Historical Provider, MD    Review of Systems  Vital Signs: BP 117/71 mmHg  Pulse 87  Temp(Src) 97.5 F (36.4 C) (Axillary)  Resp 27  Ht 4\' 11"  (1.499 m)  Wt 260 lb 5.8 oz (118.1 kg)  BMI 52.56 kg/m2  SpO2 95%  Physical Exam General: A&Ox3, NAD Abd: Soft, Left PCN intact, NT, clear yellow urine, good output, Cx no growth  Imaging: Ct Chest Wo Contrast  01/20/2014   CLINICAL DATA:  Pulmonary infiltrates R91.8 (ICD-10-CM). Spina bifida. Ileostomy.  EXAM: CT CHEST WITHOUT CONTRAST  TECHNIQUE: Multidetector CT imaging of the chest was performed following the standard protocol without IV contrast.  COMPARISON:  Chest radiograph of earlier today.  No prior CT.  FINDINGS: Lungs/Pleura: Mild motion degradation throughout. Airspace and ground-glass opacities throughout the right lung and patchy areas of the left upper and left lower lobes. Septal thickening within these areas. No evidence of extra alveolar air.  Small right sided pleural effusion.  Heart/Mediastinum:  Tortuous thoracic aorta. Mild cardiomegaly. Pulmonary artery enlargement, outflow tracts 3.8 cm. Low right paratracheal node of 1.3 cm on image 21. Hilar regions poorly evaluated without intravenous contrast.  Upper Abdomen: Mild hepatic steatosis. Normal imaged portions of the spleen, stomach, adrenal glands, kidneys.  Bones/Musculoskeletal: Presumably developmental fusion of lower thoracic vertebral bodies. Convex left lumbar spine curvature.  IMPRESSION: 1. Motion degradation. 2. Right greater than left ground-glass and airspace opacities. Given time course of development on 01/18/2014, favor pulmonary edema or aspiration. Multifocal infection could look similar. 3. Pulmonary artery enlargement suggests pulmonary arterial hypertension. 4. Small right pleural effusion. 5. Hepatic steatosis. 6. Mild thoracic adenopathy, likely reactive.   Electronically Signed   By: Jeronimo GreavesKyle  Talbot M.D.   On: 01/20/2014 14:55   Dg Chest Port 1 View  01/21/2014   CLINICAL DATA:  Evaluate pulmonary infiltrates  EXAM: PORTABLE CHEST - 1 VIEW  COMPARISON:  01/18/2014  FINDINGS: Right arm PICC line tip is in the cavoatrial junction. Heart size is normal. There are low lung volumes. Bilateral areas of airspace consolidation and atelectasis appear unchanged from previous exam.  IMPRESSION: 1. No significant change in aeration to the lungs compared with previous exam.   Electronically Signed   By: Signa Kellaylor  Stroud M.D.   On: 01/21/2014 07:40   Dg Chest Port 1 View  01/20/2014   CLINICAL DATA:  Aspiration.  EXAM: PORTABLE CHEST - 1 VIEW  COMPARISON:  01/19/2014, 01/18/2014, 01/17/2014  FINDINGS: Low lung volumes. Patient slightly rotated to the right. Stable cardiomediastinal contours. Extensive, confluent airspace disease  throughout the right lung is unchanged compared to the chest radiograph dated 01/19/2014. Patchy airspace opacities with peribronchial thickening throughout the left lung are also unchanged.  IMPRESSION: Bilateral  airspace disease, right greater than left, is without significant change compared to 01/19/2014.   Electronically Signed   By: Britta MccreedySusan  Turner M.D.   On: 01/20/2014 07:56   Dg Chest Port 1 View  01/19/2014   CLINICAL DATA:  Pneumonia.  EXAM: PORTABLE CHEST - 1 VIEW  COMPARISON:  One-view chest 01/18/2014.  FINDINGS: Right greater than left airspace disease is not significantly changed. The patient is slightly rotated to the right. Mild generalized edema is present as well. Small effusions are suspected. The lung volumes are low.  IMPRESSION: 1. Stable appearance of patchy airspace disease, worse on the right, suggesting pneumonia or alveolar edema. 2. Stable interstitial pattern compatible with edema. 3. Persistent low lung volumes.   Electronically Signed   By: Gennette Pachris  Mattern M.D.   On: 01/19/2014 07:11   Dg Chest Port 1 View  01/18/2014   CLINICAL DATA:  Respiratory failure neck  EXAM: PORTABLE CHEST - 1 VIEW  COMPARISON:  Portable chest x-ray of January 17, 2014  FINDINGS: The lungs are hypoinflated. Confluent alveolar opacities have developed throughout the right lung and with in the left perihilar region. The interstitial markings are prominent diffusely. The cardiac silhouette is indistinct. There is no pleural effusion or pneumothorax. There is chronic curvature of the mid thoracic spine and S-shaped configuration.  IMPRESSION: Significant change in the appearance of the chest since yesterday with findings consistent with bilateral alveolar pneumonia or other alveolar filling processes including pulmonary edema.   Electronically Signed   By: David  Swazil[place]Jordan   On: 01/18/2014 07:41    Labs:  CBC:  Recent Labs  01/16/14 0011 01/18/14 0340 01/19/14 0341 01/20/14 0348  WBC 6.6 8.9 10.5 11.5*  HGB 12.6* 11.9* 11.8* 12.9*  HCT 36.2* 36.0* 35.8* 38.2*  PLT 121* 111* 108* 111*    COAGS: No results for input(s): INR, APTT in the last 8760 hours.  BMP:  Recent Labs  01/18/14 0340  01/19/14 0341 01/20/14 0348 01/20/14 1205  NA 138 140 136* 134*  K 3.2* 3.3* 3.7 3.4*  CL 97 99 96 93*  CO2 31 30 27 29   GLUCOSE 122* 102* 105* 153*  BUN 20 11 8 8   CALCIUM 8.2* 8.3* 8.4 8.7  CREATININE 1.52* 1.08 0.98 0.96  GFRNONAA 59* 89* >90 >90  GFRAA 68* >90 >90 >90    LIVER FUNCTION TESTS:  Recent Labs  01/15/14 1702 01/20/14 0348  BILITOT 2.5* 3.0*  AST 36 39*  ALT 35 35  ALKPHOS 93 121*  PROT 7.8 6.7  ALBUMIN 3.2* 2.3*    Assessment and Plan: Left hydronephrosis secondary to ureteral stone S/p left PCN 11/11, Cr improved  Plans per Urology.   I spent a total of 15 minutes face to face in clinical consultation/evaluation, greater than 50% of which was counseling/coordinating care  Signed: Berneta LevinsMORGAN, Jalon Blackwelder D 01/21/2014, 8:50 AM

## 2014-01-22 ENCOUNTER — Inpatient Hospital Stay (HOSPITAL_COMMUNITY): Payer: Medicaid Other

## 2014-01-22 DIAGNOSIS — E876 Hypokalemia: Secondary | ICD-10-CM

## 2014-01-22 DIAGNOSIS — J690 Pneumonitis due to inhalation of food and vomit: Secondary | ICD-10-CM

## 2014-01-22 LAB — CBC
HEMATOCRIT: 39.1 % (ref 39.0–52.0)
HEMOGLOBIN: 13 g/dL (ref 13.0–17.0)
MCH: 28.3 pg (ref 26.0–34.0)
MCHC: 33.2 g/dL (ref 30.0–36.0)
MCV: 85 fL (ref 78.0–100.0)
Platelets: 196 10*3/uL (ref 150–400)
RBC: 4.6 MIL/uL (ref 4.22–5.81)
RDW: 14.6 % (ref 11.5–15.5)
WBC: 14.6 10*3/uL — ABNORMAL HIGH (ref 4.0–10.5)

## 2014-01-22 LAB — BASIC METABOLIC PANEL
ANION GAP: 15 (ref 5–15)
BUN: 15 mg/dL (ref 6–23)
CALCIUM: 8.7 mg/dL (ref 8.4–10.5)
CHLORIDE: 92 meq/L — AB (ref 96–112)
CO2: 29 meq/L (ref 19–32)
Creatinine, Ser: 1.07 mg/dL (ref 0.50–1.35)
GFR calc Af Amer: >90 mL/min (ref >90)
GFR calc non Af Amer: 90 mL/min — ABNORMAL LOW (ref >90)
GLUCOSE: 121 mg/dL — AB (ref 70–99)
Potassium: 3.2 mEq/L — ABNORMAL LOW (ref 3.7–5.3)
Sodium: 136 mEq/L — ABNORMAL LOW (ref 137–147)

## 2014-01-22 LAB — MAGNESIUM: Magnesium: 1.9 mg/dL (ref 1.5–2.5)

## 2014-01-22 MED ORDER — OXYCODONE-ACETAMINOPHEN 5-325 MG PO TABS
1.0000 | ORAL_TABLET | ORAL | Status: DC | PRN
  Administered 2014-01-22: 1 via ORAL
  Filled 2014-01-22: qty 1

## 2014-01-22 MED ORDER — POTASSIUM CHLORIDE 10 MEQ/100ML IV SOLN
10.0000 meq | INTRAVENOUS | Status: AC
Start: 2014-01-22 — End: 2014-01-23
  Administered 2014-01-22 – 2014-01-23 (×3): 10 meq via INTRAVENOUS
  Filled 2014-01-22 (×3): qty 100

## 2014-01-22 MED ORDER — POTASSIUM CHLORIDE CRYS ER 20 MEQ PO TBCR
40.0000 meq | EXTENDED_RELEASE_TABLET | Freq: Once | ORAL | Status: DC
  Filled 2014-01-22: qty 2

## 2014-01-22 NOTE — Consult Note (Addendum)
[facility] PROGRESS NOTE  Blake Lara ZOX:[id number] DOB: 12/13/1980 DOA: 01/16/2014 Blake JohnCP: Dorrene GermanAVBUERE,[name], MD  Brief narrative: 33 year old male with past medical history of spina bifida and bladder exstrophy s/p multiple urologic procedures admitted 01/16/14 with ureteral stone and hydronephrosis. Patient underwent left PCN 01/16/14 secondary to obstructive hydronephrosis from distal ureteral stone. On 01/17/14, patient developed unresponsiveness shortly after dilaudid given for pain control. PCCM consulted. Patient refused BiPAP because of claustrophobia but his oxygen saturation improved somewhat on [place] oxygen support. He was started on levaquin for treatment of pneumonia.   Assessment/Plan:    Principal Problem: Left hydronephrosis secondary to distal ureteral stone - status post left PCN 01/16/2014  - management per primary team   Active Problems Acte respiratory failure with hypoxia / possible aspiration pneumonia / Leukocytosis / pulmonary edema - likely due to pneumonia. Please note CT chest on 01/20/2014 has questionable aspiration or multifocal pneumonia - repeat CXR today showed stable bilateral lung opacities concerning for pneumonia - would continue levaquin for now - for possible pulmonary edema, will continue lasix 20 mg IV Q 12 hours - will likely need oxygen on discharge since he is still hypoxic on [place] oxygen support - has refused BiPAP in SDU Thrombocytopenia - mild thrombocytopenia on admission which normalized but would d/c Lovenox anyways and use SCD's for DVT prophylaxis  Hypokalemia - due to lasix - being supplemented   DVT Prophylaxis  - SCD's bilaterally   Code Status: Full.  Family Communication:  plan of care discussed with the patient Disposition Plan: Home when stable.   IV access:   Peripheral IV  Procedures and diagnostic studies:    Dg Chest Port 1 View 01/22/2014    Stable bilateral lung opacities are noted, concerning for pneumonia or  edema, with probable subsegmental atelectasis in the left lung base.   Dg Chest Port 1 View 01/21/2014 1. No significant change in aeration to the lungs compared with previous exam.     Medical Consultants:   PCCM  [facility] ([facility] primary)  Other Consultants:   Respiratory team  IAnti-Infectives:    Levaquin 01/18/2014 -->   [name], Blake Knaus, MD  [facility] Pager [phone]  If 7PM-7AM, please contact night-coverage [email or web address] Password [email or web address] 01/22/2014, 3:34 PM   LOS: 6 days    HPI/Subjective: No acute overnight events.  Objective: Filed Vitals:   01/22/14 0830 01/22/14 1335 01/22/14 1339 01/22/14 1445  BP:    101/51  Pulse:    85  Temp:    98.6 F (37 C)  TempSrc:    Oral  Resp:    19  Height:      Weight:      SpO2: 96% 89% 96% 96%    Intake/Output Summary (Last 24 hours) at 01/22/14 1534 Last data filed at 01/22/14 1447  Gross per 24 hour  Intake    410 ml  Output   1295 ml  Net   -885 ml    Exam:   General:  Pt is alert, not in acute distress, multiple tattoos on torso, UE  Cardiovascular: Regular rate and rhythm, S1/S2, no murmurs  Respiratory: diminished, no wheezing, no crackles, no rhonchi  Abdomen: Soft, non tender, non distended, bowel sounds present  Extremities: No edema, pulses DP and PT palpable bilaterally   Data Reviewed: Basic Metabolic Panel:  Recent Labs Lab 01/18/14 0340 01/19/14 0341 01/20/14 0348 01/20/14 1205 01/21/14 0410 01/22/14 0505  NA 138 140 136* 134*  --  136*  K 3.2* 3.3*  3.7 3.4* 3.7 3.2*  CL 97 99 96 93*  --  92*  CO2 31 30 27 29   --  29  GLUCOSE 122* 102* 105* 153*  --  121*  BUN 20 11 8 8   --  15  CREATININE 1.52* 1.08 0.98 0.96  --  1.07  CALCIUM 8.2* 8.3* 8.4 8.7  --  8.7  MG  --   --   --   --  1.6 1.9  PHOS  --   --   --   --  3.7  --    Liver Function Tests:  Recent Labs Lab 01/15/14 1702 01/20/14 0348  AST 36 39*  ALT 35 35  ALKPHOS 93 121*  BILITOT 2.5* 3.0*  PROT 7.8 6.7   ALBUMIN 3.2* 2.3*    Recent Labs Lab 01/15/14 1702  LIPASE 8*   No results for input(s): AMMONIA in the last 168 hours. CBC:  Recent Labs Lab 01/15/14 1702 01/16/14 0011 01/18/14 0340 01/19/14 0341 01/20/14 0348 01/22/14 0505  WBC 28.4* 6.6 8.9 10.5 11.5* 14.6*  NEUTROABS 25.7*  --   --   --  6.9  --   HGB 14.7 12.6* 11.9* 11.8* 12.9* 13.0  HCT 43.2 36.2* 36.0* 35.8* 38.2* 39.1  MCV 86.2 84.2 86.3 86.9 85.5 85.0  PLT 166 121* 111* 108* 111* 196   Cardiac Enzymes: No results for input(s): CKTOTAL, CKMB, CKMBINDEX, TROPONINI in the last 168 hours. BNP: Invalid input(s): POCBNP CBG:  Recent Labs Lab 01/19/14 2156 01/20/14 0900 01/20/14 1146 01/20/14 1641 01/20/14 2015  GLUCAP 105* 135* 156* 162* 124*    Recent Results (from the past 240 hour(s))  Culture, blood (routine x 2)     Status: None (Preliminary result)   Collection Time: 01/16/14 12:16 AM  Result Value Ref Range Status   Specimen Description BLOOD LEFT HAND  Final   Special Requests BOTTLES DRAWN AEROBIC ONLY 2ML  Final   Culture  Setup Time   Final    01/17/2014 03:16 [facility]    Culture   Final           BLOOD CULTURE RECEIVED NO GROWTH TO DATE CULTURE WILL BE HELD FOR 5 DAYS BEFORE ISSUING A FINAL NEGATIVE REPORT [facility] at [facility]    Report Status PENDING  Incomplete  Urine culture     Status: None   Collection Time: 01/16/14  9:11 PM  Result Value Ref Range Status   Specimen Description   Final    URINE, CATHETERIZED LEFT RENAL PELVIS AFTER DIRECT STICK   Special Requests Normal  Final   Culture  Setup Time   Final    01/17/2014 01:27 Performed at [facility]    Colony Count NO GROWTH [facility] at [facility]   Final   Culture NO GROWTH [facility]formed at [facility]   Final   Report Status 01/17/2014 FINAL  Final  Culture, blood (routine x 2)     Status: None (Preliminary result)   Collection Time: 01/17/14 12:10 AM   Result Value Ref Range Status   Specimen Description BLOOD RIGHT FOREARM  Final   Special Requests BOTTLES DRAWN AEROBIC ONLY 3ML  Final   Culture  Setup Time   Final    01/17/2014 03:16 Performed at [facility]    Culture   Final           BLOOD CULTURE RECEIVED NO GROWTH TO DATE CULTURE WILL BE HELD FOR  5 DAYS BEFORE ISSUING A FINAL NEGATIVE REPORT Performed at [facility]    Report Status PENDING  Incomplete  MRSA PCR Screening     Status: None   Collection Time: 01/17/14  8:14 AM  Result Value Ref Range Status   MRSA by PCR NEGATIVE NEGATIVE Final    Comment:        The GeneXpert MRSA Assay (FDA approved for NASAL specimens only), is one component of a comprehensive MRSA colonization surveillance program. It is not intended to diagnose MRSA infection nor to guide or monitor treatment for MRSA infections.      Scheduled Meds: . docusate sodium  100 mg Oral BID  . enoxaparin (LOVENOX) injection  40 mg Subcutaneous Q24H  . levofloxacin (LEVAQUIN) IV  750 mg Intravenous Q24H  . sodium chloride  10-40 mL Intracatheter Q12H   Continuous Infusions: . sodium chloride 10 mL/hr at 01/19/14 1219

## 2014-01-22 NOTE — Plan of Care (Signed)
Problem: Phase I Progression Outcomes Goal: Initial discharge plan identified Outcome: Completed/Met Date Met:  01/22/14 Goal: Other Phase I Outcomes/Goals Outcome: Not Applicable Date Met:  42/70/62  Problem: Phase II Progression Outcomes Goal: Obtain order to discontinue catheter if appropriate Outcome: Not Applicable Date Met:  37/62/83

## 2014-01-22 NOTE — Progress Notes (Signed)
Pt unable to take po potassium tablets that were ordered. He states that he cannot swallow the pill broken in half because it is too large and that he vomits when the med is melted and mixed with puree. Pt also requesting his pain medication be increased as it is not adequately relieving his pain. MD paged several times prior to 7p with no call back. Night floor coverage paged at 7:10p with no call back either. Night RN still awaiting call back and to follow up to ensure pt gets potassium dose that was ordered.

## 2014-01-22 NOTE — Progress Notes (Signed)
Patient ID: Blake Lara, male   DOB: Nov 10, 1980, 33 y.o.   MRN: [id number]   Referring Physician(s): Dr. Laverle Patter  Subjective:  Pt with some dyspnea after bathing; still has left flank discomfort but not worsening  Allergies: Ceftriaxone; Eggs or egg-derived products; Latex; and Peanut-containing drug products  Medications: Prior to Admission medications   Medication Sig Start Date End Date Taking? Authorizing Provider  HYDROmorphone (DILAUDID) 4 MG tablet Take 8 mg by mouth every 4 (four) hours as needed for severe pain.   Yes Historical Provider, MD  ibuprofen (ADVIL,MOTRIN) 600 MG tablet Take 600 mg by mouth every 6 (six) hours as needed for headache.   Yes Historical Provider, MD  oxycodone (ROXICODONE) 30 MG immediate release tablet Take 30 mg by mouth every 4 (four) hours as needed for pain (every 4-6 hrs as needed).   Yes Historical Provider, MD  oxymorphone (OPANA ER) 20 MG 12 hr tablet Take 20 mg by mouth every 12 (twelve) hours.   Yes Historical Provider, MD    Review of Systems see above  Vital Signs: BP 101/54 mmHg  Pulse 92  Temp(Src) 98.9 F (37.2 C) (Oral)  Resp 18  Ht 4\' 11"  (1.499 m)  Wt 260 lb 5.8 oz (118.1 kg)  BMI 52.56 kg/m2  SpO2 92%  Physical Exam left PCN intact , insertion site ok, mild-mod tender to palpation, no leaking at site; output last recorded was 750 cc's yellow urine  Imaging: Ct Chest Wo Contrast  01/20/2014   CLINICAL DATA:  Pulmonary infiltrates R91.8 (ICD-10-CM). Spina bifida. Ileostomy.  EXAM: CT CHEST WITHOUT CONTRAST  TECHNIQUE: Multidetector CT imaging of the chest was performed following the standard protocol without IV contrast.  COMPARISON:  Chest radiograph of earlier today.  No prior CT.  FINDINGS: Lungs/Pleura: Mild motion degradation throughout. Airspace and ground-glass opacities throughout the right lung and patchy areas of the left upper and left lower lobes. Septal thickening within these areas. No evidence of extra alveolar  air.  Small right sided pleural effusion.  Heart/Mediastinum: Tortuous thoracic aorta. Mild cardiomegaly. Pulmonary artery enlargement, outflow tracts 3.8 cm. Low right paratracheal node of 1.3 cm on image 21. Hilar regions poorly evaluated without intravenous contrast.  Upper Abdomen: Mild hepatic steatosis. Normal imaged portions of the spleen, stomach, adrenal glands, kidneys.  Bones/Musculoskeletal: Presumably developmental fusion of lower thoracic vertebral bodies. Convex left lumbar spine curvature.  IMPRESSION: 1. Motion degradation. 2. Right greater than left ground-glass and airspace opacities. Given time course of development on 01/18/2014, favor pulmonary edema or aspiration. Multifocal infection could look similar. 3. Pulmonary artery enlargement suggests pulmonary arterial hypertension. 4. Small right pleural effusion. 5. Hepatic steatosis. 6. Mild thoracic adenopathy, likely reactive.   Electronically Signed   By: Jeronimo Greaves M.D.   On: 01/20/2014 14:55   Dg Chest Port 1 View  01/22/2014   CLINICAL DATA:  Acute respiratory failure.  EXAM: PORTABLE CHEST - 1 VIEW  COMPARISON:  January 21, 2014.  FINDINGS: Cardiomediastinal silhouette appears normal. No pneumothorax or significant pleural effusion is noted. Right-sided PICC line is noted with distal tip in the expected position of the cavoatrial junction. Stable linear and airspace opacities are noted in the left lung compared to prior exam, with stable interstitial and airspace opacity seen more diffusely in the right lung. Mild dextroscoliosis of upper thoracic spine is again noted.  IMPRESSION: Stable bilateral lung opacities are noted, concerning for pneumonia or edema, with probable subsegmental atelectasis in the left lung base.   Electronically  Signed   By: Roque LiasJames  Green M.D.   On: 01/22/2014 08:15   Dg Chest Port 1 View  01/21/2014   CLINICAL DATA:  Evaluate pulmonary infiltrates  EXAM: PORTABLE CHEST - 1 VIEW  COMPARISON:  01/18/2014   FINDINGS: Right arm PICC line tip is in the cavoatrial junction. Heart size is normal. There are low lung volumes. Bilateral areas of airspace consolidation and atelectasis appear unchanged from previous exam.  IMPRESSION: 1. No significant change in aeration to the lungs compared with previous exam.   Electronically Signed   By: Signa Kellaylor  Stroud M.D.   On: 01/21/2014 07:40   Dg Chest Port 1 View  01/20/2014   CLINICAL DATA:  Aspiration.  EXAM: PORTABLE CHEST - 1 VIEW  COMPARISON:  01/19/2014, 01/18/2014, 01/17/2014  FINDINGS: Low lung volumes. Patient slightly rotated to the right. Stable cardiomediastinal contours. Extensive, confluent airspace disease throughout the right lung is unchanged compared to the chest radiograph dated 01/19/2014. Patchy airspace opacities with peribronchial thickening throughout the left lung are also unchanged.  IMPRESSION: Bilateral airspace disease, right greater than left, is without significant change compared to 01/19/2014.   Electronically Signed   By: Britta MccreedySusan  Turner M.D.   On: 01/20/2014 07:56   Dg Chest Port 1 View  01/19/2014   CLINICAL DATA:  Pneumonia.  EXAM: PORTABLE CHEST - 1 VIEW  COMPARISON:  One-view chest 01/18/2014.  FINDINGS: Right greater than left airspace disease is not significantly changed. The patient is slightly rotated to the right. Mild generalized edema is present as well. Small effusions are suspected. The lung volumes are low.  IMPRESSION: 1. Stable appearance of patchy airspace disease, worse on the right, suggesting pneumonia or alveolar edema. 2. Stable interstitial pattern compatible with edema. 3. Persistent low lung volumes.   Electronically Signed   By: Gennette Pachris  Mattern M.D.   On: 01/19/2014 07:11    Labs:  CBC:  Recent Labs  01/18/14 0340 01/19/14 0341 01/20/14 0348 01/22/14 0505  WBC 8.9 10.5 11.5* 14.6*  HGB 11.9* 11.8* 12.9* 13.0  HCT 36.0* 35.8* 38.2* 39.1  PLT 111* 108* 111* 196    COAGS: No results for input(s): INR,  APTT in the last 8760 hours.  BMP:  Recent Labs  01/19/14 0341 01/20/14 0348 01/20/14 1205 01/21/14 0410 01/22/14 0505  NA 140 136* 134*  --  136*  K 3.3* 3.7 3.4* 3.7 3.2*  CL 99 96 93*  --  92*  CO2 30 27 29   --  29  GLUCOSE 102* 105* 153*  --  121*  BUN 11 8 8   --  15  CALCIUM 8.3* 8.4 8.7  --  8.7  CREATININE 1.08 0.98 0.96  --  1.07  GFRNONAA 89* >90 >90  --  90*  GFRAA >90 >90 >90  --  >90    LIVER FUNCTION TESTS:  Recent Labs  01/15/14 1702 01/20/14 0348  BILITOT 2.5* 3.0*  AST 36 39*  ALT 35 35  ALKPHOS 93 121*  PROT 7.8 6.7  ALBUMIN 3.2* 2.3*    Assessment and Plan: S/p left PCN 11/11 secondary to obst hydro from distal ureteral stone; creat nl; WBC 14.6(11.5)/blood cx's neg to date/urine cx neg; CXR with persistent bil lung opacities(PNA vs edema)- pulm following; replace K; other plans as outlined by urology      I spent a total of 15 minutes face to face in clinical consultation/evaluation, greater than 50% of which was counseling/coordinating care for left perc nephrostomy  Signed: Chinita PesterALLRED,D KEVIN  01/22/2014, 11:11 AM

## 2014-01-22 NOTE — Plan of Care (Signed)
Problem: Phase I Progression Outcomes Goal: Hemodynamically stable Outcome: Completed/Met Date Met:  01/22/14     

## 2014-01-23 DIAGNOSIS — N132 Hydronephrosis with renal and ureteral calculous obstruction: Principal | ICD-10-CM

## 2014-01-23 LAB — CULTURE, BLOOD (ROUTINE X 2)
Culture: NO GROWTH
Culture: NO GROWTH

## 2014-01-23 MED ORDER — POTASSIUM CHLORIDE ER 10 MEQ PO TBCR: 10.0000 meq | EXTENDED_RELEASE_TABLET | Freq: Every day | ORAL | Status: DC

## 2014-01-23 MED ORDER — LEVOFLOXACIN 750 MG PO TABS: 750.0000 mg | ORAL_TABLET | Freq: Every day | ORAL | Status: DC

## 2014-01-23 MED ORDER — OXYCODONE HCL 30 MG PO TABS: 30.0000 mg | ORAL_TABLET | ORAL | Status: DC | PRN

## 2014-01-23 MED ORDER — ACETAMINOPHEN 325 MG PO TABS: 650.0000 mg | ORAL_TABLET | ORAL | Status: DC | PRN

## 2014-01-23 MED ORDER — PROMETHAZINE HCL 25 MG/ML IJ SOLN
12.5000 mg | Freq: Once | INTRAMUSCULAR | Status: AC
  Administered 2014-01-23: 12.5 mg via INTRAVENOUS
  Filled 2014-01-23: qty 1

## 2014-01-23 MED ORDER — DSS 100 MG PO CAPS: 100.0000 mg | ORAL_CAPSULE | Freq: Two times a day (BID) | ORAL | Status: DC | PRN

## 2014-01-23 NOTE — Progress Notes (Signed)
Pt continues to refuse CPAP. Will change to RT PRN Protocol.

## 2014-01-23 NOTE — Discharge Instructions (Signed)

## 2014-01-23 NOTE — Progress Notes (Signed)
Oxygen saturation at rest on room air 83%. Oxygen saturation at rest on 4 liters nasal canula 95%.

## 2014-01-23 NOTE — Plan of Care (Signed)
Problem: Phase II Progression Outcomes Goal: Other Phase II Outcomes/Goals Outcome: Completed/Met Date Met:  01/23/14     

## 2014-01-23 NOTE — Discharge Summary (Signed)
Physician Discharge Summary  Blake Lara VHQ:[id number] DOB: 18-Jan-1981 DOA: 01/16/2014  PCP: [name], MD  Admit date: 01/16/2014 Discharge date: 01/23/2014  Recommendations for Outpatient Follow-up:  1. Take Levaquin for 7 days on discharge. 2. Take potassium 10 mEq daily for 5 days. 3. Follow-up with primary care physician per scheduled appointment. Have them recheck potassium level to make sure it is within normal limits. Potassium has been supplemented prior to discharge.  Discharge Diagnoses:  Active Problems:   Fever   Ureteral obstruction   Hydronephrosis   Acute respiratory failure   Aspiration into airway   Pyrexia   Pulmonary infiltrates    Discharge Condition: stable   Diet recommendation: as tolerated   History of present illness:  33 year old male with past medical history of spina bifida and bladder exstrophy s/p multiple urologic procedures admitted 01/16/14 with ureteral stone and hydronephrosis. Patient underwent left PCN 01/16/14 secondary to obstructive hydronephrosis from distal ureteral stone. On 01/17/14, patient developed unresponsiveness shortly after dilaudid given for pain control. PCCM consulted. Patient refused BiPAP because of claustrophobia but his oxygen saturation improved somewhat on [place] oxygen support. He was started on levaquin for treatment of pneumonia.   Assessment/Plan:    Principal Problem: Left hydronephrosis secondary to distal ureteral stone - status post left PCN 01/16/2014   Active Problems Acte respiratory failure with hypoxia / possible aspiration pneumonia / Leukocytosis / pulmonary edema - likely due to pneumonia. Please note CT chest on 01/20/2014 has questionable aspiration or multifocal pneumonia - repeat CXR showed stable bilateral lung opacities concerning for pneumonia - would continue levaquin for now for additional 7 days on discharge  - for possible pulmonary edema, pt on lasix 20 mg IV Q 12 hours. Will  stop on discharge due to soft BP - order placed for home health PT/OT/RN/ respiratory  - has refused BiPAP in SDU Thrombocytopenia - mild thrombocytopenia on admission which normalized. Hypokalemia - due to lasix - potassium supplemented prior to discharge. Patient will take 10 mEq of potassium daily for 5 days on discharge.  DVT Prophylaxis  - SCD's bilaterally while patient is in hospital.  Code Status: Full.  Family Communication: plan of care discussed with the patient   IV access:   Peripheral IV  Procedures and diagnostic studies:   Dg Chest Port 1 View 01/22/2014 Stable bilateral lung opacities are noted, concerning for pneumonia or edema, with probable subsegmental atelectasis in the left lung base.   Dg Chest Port 1 View 01/21/2014 1. No significant change in aeration to the lungs compared with previous exam.    Medical Consultants:   PCCM  [facility] ([facility] primary)  Other Consultants:   Respiratory team  IAnti-Infectives:    Levaquin 01/18/2014 --> for 7 days on discharge    Signed:  Manson Passey, MD  [facility] 01/23/2014, 9:34 AM  Pager #: [phone]    Discharge Exam: Filed Vitals:   01/23/14 0442  BP: 117/67  Pulse: 93  Temp: 98.5 F (36.9 C)  Resp: 18   Filed Vitals:   01/22/14 1339 01/22/14 1445 01/22/14 2159 01/23/14 0442  BP:  101/51 125/55 117/67  Pulse:  85 92 93  Temp:  98.6 F (37 C) 98 F (36.7 C) 98.5 F (36.9 C)  TempSrc:  Oral Oral Oral  Resp:  19 18 18   Height:      Weight:      SpO2: 96% 96% 95% 95%    General: Pt is alert, follows commands appropriately, not  in acute distress Cardiovascular: Regular rate and rhythm, S1/S2 +, no murmurs Respiratory: Clear to auscultation bilaterally, no wheezing, no crackles, no rhonchi Abdominal: Soft, non tender, non distended, bowel sounds +, no guarding Extremities: no edema  Discharge Instructions  Discharge Instructions    Call MD for:   difficulty breathing, headache or visual disturbances    Complete by:  As directed      Call MD for:  persistant dizziness or light-headedness    Complete by:  As directed      Call MD for:  persistant nausea and vomiting    Complete by:  As directed      Call MD for:  severe uncontrolled pain    Complete by:  As directed      Diet - low sodium heart healthy    Complete by:  As directed      Discharge instructions    Complete by:  As directed   1. Take Levaquin for 7 days on discharge. 2. Take potassium 10 mEq daily for 5 days. 3. Follow-up with primary care physician per scheduled appointment. Have them recheck potassium level to make sure it is within normal limits. Potassium has been supplemented prior to discharge.     Increase activity slowly    Complete by:  As directed             Medication List    TAKE these medications        acetaminophen 325 MG tablet  Commonly known as:  TYLENOL  Take 2 tablets (650 mg total) by mouth every 4 (four) hours as needed for fever.     DSS 100 MG Caps  Take 100 mg by mouth 2 (two) times daily as needed for mild constipation.     HYDROmorphone 4 MG tablet  Commonly known as:  DILAUDID  Take 8 mg by mouth every 4 (four) hours as needed for severe pain.     ibuprofen 600 MG tablet  Commonly known as:  ADVIL,MOTRIN  Take 600 mg by mouth every 6 (six) hours as needed for headache.     levofloxacin 750 MG tablet  Commonly known as:  LEVAQUIN  Take 1 tablet (750 mg total) by mouth daily.     oxycodone 30 MG immediate release tablet  Commonly known as:  ROXICODONE  Take 1 tablet (30 mg total) by mouth every 4 (four) hours as needed for pain (every 4-6 hrs as needed).     oxymorphone 20 MG 12 hr tablet  Commonly known as:  OPANA ER  Take 20 mg by mouth every 12 (twelve) hours.     potassium chloride 10 MEQ tablet  Commonly known as:  K-DUR  Take 1 tablet (10 mEq total) by mouth daily.           Follow-up Information     Follow up with [name], MD.   Specialty:  Internal Medicine   Contact information:   [address] [place] [address] [phone]        The results of significant diagnostics from this hospitalization (including imaging, microbiology, ancillary and laboratory) are listed below for reference.    Significant Diagnostic Studies: Ct Abdomen Pelvis Wo Contrast  01/16/2014   CLINICAL DATA:  Obstruction of left kidney from ureteral calculus with elevated white blood cell count and fever. The patient presents for left percutaneous nephrostomy. By ultrasound, the left kidney was difficult to visualize and demonstrated some probable hydronephrosis.  EXAM: CT ABDOMEN AND PELVIS WITHOUT  CONTRAST  TECHNIQUE: Multidetector CT imaging of the abdomen and pelvis was performed following the standard protocol without IV contrast.  COMPARISON:  None.  FINDINGS: There is evidence of moderate left-sided hydronephrosis. A 5 mm calculus lies in the posterior lower pole collecting system. The ureter is dilated and can be followed into the pelvis where a distal ureteral calculus is present measuring approximately 10 mm in maximum diameter on the coronal images. This calculus is located fairly close to the expected skin exit site of urine deep in the midline pelvis. The right kidney shows no evidence of hydronephrosis and there is no evidence of right ureteral calculus.  Additional oblong calcification in the anterior pelvis appears to lie outside of bowel and is not a urinary calculus. This measures approximately 12 mm in greatest diameter. Lower midline ventral hernia is present containing bowel. There is no evidence of incarcerated bowel. Additional more superior left-sided ventral hernia contains fat and a left-sided colostomy.  The liver is very vertically oriented and extends well into the pelvis. The spleen is moderately enlarged. No evidence of bowel obstruction or abnormal fluid collection. No free  intraperitoneal air is identified. No enlarged lymph nodes or abnormal soft tissue masses are seen. The adrenal glands appear unremarkable. The pancreas appears unremarkable. 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 The visualized lung bases show prominent scarring and atelectasis at both bases.  IMPRESSION: Moderate left hydronephrosis secondary to a calculus in the distal ureter located deep in the pelvis. This calculus measures 10 mm in greatest length. There is an additional 5 mm calculus in the posterior lower pole of the left kidney.   Electronically Signed   By: Irish Lack M.D.   On: 01/16/2014 21:27   Ct Chest Wo Contrast  01/20/2014   CLINICAL DATA:  Pulmonary infiltrates R91.8 (ICD-10-CM). Spina bifida. Ileostomy.  EXAM: CT CHEST WITHOUT CONTRAST  TECHNIQUE: Multidetector CT imaging of the chest was performed following the standard protocol without IV contrast.  COMPARISON:  Chest radiograph of earlier today.  No prior CT.  FINDINGS: Lungs/Pleura: Mild motion degradation throughout. Airspace and ground-glass opacities throughout the right lung and patchy areas of the left upper and left lower lobes. Septal thickening within these areas. No evidence of extra alveolar air.  Small right sided pleural effusion.  Heart/Mediastinum: Tortuous thoracic aorta. Mild cardiomegaly. Pulmonary artery enlargement, outflow tracts 3.8 cm. Low right paratracheal node of 1.3 cm on image 21. Hilar regions poorly evaluated without intravenous contrast.  Upper Abdomen: Mild hepatic steatosis. Normal imaged portions of the spleen, stomach, adrenal glands, kidneys.  Bones/Musculoskeletal: Presumably developmental fusion of lower thoracic vertebral bodies. Convex left lumbar spine curvature.  IMPRESSION: 1. Motion degradation. 2. Right greater than left ground-glass and airspace opacities. Given time course of development on 01/18/2014, favor pulmonary edema or aspiration. Multifocal  infection could look similar. 3. Pulmonary artery enlargement suggests pulmonary arterial hypertension. 4. Small right pleural effusion. 5. Hepatic steatosis. 6. Mild thoracic adenopathy, likely reactive.   Electronically Signed   By: Jeronimo Greaves M.D.   On: 01/20/2014 14:55   Ir Perc Nephrostomy Left  01/17/2014   CLINICAL DATA:  Left hydronephrosis secondary to distal ureteral catheter. Urinary infection with elevated white blood cell count and fever. History of bladder extrophy.  EXAM: 1. ULTRASOUND GUIDANCE FOR PUNCTURE OF THE LEFT RENAL COLLECTING SYSTEM. 2. LEFT PERCUTANEOUS NEPHROSTOMY TUBE PLACEMENT.  COMPARISON:  CT OF THE ABDOMEN AND PELVIS EARLIER TODAY.  ANESTHESIA/SEDATION: 5.0 mg IV Versed; 100 mcg  IV Fentanyl.  Total Moderate Sedation Time  30 minutes  CONTRAST:  10 ml Omnipaque 300  MEDICATIONS: 400 mg IV Cipro. Ciprofloxacin was given within two hours of incision.  FLUOROSCOPY TIME:  2 minutes and 36 seconds.  PROCEDURE: The procedure, risks, benefits, and alternatives were explained to the patient. Questions regarding the procedure were encouraged and answered. The patient understands and consents to the procedure. A time-out procedure was performed.  The left flank region was prepped with Betadine in a sterile fashion, and a sterile drape was applied covering the operative field. A sterile gown and sterile gloves were used for the procedure. Local anesthesia was provided with 1% Lidocaine.  Ultrasound was used to localize the left kidney. Under direct ultrasound guidance, a 21 gauge needle was advanced into the renal collecting system. Ultrasound image documentation was performed. Aspiration of urine sample was performed followed by contrast injection. The urine sample was sent for culture analysis.  A transitional dilator was advanced over a guidewire. Percutaneous tract dilatation was then performed over the guidewire. A 10 -French percutaneous nephrostomy tube was then advanced and formed  in the collecting system. Catheter position was confirmed by fluoroscopy after contrast injection.  The catheter was secured at the skin with a Prolene retention suture and Stat-Lock device. A gravity bag was placed.  COMPLICATIONS: None.  FINDINGS: Ultrasound demonstrates moderate hydronephrosis. Urine aspirated was mildly turbid. A 10 French catheter was formed at the level of the renal pelvis. After placement, urine return is initially bloody.  IMPRESSION: Placement of 10 French left-sided percutaneous nephrostomy tube to treat hydronephrosis and urinary infection. The tube will be left to gravity drainage. A urine sample was sent for culture analysis.   Electronically Signed   By: Irish LackGlenn  Yamagata M.D.   On: 01/17/2014 08:10   Ir [place] Guide Bx Asp/drain  01/17/2014   CLINICAL DATA:  Left hydronephrosis secondary to distal ureteral catheter. Urinary infection with elevated white blood cell count and fever. History of bladder extrophy.  EXAM: 1. ULTRASOUND GUIDANCE FOR PUNCTURE OF THE LEFT RENAL COLLECTING SYSTEM. 2. LEFT PERCUTANEOUS NEPHROSTOMY TUBE PLACEMENT.  COMPARISON:  CT OF THE ABDOMEN AND PELVIS EARLIER TODAY.  ANESTHESIA/SEDATION: 5.0 mg IV Versed; 100 mcg IV Fentanyl.  Total Moderate Sedation Time  30 minutes  CONTRAST:  10 ml Omnipaque 300  MEDICATIONS: 400 mg IV Cipro. Ciprofloxacin was given within two hours of incision.  FLUOROSCOPY TIME:  2 minutes and 36 seconds.  PROCEDURE: The procedure, risks, benefits, and alternatives were explained to the patient. Questions regarding the procedure were encouraged and answered. The patient understands and consents to the procedure. A time-out procedure was performed.  The left flank region was prepped with Betadine in a sterile fashion, and a sterile drape was applied covering the operative field. A sterile gown and sterile gloves were used for the procedure. Local anesthesia was provided with 1% Lidocaine.  Ultrasound was used to localize the left kidney.  Under direct ultrasound guidance, a 21 gauge needle was advanced into the renal collecting system. Ultrasound image documentation was performed. Aspiration of urine sample was performed followed by contrast injection. The urine sample was sent for culture analysis.  A transitional dilator was advanced over a guidewire. Percutaneous tract dilatation was then performed over the guidewire. A 10 -French percutaneous nephrostomy tube was then advanced and formed in the collecting system. Catheter position was confirmed by fluoroscopy after contrast injection.  The catheter was secured at the skin with a Prolene retention suture and  Stat-Lock device. A gravity bag was placed.  COMPLICATIONS: None.  FINDINGS: Ultrasound demonstrates moderate hydronephrosis. Urine aspirated was mildly turbid. A 10 French catheter was formed at the level of the renal pelvis. After placement, urine return is initially bloody.  IMPRESSION: Placement of 10 French left-sided percutaneous nephrostomy tube to treat hydronephrosis and urinary infection. The tube will be left to gravity drainage. A urine sample was sent for culture analysis.   Electronically Signed   By: Irish LackGlenn  Yamagata M.D.   On: 01/17/2014 08:10   Dg Chest Port 1 View  01/22/2014   CLINICAL DATA:  Acute respiratory failure.  EXAM: PORTABLE CHEST - 1 VIEW  COMPARISON:  January 21, 2014.  FINDINGS: Cardiomediastinal silhouette appears normal. No pneumothorax or significant pleural effusion is noted. Right-sided PICC line is noted with distal tip in the expected position of the cavoatrial junction. Stable linear and airspace opacities are noted in the left lung compared to prior exam, with stable interstitial and airspace opacity seen more diffusely in the right lung. Mild dextroscoliosis of upper thoracic spine is again noted.  IMPRESSION: Stable bilateral lung opacities are noted, concerning for pneumonia or edema, with probable subsegmental atelectasis in the left lung base.    Electronically Signed   By: Roque LiasJames  Green M.D.   On: 01/22/2014 08:15   Dg Chest Port 1 View  01/21/2014   CLINICAL DATA:  Evaluate pulmonary infiltrates  EXAM: PORTABLE CHEST - 1 VIEW  COMPARISON:  01/18/2014  FINDINGS: Right arm PICC line tip is in the cavoatrial junction. Heart size is normal. There are low lung volumes. Bilateral areas of airspace consolidation and atelectasis appear unchanged from previous exam.  IMPRESSION: 1. No significant change in aeration to the lungs compared with previous exam.   Electronically Signed   By: Signa Kellaylor  Stroud M.D.   On: 01/21/2014 07:40   Dg Chest Port 1 View  01/20/2014   CLINICAL DATA:  Aspiration.  EXAM: PORTABLE CHEST - 1 VIEW  COMPARISON:  01/19/2014, 01/18/2014, 01/17/2014  FINDINGS: Low lung volumes. Patient slightly rotated to the right. Stable cardiomediastinal contours. Extensive, confluent airspace disease throughout the right lung is unchanged compared to the chest radiograph dated 01/19/2014. Patchy airspace opacities with peribronchial thickening throughout the left lung are also unchanged.  IMPRESSION: Bilateral airspace disease, right greater than left, is without significant change compared to 01/19/2014.   Electronically Signed   By: Britta MccreedySusan  Turner M.D.   On: 01/20/2014 07:56   Dg Chest Port 1 View  01/19/2014   CLINICAL DATA:  Pneumonia.  EXAM: PORTABLE CHEST - 1 VIEW  COMPARISON:  One-view chest 01/18/2014.  FINDINGS: Right greater than left airspace disease is not significantly changed. The patient is slightly rotated to the right. Mild generalized edema is present as well. Small effusions are suspected. The lung volumes are low.  IMPRESSION: 1. Stable appearance of patchy airspace disease, worse on the right, suggesting pneumonia or alveolar edema. 2. Stable interstitial pattern compatible with edema. 3. Persistent low lung volumes.   Electronically Signed   By: Gennette Pachris  Mattern M.D.   On: 01/19/2014 07:11   Dg Chest Port 1  View  01/18/2014   CLINICAL DATA:  Respiratory failure neck  EXAM: PORTABLE CHEST - 1 VIEW  COMPARISON:  Portable chest x-ray of January 17, 2014  FINDINGS: The lungs are hypoinflated. Confluent alveolar opacities have developed throughout the right lung and with in the left perihilar region. The interstitial markings are prominent diffusely. The cardiac silhouette is indistinct. There  is no pleural effusion or pneumothorax. There is chronic curvature of the mid thoracic spine and S-shaped configuration.  IMPRESSION: Significant change in the appearance of the chest since yesterday with findings consistent with bilateral alveolar pneumonia or other alveolar filling processes including pulmonary edema.   Electronically Signed   By: David  [place]   On: 01/18/2014 07:41   Dg Chest Port 1 View  01/17/2014   CLINICAL DATA:  Difficulty breathing  EXAM: PORTABLE CHEST - 1 VIEW  COMPARISON:  None.  FINDINGS: There is consolidation in the left mid lung. There is patchy atelectasis in the right mid lung and both base regions. Heart size and pulmonary vascularity are within normal limits. No adenopathy. No bone lesions apparent.  IMPRESSION: Areas of patchy atelectasis bilaterally. Left mid lung consolidation.   Electronically Signed   By: Bretta Bang M.D.   On: 01/17/2014 07:58    Microbiology: Recent Results (from the past 240 hour(s))  Culture, blood (routine x 2)     Status: None   Collection Time: 01/16/14 12:16 AM  Result Value Ref Range Status   Specimen Description BLOOD LEFT HAND  Final   Special Requests BOTTLES DRAWN AEROBIC ONLY  Final   Culture  Setup Time   Final   Culture   Final    NO GROWTH 5 DAYS    Report Status 01/23/2014 FINAL  Final  Urine culture     Status: None   Collection Time: 01/16/14  9:11 PM  Result Value Ref Range Status   Specimen Description   Final    URINE, CATHETERIZED LEFT RENAL PELVIS AFTER DIRECT STICK   Special Requests Normal  Final   Culture   Setup Time   Final    01/17/2014 01:27 Performed at [facility]    Colony Count NO GROWTH Performed at [facility]   Final   Report Status 01/17/2014 FINAL  Final  Culture, blood (routine x 2)     Status: None   Collection Time: 01/17/14 12:10 AM  Result Value Ref Range Status   Specimen Description BLOOD RIGHT FOREARM  Final   Special Requests BOTTLES DRAWN AEROBIC ONLY  Final   Culture  Setup Time   Final   Culture   Final    NO GROWTH 5 DAYS    Report Status 01/23/2014 FINAL  Final  MRSA PCR Screening     Status: None   Collection Time: 01/17/14  8:14 AM  Result Value Ref Range Status   MRSA by PCR NEGATIVE NEGATIVE Final     Labs: Basic Metabolic Panel:  Recent Labs Lab 01/18/14 0340 01/19/14 0341 01/20/14 0348 01/20/14 1205 01/21/14 0410 01/22/14 0505  NA 138 140 136* 134*  --  136*  K 3.2* 3.3* 3.7 3.4* 3.7 3.2*  CL 97 99 96 93*  --  92*  CO2 31 30 27 29   --  29  GLUCOSE 122* 102* 105* 153*  --  121*  BUN 20 11 8 8   --  15  CREATININE 1.52* 1.08 0.98 0.96  --  1.07  CALCIUM 8.2* 8.3* 8.4 8.7  --  8.7  MG  --   --   --   --  1.6 1.9  PHOS  --   --   --   --  3.7  --    Liver Function Tests:  Recent Labs Lab 01/20/14 0348  AST 39*  ALT 35  ALKPHOS 121*  BILITOT 3.0*  PROT 6.7  ALBUMIN 2.3*   No results for input(s): LIPASE, AMYLASE in the last 168 hours. No results for input(s): AMMONIA in the last 168 hours. CBC:  Recent Labs Lab 01/18/14 0340 01/19/14 0341 01/20/14 0348 01/22/14 0505  WBC 8.9 10.5 11.5* 14.6*  NEUTROABS  --   --  6.9  --   HGB 11.9* 11.8* 12.9* 13.0  HCT 36.0* 35.8* 38.2* 39.1  MCV 86.3 86.9 85.5 85.0  PLT 111* 108* 111* 196   Cardiac Enzymes: No results for input(s): CKTOTAL, CKMB, CKMBINDEX, TROPONINI in the last 168 hours. BNP: BNP (last 3 results)  Recent Labs  01/19/14 0340  PROBNP 7846.0*   CBG:  Recent Labs Lab 01/19/14 2156 01/20/14 0900 01/20/14 1146 01/20/14 1641  01/20/14 2015  GLUCAP 105* 135* 156* 162* 124*    Time coordinating discharge: Over 30 minutes

## 2014-02-11 ENCOUNTER — Emergency Department (HOSPITAL_COMMUNITY)
Admission: EM | Admit: 2014-02-11 | Discharge: 2014-02-11 | Disposition: A | Discharge status: Home / Self Care | Payer: Medicaid Other | Attending: Emergency Medicine | Admitting: Emergency Medicine

## 2014-02-11 ENCOUNTER — Encounter (HOSPITAL_COMMUNITY): Payer: Self-pay | Admitting: Emergency Medicine

## 2014-02-11 ENCOUNTER — Other Ambulatory Visit (HOSPITAL_COMMUNITY): Payer: Self-pay | Admitting: Interventional Radiology

## 2014-02-11 ENCOUNTER — Other Ambulatory Visit (HOSPITAL_COMMUNITY): Payer: Medicaid Other

## 2014-02-11 DIAGNOSIS — Y846 Urinary catheterization as the cause of abnormal reaction of the patient, or of later complication, without mention of misadventure at the time of the procedure: Secondary | ICD-10-CM | POA: Insufficient documentation

## 2014-02-11 DIAGNOSIS — Q6410 Exstrophy of urinary bladder, unspecified: Secondary | ICD-10-CM | POA: Insufficient documentation

## 2014-02-11 DIAGNOSIS — Q059 Spina bifida, unspecified: Secondary | ICD-10-CM | POA: Diagnosis not present

## 2014-02-11 DIAGNOSIS — Z720 Tobacco use: Secondary | ICD-10-CM | POA: Diagnosis not present

## 2014-02-11 DIAGNOSIS — T83018A Breakdown (mechanical) of other indwelling urethral catheter, initial encounter: Secondary | ICD-10-CM | POA: Diagnosis present

## 2014-02-11 DIAGNOSIS — N1330 Unspecified hydronephrosis: Secondary | ICD-10-CM

## 2014-02-11 NOTE — ED Provider Notes (Signed)
Patient is a 33 year old male who presents emergency room for evaluation of broken bag from his nephrostomy. Bag was replaced here by nursing staff. Patient left prior to being seen by this provider. Patient discharged from the system AGAINST MEDICAL ADVICE.  Eben Burowourtney A Forcucci, PA-C 02/11/14 1305  Rolan BuccoMelanie Belfi, MD 02/11/14 [phone]

## 2014-02-11 NOTE — ED Notes (Signed)
Pt left without d/c paperwork after having foley bag replaced.

## 2014-02-11 NOTE — ED Notes (Signed)
Per pt, states he busted his foley bag in the car door-came here to get it replaced

## 2014-02-12 ENCOUNTER — Encounter: Payer: Self-pay | Admitting: Internal Medicine

## 2014-02-12 ENCOUNTER — Ambulatory Visit (INDEPENDENT_AMBULATORY_CARE_PROVIDER_SITE_OTHER): Payer: Medicaid Other | Admitting: Internal Medicine

## 2014-02-12 VITALS — BP 108/78 | HR 90 | Ht 59.0 in | Wt 235.0 lb

## 2014-02-12 DIAGNOSIS — Z720 Tobacco use: Secondary | ICD-10-CM | POA: Diagnosis not present

## 2014-02-12 DIAGNOSIS — R918 Other nonspecific abnormal finding of lung field: Secondary | ICD-10-CM

## 2014-02-12 DIAGNOSIS — F17210 Nicotine dependence, cigarettes, uncomplicated: Secondary | ICD-10-CM

## 2014-02-12 DIAGNOSIS — Z23 Encounter for immunization: Secondary | ICD-10-CM | POA: Diagnosis not present

## 2014-02-12 MED ORDER — FAMOTIDINE 20 MG PO TABS: ORAL_TABLET | ORAL | Status: DC

## 2014-02-12 MED ORDER — PANTOPRAZOLE SODIUM 40 MG PO TBEC: 40.0000 mg | DELAYED_RELEASE_TABLET | Freq: Every day | ORAL | Status: DC

## 2014-02-12 NOTE — Progress Notes (Signed)
Subjective:     Patient ID: Blake Lara, male   DOB: 1980/10/21    MRN: [id number]  HPI   [age] yowm resumed smoking after completed 17 years of incarceration in July 2015 requested to see for preop op clearance by Dr Laverle PatterBorden p pna last admit:   Admit date: 01/16/2014 Discharge date: 01/23/2014  Recommendations for Outpatient Follow-up:  1. Take Levaquin for 7 days on discharge. 2. Take potassium 10 mEq daily for 5 days. 3. Follow-up with primary care physician per scheduled appointment. Have them recheck potassium level to make sure it is within normal limits. Potassium has been supplemented prior to discharge.  Discharge Diagnoses:  Active Problems:  Fever  Ureteral obstruction  Hydronephrosis  Acute respiratory failure  Aspiration into airway  Pyrexia  Pulmonary infiltrates    02/12/2014 [facility] office visit/ Wert   Chief Complaint  Patient presents with  . Advice Only    Patient here for surgical clearance, he has history of pneumonia under anesthsia. Patient does have some cough, no wheezing, chest tightness. He has sob with exhertion.  completely recovered from pna in 2009 p surgery then started back with coughing after L nephrostomy and dx with asp pna as above.  Presently though feeling fine, no resting sob/ no cough, fine lying flat. Not able to stand on his own or get out of w/c due to Spina bifida  No obvious day to day or daytime variabilty or assoc chronic cough or cp or chest tightness, subjective wheeze overt sinus or hb symptoms. No unusual exp hx or h/o childhood pna/ asthma or knowledge of premature birth.  Sleeping ok without nocturnal  or early am exacerbation  of respiratory  c/o's or need for noct saba. Also denies any obvious fluctuation of symptoms with weather or environmental changes or other aggravating or alleviating factors except as outlined above   Current Medications, Allergies, Complete Past Medical History, Past Surgical  History, Family History, and Social History were reviewed in [facility] electronic medical record.  ROS  The following are not active complaints unless bolded sore throat, dysphagia, dental problems, itching, sneezing,  nasal congestion or excess/ purulent secretions, ear ache,   fever, chills, sweats, unintended wt loss, pleuritic or exertional cp, hemoptysis,  orthopnea pnd or leg swelling, presyncope, palpitations, heartburn, abdominal pain, anorexia, nausea, vomiting, diarrhea  or change in bowel or urinary habits, change in stools or urine, dysuria,hematuria,  rash, arthralgias, visual complaints, headache, numbness weakness or ataxia or problems with walking or coordination,  change in mood/affect or memory.          Review of Systems     Objective:   Physical Exam    w/c bound wm nad  Wt Readings from Last 3 Encounters:  02/12/14 235 lb (106.595 kg)  01/17/14 260 lb 5.8 oz (118.1 kg)  01/15/14 227 lb (102.967 kg)    Vital signs reviewed    HEENT: nl dentition, turbinates, and orophanx. Nl external ear canals without cough reflex   NECK :  without JVD/Nodes/TM/ nl carotid upstrokes bilaterally   LUNGS: no acc muscle use, clear to A and P bilaterally without cough on insp or exp maneuvers   CV:  RRR  no s3 or murmur or increase in P2, no edema   ABD:  soft and nontender with nl excursion upright position.  No bruits or organomegaly, bowel sounds nl L nephrostomy tube in position   MS:  warm without deformities, calf tenderness, cyanosis or clubbing  SKIN: warm and dry without lesions    NEURO:  alert, approp, no deficits      pCXR 01/22/14  Stable bilateral lung opacities are noted, concerning for pneumonia or edema, with probable subsegmental atelectasis in the left lung base.  CXR PA and Lateral:   02/12/2014 : Not able to do   Assessment:

## 2014-02-12 NOTE — Patient Instructions (Addendum)
Pantoprazole (protonix) 40 mg   Take 30-60 min before first meal of the day and Pepcid 20 mg one bedtime    No macrodantin/nitrofurantoin  (this is a common antibiotic used for kidney infection but not a good choice for you)  No smoking at all   GERD (REFLUX)  is an extremely common cause of respiratory symptoms just like yours , many times with no obvious heartburn at all.    It can be treated with medication, but also with lifestyle changes including avoidance of late meals, excessive alcohol, smoking cessation, and avoid fatty foods, chocolate, peppermint, colas, red wine, and acidic juices such as orange juice.  NO MINT OR MENTHOL PRODUCTS SO NO COUGH DROPS  USE SUGARLESS CANDY INSTEAD (Jolley ranchers or Stover's or Life Savers) or even ice chips will also do - the key is to swallow to prevent all throat clearing. NO OIL BASED VITAMINS - use powdered substitutes.  Please remember to go to the  x-ray department downstairs for your tests - we will call you with the results when they are available - if not able to do your cxr here we [facility] before your return in 2 weeks   Please schedule a follow up office visit in 2 weeks, sooner if needed

## 2014-02-13 DIAGNOSIS — F17210 Nicotine dependence, cigarettes, uncomplicated: Secondary | ICD-10-CM | POA: Insufficient documentation

## 2014-02-13 NOTE — Assessment & Plan Note (Signed)
Likely this is from aspiration injury and has resolved but based on body habitus and active smoking/ narc requirement post op is at very high risk of recurrent post op complications and so I did not clear him for elective surgery today  rec Pneumovax > refused Stop smoking for 2 weeks preop > he'll think about it Repeat cxr at least an AP prior to clearing for surgery

## 2014-02-13 NOTE — Assessment & Plan Note (Signed)

## 2014-02-15 ENCOUNTER — Other Ambulatory Visit: Payer: Self-pay | Admitting: Urology

## 2014-02-18 ENCOUNTER — Other Ambulatory Visit (HOSPITAL_COMMUNITY): Payer: Self-pay | Admitting: Urology

## 2014-02-18 DIAGNOSIS — N200 Calculus of kidney: Secondary | ICD-10-CM

## 2014-02-18 DIAGNOSIS — N201 Calculus of ureter: Secondary | ICD-10-CM

## 2014-02-25 ENCOUNTER — Ambulatory Visit (HOSPITAL_COMMUNITY)
Admission: RE | Admit: 2014-02-25 | Discharge: 2014-02-25 | Disposition: A | Discharge status: Home / Self Care | Payer: Medicaid Other | Source: Ambulatory Visit | Attending: Internal Medicine | Admitting: Internal Medicine

## 2014-02-25 ENCOUNTER — Other Ambulatory Visit: Payer: Self-pay | Admitting: Internal Medicine

## 2014-02-25 DIAGNOSIS — J189 Pneumonia, unspecified organism: Secondary | ICD-10-CM | POA: Diagnosis present

## 2014-02-25 DIAGNOSIS — J9811 Atelectasis: Secondary | ICD-10-CM | POA: Insufficient documentation

## 2014-02-25 DIAGNOSIS — Z8701 Personal history of pneumonia (recurrent): Secondary | ICD-10-CM | POA: Insufficient documentation

## 2014-02-25 DIAGNOSIS — R918 Other nonspecific abnormal finding of lung field: Secondary | ICD-10-CM

## 2014-02-26 ENCOUNTER — Ambulatory Visit: Payer: Medicaid Other | Admitting: Internal Medicine

## 2014-02-26 ENCOUNTER — Telehealth: Payer: Self-pay | Admitting: Internal Medicine

## 2014-02-26 NOTE — Telephone Encounter (Signed)
I called and informed the pt of his cxr results  Pt verbalized understanding  He is asking if needs to keep his appt with MW this pm  Per MW he can cancel his appt  Pt aware this was cancelled and nothing further needed

## 2014-03-05 ENCOUNTER — Emergency Department (HOSPITAL_COMMUNITY)
Admission: EM | Admit: 2014-03-05 | Discharge: 2014-03-05 | Disposition: A | Discharge status: Home / Self Care | Payer: Medicaid Other | Attending: Emergency Medicine | Admitting: Emergency Medicine

## 2014-03-05 ENCOUNTER — Encounter (HOSPITAL_COMMUNITY): Payer: Self-pay | Admitting: Emergency Medicine

## 2014-03-05 DIAGNOSIS — Z436 Encounter for attention to other artificial openings of urinary tract: Secondary | ICD-10-CM | POA: Diagnosis not present

## 2014-03-05 DIAGNOSIS — Z87798 Personal history of other (corrected) congenital malformations: Secondary | ICD-10-CM | POA: Diagnosis not present

## 2014-03-05 DIAGNOSIS — Z720 Tobacco use: Secondary | ICD-10-CM | POA: Insufficient documentation

## 2014-03-05 DIAGNOSIS — Z87718 Personal history of other specified (corrected) congenital malformations of genitourinary system: Secondary | ICD-10-CM | POA: Diagnosis not present

## 2014-03-05 DIAGNOSIS — Z91040 Latex allergy status: Secondary | ICD-10-CM | POA: Diagnosis not present

## 2014-03-05 DIAGNOSIS — Z79899 Other long term (current) drug therapy: Secondary | ICD-10-CM | POA: Insufficient documentation

## 2014-03-05 NOTE — Discharge Instructions (Signed)
Please follow the directions provided.  Be sure to follow up with Dr. Laverle PatterBorden.  Don't hesitate to return for any new, worsening or concerning symptoms.    SEEK IMMEDIATE MEDICAL CARE IF:  You notice a change in the size or color of the stoma, especially if it becomes very red, purple, black, or pale white.  You have cloudy, bad smelling urine.  You have increased amounts of mucus in the urine.  You have bloody urine.  You have back pain.  You have abdominal pain, nausea, vomiting, or bloating.  There is anything unusual protruding from the urostomy.  You have irritated or red skin around the urostomy.

## 2014-03-05 NOTE — ED Notes (Signed)
Stop cock tube acquired from OR and replaced with a leg bag from the ED supply room.  Positive drainage noted.  Tegaderm bandage applied to nephrostomy insertion site.

## 2014-03-05 NOTE — ED Notes (Signed)
Pt here stating that urostomy tube/bag is leaking. Pt is not having problem with access site. Pt was here a couple of days ago for same.

## 2014-03-05 NOTE — ED Provider Notes (Signed)
CSN: [id number]     Arrival date & time 03/05/14  1745 History  This chart was scribed for non-physician practitioner, Santiago GladHeather Mylin Hirano, PA-C working with Elwin MochaBlair Walden, MD, by Abel PrestoKara Demonbreun, ED Scribe. This patient was seen in room WTR7/WTR7 and the patient's care was started at 7:28 PM.    Chief Complaint  Patient presents with  . urostomy bag problem     The history is provided by the patient. No language interpreter was used.    HPI Comments: Blake Lara is a 33 y.o. male who presents to the Emergency Department complaining of urostomy bag and tube leaking around 12 PM today.  He notes he has felt his pants wet. He states the leak is small as it took some time before he noticed the area.  He states the bag is still draining. Pt sees Dr. Laverle PatterBorden with Urology.  He denies fever, chills, nausea, vomiting, abdominal pain, or flank pain.    Past Medical History  Diagnosis Date  . Spina bifida   . Dislocated hip     hx of  . Bladder extrophy   . Ileostomy present    Past Surgical History  Procedure Laterality Date  . Abdominal surgery    . Colon surgery    . Hip surgery     No family history on file. History  Substance Use Topics  . Smoking status: Current Every Day Smoker -- 0.50 packs/day for .2 years    Types: Cigarettes  . Smokeless tobacco: Never Used  . Alcohol Use: No    Review of Systems  All other systems reviewed and are negative.     Allergies  Ceftriaxone; Eggs or egg-derived products; Latex; and Peanut-containing drug products  Home Medications   Prior to Admission medications   Medication Sig Start Date End Date Taking? Authorizing Provider  acetaminophen (TYLENOL) 325 MG tablet Take 2 tablets (650 mg total) by mouth every 4 (four) hours as needed for fever. 01/23/14   Alison MurrayAlma M Devine, MD  ALPRAZolam Prudy Feeler(XANAX) 1 MG tablet Take 1 mg by mouth every 6 (six) hours as needed. 02/02/14   Historical Provider, MD  docusate sodium 100 MG CAPS Take 100 mg by mouth 2  (two) times daily as needed for mild constipation. 01/23/14   Alison MurrayAlma M Devine, MD  famotidine (PEPCID) 20 MG tablet One at bedtime 02/12/14   Nyoka CowdenMichael B Wert, MD  OPANA ER, CRUSH RESISTANT, 20 MG T12A Take 1 tablet by mouth every 12 (twelve) hours as needed. 01/23/14   Historical Provider, MD  oxycodone (ROXICODONE) 30 MG immediate release tablet Take 1 tablet (30 mg total) by mouth every 4 (four) hours as needed for pain (every 4-6 hrs as needed). 01/23/14   Alison MurrayAlma M Devine, MD  pantoprazole (PROTONIX) 40 MG tablet Take 1 tablet (40 mg total) by mouth daily. Take 30-60 min before first meal of the day 02/12/14   Nyoka CowdenMichael B Wert, MD   BP 129/58 mmHg  Pulse 101  Temp(Src) 98.4 F (36.9 C) (Oral)  Resp 22  SpO2 98% Physical Exam  Constitutional: He is oriented to person, place, and time. He appears well-developed and well-nourished.  Urostomy tube is draining  HENT:  Head: Normocephalic.  Eyes: Conjunctivae are normal.  Neck: Normal range of motion. Neck supple.  Cardiovascular: Normal rate, regular rhythm and normal heart sounds.   Pulmonary/Chest: Effort normal and breath sounds normal.  Genitourinary:  No erythema, warmth, or swelling at the urostomy site.  Urostomy is actively draining.  Urine present in the Urostomy bag.  Musculoskeletal: Normal range of motion.  Neurological: He is alert and oriented to person, place, and time.  Skin: Skin is warm and dry.  Psychiatric: He has a normal mood and affect. His behavior is normal.  Nursing note and vitals reviewed.   ED Course  Procedures (including critical care time) DIAGNOSTIC STUDIES: Oxygen Saturation is 98% on room air, normal by my interpretation.    COORDINATION OF CARE: 7:30 PM Discussed treatment plan with patient at beside, the patient agrees with the plan and has no further questions at this time.   Labs Review Labs Reviewed - No data to display  Imaging Review No results found.   EKG Interpretation None      MDM    Final diagnoses:  None   Patient presents today requesting to have his Urostomy bag changed due to leak.  Urostomy is actively draining.  No signs of infection.  Patient signed out at shift change.  RN is awaiting equipment from the OR.    Santiago GladHeather Manfred Laspina, PA-C 03/11/14 1114  Elwin MochaBlair Walden, MD 03/11/14 [phone]

## 2014-03-06 NOTE — ED Provider Notes (Signed)
8:05 PM: At end of shift, hand-off report received from Santiago GladHeather Laisure, PA-C.  Plan includes replacing urostomy and then dc to follow-up with his urologist.   8:35 PM: Urostomy equipment provided. Discharge include follow-up with Dr. Laverle PatterBorden.  Return precautions provided.   Filed Vitals:   03/05/14 1754 03/05/14 2033  BP: 129/58 120/84  Pulse: 101 89  Temp: 98.4 F (36.9 C) 98.5 F (36.9 C)  TempSrc: Oral Oral  Resp: 22   SpO2: 98% 97%      Harle BattiestElizabeth Jaeliana Lococo, NP 03/06/14 1745  Elwin MochaBlair Walden, MD 03/07/14 [phone]

## 2014-03-18 ENCOUNTER — Telehealth: Payer: Self-pay | Admitting: *Deleted

## 2014-03-18 NOTE — Telephone Encounter (Signed)
Spoke with the pt  He states that Dr Sherene SiresWert already cleared him for surgery  See phone note dated 02/26/14

## 2014-03-18 NOTE — Telephone Encounter (Signed)
-----   Message from Nyoka CowdenMichael B Wert, MD sent at 03/17/2014  2:18 PM EST ----- Needs ov asap to clear for surgery

## 2014-03-18 NOTE — Telephone Encounter (Signed)
Pt returning call can be reached [phone].[name]

## 2014-03-18 NOTE — Telephone Encounter (Signed)
Pt advised. Javante Nilsson, CMA  

## 2014-03-18 NOTE — Telephone Encounter (Signed)
i see that now I will clear for surgery contingent on him trying hard to reduce/ stop smoking between now and then and warn him anesthesiology has the final say because they have to exam him before they put him to sleep.- if he stops smoking it's much more likely they will go ahead and do that final clearance

## 2014-03-18 NOTE — Telephone Encounter (Signed)
ATC home number and it was d/c'ed Called cell and LMTCB

## 2014-03-28 NOTE — Patient Instructions (Signed)
Blake Lara  03/28/2014   Your procedure is scheduled on: 04/04/2014    Report to [facility]  Entr[facility] and follow signs to               Short Stay Center at       0900 AM.  Call this number if you have problems the morning of surgery [phone]   Remember:  Do not eat food or drink liquids :After Midnight.     Take these medicines the morning of surgery with A SIP OF WATER:                                You may not have any metal on your body including hair pins and              piercings  Do not wear jewelry, , lotions, powders or perfumes.           .              Men may shave face and neck.   Do not bring valuables to the hospital. [facility] IS NOT             RESPONSIBLE   FOR VALUABLES.  Contacts, dentures or bridgework may not be worn into surgery.  Leave suitcase in the car. After surgery it may be brought to your room.         Special Instructions: coughing and deep breathing exercises, leg exercises               Please read over the following fact sheets you were given: _____________________________________________________________________             [facility] - Preparing for Surgery Before surgery, you can play an important role.  Because skin is not sterile, your skin needs to be as free of germs as possible.  You can reduce the number of germs on your skin by washing with CHG (chlorahexidine gluconate) soap before surgery.  CHG is an antiseptic cleaner which kills germs and bonds with the skin to continue killing germs even after washing. Please DO NOT use if you have an allergy to CHG or antibacterial soaps.  If your skin becomes reddened/irritated stop using the CHG and inform your nurse when you arrive at Short Stay. Do not shave (including legs and underarms) for at least 48 hours prior to the first CHG shower.  You may shave your face/neck. Please follow these instructions carefully:  1.  Shower with CHG Soap the night  before surgery and the  morning of Surgery.  2.  If you choose to wash your hair, wash your hair first as usual with your  normal  shampoo.  3.  After you shampoo, rinse your hair and body thoroughly to remove the  shampoo.                           4.  Use CHG as you would any other liquid soap.  You can apply chg directly  to the skin and wash                       Gently with a scrungie or clean washcloth.  5.  Apply the CHG Soap to your body ONLY FROM THE NECK DOWN.  Do not use on face/ open                           Wound or open sores. Avoid contact with eyes, ears mouth and genitals (private parts).                       Wash face,  Genitals (private parts) with your normal soap.             6.  Wash thoroughly, paying special attention to the area where your surgery  will be performed.  7.  Thoroughly rinse your body with warm water from the neck down.  8.  DO NOT shower/wash with your normal soap after using and rinsing off  the CHG Soap.                9.  Pat yourself dry with a clean towel.            10.  Wear clean pajamas.            11.  Place clean sheets on your bed the night of your first shower and do not  sleep with pets. Day of Surgery : Do not apply any lotions/deodorants the morning of surgery.  Please wear clean clothes to the hospital/surgery center.  FAILURE TO FOLLOW THESE INSTRUCTIONS MAY RESULT IN THE CANCELLATION OF YOUR SURGERY PATIENT SIGNATURE_________________________________  NURSE SIGNATURE__________________________________  ________________________________________________________________________  WHAT IS A BLOOD TRANSFUSION? Blood Transfusion Information  A transfusion is the replacement of blood or some of its parts. Blood is made up of multiple cells which provide different functions.  Red blood cells carry oxygen and are used for blood loss replacement.  White blood cells fight against infection.  Platelets control bleeding.  Plasma helps clot  blood.  Other blood products are available for specialized needs, such as hemophilia or other clotting disorders. BEFORE THE TRANSFUSION  Who gives blood for transfusions?   Healthy volunteers who are fully evaluated to make sure their blood is safe. This is blood bank blood. Transfusion therapy is the safest it has ever been in the practice of medicine. Before blood is taken from a donor, a complete history is taken to make sure that person has no history of diseases nor engages in risky social behavior (examples are intravenous drug use or sexual activity with multiple partners). The donor's travel history is screened to minimize risk of transmitting infections, such as malaria. The donated blood is tested for signs of infectious diseases, such as HIV and hepatitis. The blood is then tested to be sure it is compatible with you in order to minimize the chance of a transfusion reaction. If you or a relative donates blood, this is often done in anticipation of surgery and is not appropriate for emergency situations. It takes many days to process the donated blood. RISKS AND COMPLICATIONS Although transfusion therapy is very safe and saves many lives, the main dangers of transfusion include:   Getting an infectious disease.  Developing a transfusion reaction. This is an allergic reaction to something in the blood you were given. Every precaution is taken to prevent this. The decision to have a blood transfusion has been considered carefully by your caregiver before blood is given. Blood is not given unless the benefits outweigh the risks. AFTER THE TRANSFUSION  Right after receiving a blood transfusion, you will usually feel much better and more energetic. This is especially  true if your red blood cells have gotten low (anemic). The transfusion raises the level of the red blood cells which carry oxygen, and this usually causes an energy increase.  The nurse administering the transfusion will monitor  you carefully for complications. HOME CARE INSTRUCTIONS  No special instructions are needed after a transfusion. You may find your energy is better. Speak with your caregiver about any limitations on activity for underlying diseases you may have. SEEK MEDICAL CARE IF:   Your condition is not improving after your transfusion.  You develop redness or irritation at the intravenous (IV) site. SEEK IMMEDIATE MEDICAL CARE IF:  Any of the following symptoms occur over the next 12 hours:  Shaking chills.  You have a temperature by mouth above 102 F (38.9 C), not controlled by medicine.  Chest, back, or muscle pain.  People around you feel you are not acting correctly or are confused.  Shortness of breath or difficulty breathing.  Dizziness and fainting.  You get a rash or develop hives.  You have a decrease in urine output.  Your urine turns a dark color or changes to pink, red, or brown. Any of the following symptoms occur over the next 10 days:  You have a temperature by mouth above 102 F (38.9 C), not controlled by medicine.  Shortness of breath.  Weakness after normal activity.  The white part of the eye turns yellow (jaundice).  You have a decrease in the amount of urine or are urinating less often.  Your urine turns a dark color or changes to pink, red, or brown. Document Released: 02/20/2000 Document Revised: 05/17/2011 Document Reviewed: 10/09/2007 [facility] Patient Information 2014 [place].  _______________________________________________________________________

## 2014-03-29 ENCOUNTER — Inpatient Hospital Stay (HOSPITAL_COMMUNITY)
Admission: RE | Admit: 2014-03-29 | Discharge: 2014-03-29 | Disposition: A | Discharge status: Home / Self Care | Payer: Medicaid Other | Source: Ambulatory Visit

## 2014-04-02 NOTE — Patient Instructions (Addendum)
Broadus JohnScott Amparan  04/02/2014   Your procedure is scheduled on  04/04/14   [facility]  [facility] and follow signs to               Short Stay Center at 9:00 AM.   Call this number if you have problems the morning of surgery [phone]   Remember:  Do not eat food or drink liquids :After Midnight.     Take these medicines the morning of surgery with A SIP OF WATER: may take xanax or opana if needed                               You may not have any metal on your body including hair pins and              piercings  Do not wear jewelry, make-up, lotions, powders or perfumes.             Do not wear nail polish.  Do not shave  48 hours prior to surgery.              Men may shave face and neck.   Do not bring valuables to the hospital. [facility] IS NOT             RESPONSIBLE   FOR VALUABLES.  Contacts, dentures or bridgework may not be worn into surgery.  Leave suitcase in the car. After surgery it may be brought to your room.     Patients discharged the day of surgery will not be allowed to drive home.  Name and phone number of your driver:  Special Instructions: N/A              Please read over the following fact sheets you were given: _____________________________________________________________________                                                     [facility] - PREPARING FOR SURGERY  Before surgery, you can play an important role.  Because skin is not sterile, your skin needs to be as free of germs as possible.  You can reduce the number of germs on your skin by washing with CHG (chlorahexidine gluconate) soap before surgery.  CHG is an antiseptic cleaner which kills germs and bonds with the skin to continue killing germs even after washing. Please DO NOT use if you have an allergy to CHG or antibacterial soaps.  If your skin becomes reddened/irritated stop using the CHG and inform your nurse when you arrive at Short Stay. Do not  shave (including legs and underarms) for at least 48 hours prior to the first CHG shower.  You may shave your face. Please follow these instructions carefully:   1.  Shower with CHG Soap the night before surgery and the  morning of Surgery.   2.  If you choose to wash your hair, wash your hair first as usual with your  normal  Shampoo.   3.  After you shampoo, rinse your hair and body thoroughly to remove the  shampoo.  4.  Use CHG as you would any other liquid soap.  You can apply chg directly  to the skin and wash . Gently wash with scrungie or clean wascloth    5.  Apply the CHG Soap to your body ONLY FROM THE NECK DOWN.   Do not use on open                           Wound or open sores. Avoid contact with eyes, ears mouth and genitals (private parts).                        Genitals (private parts) with your normal soap.              6.  Wash thoroughly, paying special attention to the area where your surgery  will be performed.   7.  Thoroughly rinse your body with warm water from the neck down.   8.  DO NOT shower/wash with your normal soap after using and rinsing off  the CHG Soap .                9.  Pat yourself dry with a clean towel.             10.  Wear clean pajamas.             11.  Place clean sheets on your bed the night of your first shower and do not  sleep with pets.  Day of Surgery : Do not apply any lotions/deodorants the morning of surgery.  Please wear clean clothes to the hospital/surgery center.  FAILURE TO FOLLOW THESE INSTRUCTIONS MAY RESULT IN THE CANCELLATION OF YOUR SURGERY    PATIENT SIGNATURE_________________________________  ______________________________________________________________________

## 2014-04-03 ENCOUNTER — Encounter (HOSPITAL_COMMUNITY): Payer: Self-pay

## 2014-04-03 ENCOUNTER — Encounter (HOSPITAL_COMMUNITY)
Admission: RE | Admit: 2014-04-03 | Discharge: 2014-04-03 | Disposition: A | Discharge status: Home / Self Care | Payer: Medicaid Other | Source: Ambulatory Visit | Attending: Urology | Admitting: Urology

## 2014-04-03 ENCOUNTER — Ambulatory Visit (HOSPITAL_COMMUNITY)
Admission: RE | Admit: 2014-04-03 | Discharge: 2014-04-03 | Disposition: A | Discharge status: Home / Self Care | Payer: Medicaid Other | Source: Ambulatory Visit | Attending: Anesthesiology | Admitting: Anesthesiology

## 2014-04-03 ENCOUNTER — Encounter (HOSPITAL_COMMUNITY): Payer: Self-pay | Admitting: Anesthesiology

## 2014-04-03 DIAGNOSIS — Z91040 Latex allergy status: Secondary | ICD-10-CM | POA: Diagnosis not present

## 2014-04-03 DIAGNOSIS — Z936 Other artificial openings of urinary tract status: Secondary | ICD-10-CM | POA: Diagnosis not present

## 2014-04-03 DIAGNOSIS — Z886 Allergy status to analgesic agent status: Secondary | ICD-10-CM | POA: Diagnosis not present

## 2014-04-03 DIAGNOSIS — Z01812 Encounter for preprocedural laboratory examination: Secondary | ICD-10-CM | POA: Insufficient documentation

## 2014-04-03 DIAGNOSIS — F17210 Nicotine dependence, cigarettes, uncomplicated: Secondary | ICD-10-CM | POA: Diagnosis not present

## 2014-04-03 DIAGNOSIS — N201 Calculus of ureter: Secondary | ICD-10-CM | POA: Insufficient documentation

## 2014-04-03 DIAGNOSIS — Z01811 Encounter for preprocedural respiratory examination: Secondary | ICD-10-CM

## 2014-04-03 DIAGNOSIS — Z91010 Allergy to peanuts: Secondary | ICD-10-CM | POA: Diagnosis not present

## 2014-04-03 DIAGNOSIS — R05 Cough: Secondary | ICD-10-CM

## 2014-04-03 DIAGNOSIS — Q6419 Other exstrophy of urinary bladder: Secondary | ICD-10-CM | POA: Diagnosis not present

## 2014-04-03 DIAGNOSIS — J9811 Atelectasis: Secondary | ICD-10-CM

## 2014-04-03 DIAGNOSIS — F419 Anxiety disorder, unspecified: Secondary | ICD-10-CM | POA: Diagnosis not present

## 2014-04-03 DIAGNOSIS — Z91012 Allergy to eggs: Secondary | ICD-10-CM | POA: Diagnosis not present

## 2014-04-03 DIAGNOSIS — R059 Cough, unspecified: Secondary | ICD-10-CM

## 2014-04-03 DIAGNOSIS — Z6841 Body Mass Index (BMI) 40.0 and over, adult: Secondary | ICD-10-CM | POA: Diagnosis not present

## 2014-04-03 DIAGNOSIS — M1990 Unspecified osteoarthritis, unspecified site: Secondary | ICD-10-CM | POA: Diagnosis not present

## 2014-04-03 DIAGNOSIS — N200 Calculus of kidney: Secondary | ICD-10-CM | POA: Diagnosis present

## 2014-04-03 DIAGNOSIS — R0602 Shortness of breath: Secondary | ICD-10-CM | POA: Diagnosis not present

## 2014-04-03 DIAGNOSIS — E669 Obesity, unspecified: Secondary | ICD-10-CM | POA: Diagnosis not present

## 2014-04-03 DIAGNOSIS — N202 Calculus of kidney with calculus of ureter: Secondary | ICD-10-CM | POA: Diagnosis not present

## 2014-04-03 DIAGNOSIS — Q059 Spina bifida, unspecified: Secondary | ICD-10-CM | POA: Diagnosis not present

## 2014-04-03 DIAGNOSIS — G8929 Other chronic pain: Secondary | ICD-10-CM | POA: Diagnosis not present

## 2014-04-03 HISTORY — DX: Anxiety disorder, unspecified: F41.9

## 2014-04-03 HISTORY — DX: Exstrophy of urinary bladder, unspecified: Q64.10

## 2014-04-03 HISTORY — DX: Adverse effect of unspecified anesthetic, initial encounter: T41.45XA

## 2014-04-03 HISTORY — DX: Other complications of anesthesia, initial encounter: T88.59XA

## 2014-04-03 LAB — BASIC METABOLIC PANEL
Anion gap: 8 (ref 5–15)
BUN: 11 mg/dL (ref 6–23)
CO2: 25 mmol/L (ref 19–32)
Calcium: 9.1 mg/dL (ref 8.4–10.5)
Chloride: 106 mmol/L (ref 96–112)
Creatinine, Ser: 0.76 mg/dL (ref 0.50–1.35)
GFR calc Af Amer: >90 mL/min (ref >90)
GFR calc non Af Amer: >90 mL/min (ref >90)
Glucose, Bld: 98 mg/dL (ref 70–99)
Potassium: 4 mmol/L (ref 3.5–5.1)
Sodium: 139 mmol/L (ref 135–145)

## 2014-04-03 LAB — CBC
HEMATOCRIT: 42.4 % (ref 39.0–52.0)
HEMOGLOBIN: 14.1 g/dL (ref 13.0–17.0)
MCH: 29.3 pg (ref 26.0–34.0)
MCHC: 33.3 g/dL (ref 30.0–36.0)
MCV: 88 fL (ref 78.0–100.0)
Platelets: 262 10*3/uL (ref 150–400)
RBC: 4.82 MIL/uL (ref 4.22–5.81)
RDW: 14.1 % (ref 11.5–15.5)
WBC: 10 10*3/uL (ref 4.0–10.5)

## 2014-04-03 LAB — ABO/RH: ABO/RH(D): A POS
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
## 2014-04-03 NOTE — H&P (Signed)
  History of Present Illness Blake Lara is a 34 year old with a complex urologic history including a history of spina bifida and bladder exstrophy. He has received his urologic [facility] at [facility]. He had no urologic followup for 17 years due to being incarcerated. He presented in November 2015 with an obstructing distal left ureteral stone and fever. Due to his anatomic abnormalities, he underwent a left percutaneous nephrostomy tube placement and antibiotic therapy. He also developed pneumonia during his hospitalization with pulmonary compromise. He has seen and been cleared by Dr. Sherene SiresWert to proceed with general anesthesia.    Past Medical History Problems  1. 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 History of dislocation of hip (Z87.39) 4. History of spina bifida (R60.454(Z87.728)  Surgical History Problems  1. History of Abdominal Surgery 2. History of Hip Surgery  Current Meds 1. Dilaudid 4 MG Oral Tablet;  Therapy: (Recorded:11Nov2015) to Recorded 2. Opana ER 20 MG TB12;  Therapy: (Recorded:11Nov2015) to Recorded 3. Roxicet 5-325 MG Oral Tablet;  Therapy: (Recorded:11Nov2015) to Recorded  Allergies Medication  1. Aleve TABS Non-Medication  2. Eggs 3. Latex 4. Peanuts  Family History Problems  1. Family history of Blood clot in vein : Sister 2. Family history of chronic obstructive pulmonary disease (Z82.5) : Mother 3. Family history of diabetes mellitus (Z83.3) : Mother, Sister 4. Family history of hypertension (Z82.49) : Mother, Sister 5. Denied: No pertinent family history : Father  Social History Problems  1. Current every day smoker (F17.200) 2. Married  Review of Systems  Genitourinary: no hematuria.  Gastrointestinal: no nausea and no vomiting.  Constitutional: no fever.  Cardiovascular: no leg swelling.  Respiratory: no shortness of breath and no cough.    Vitals Vital Signs [Data Includes: Last 1 Day]  Recorded:  02Dec2015 01:50PM    Physical Exam Constitutional: Well nourished and well developed . No acute distress.  ENT:. The ears and nose are normal in appearance.  Neck: The appearance of the neck is normal and no neck mass is present.  Pulmonary: No respiratory distress, normal respiratory rhythm and effort and clear bilateral breath sounds.  Cardiovascular: Heart rate and rhythm are normal . No peripheral edema.  Neuro/Psych:. Mood and affect are appropriate.    Results/Data I reviewed his imaging studies from his hospitalization including his nephrostomy tube placement and CT scan. He does have an 8 mm distal left ureteral calculus and also has a small 4-5 mm nonobstructing renal calculus.     Discussion/Summary 1. Left ureteral calculus/left renal calculus: He will undergo optimization of his percutaneous access prior to antegrade ureteroscopy and percutaneous nephrolithotomy for both his distal ureteral calculus and his nonobstructing lower pole renal calculus. I have reviewed the potential risks including but not limited to bleeding, infection, damage or loss of the kidney, need for additional procedures, damage to adjacent structures such as the lung, pancreas, colon, spleen, etc. We have discussed the potential risk of possible ureteral stricture formation or damage. We also discussed the potential risks of undergoing a general anesthetic considering his pulmonary situation. He understands that he will need to be prone during the procedure and will need intubation for general anesthesia. He accepts these risks and does give his informed consent to proceed.

## 2014-04-04 ENCOUNTER — Ambulatory Visit (HOSPITAL_COMMUNITY): Payer: Medicaid Other | Admitting: Anesthesiology

## 2014-04-04 ENCOUNTER — Observation Stay (HOSPITAL_COMMUNITY)
Admission: RE | Admit: 2014-04-04 | Discharge: 2014-04-05 | Disposition: A | Discharge status: Home / Self Care | Payer: Medicaid Other | Source: Ambulatory Visit | Attending: Urology | Admitting: Urology

## 2014-04-04 ENCOUNTER — Ambulatory Visit (HOSPITAL_COMMUNITY): Payer: Medicaid Other

## 2014-04-04 ENCOUNTER — Inpatient Hospital Stay (HOSPITAL_COMMUNITY)
Admission: RE | Admit: 2014-04-04 | Discharge: 2014-04-04 | Disposition: A | Discharge status: Home / Self Care | Payer: Medicaid Other | Source: Ambulatory Visit | Attending: Urology | Admitting: Urology

## 2014-04-04 ENCOUNTER — Encounter (HOSPITAL_COMMUNITY)
Admission: RE | Disposition: A | Discharge status: Home / Self Care | Payer: Self-pay | Source: Ambulatory Visit | Attending: Urology

## 2014-04-04 DIAGNOSIS — Z6841 Body Mass Index (BMI) 40.0 and over, adult: Secondary | ICD-10-CM | POA: Insufficient documentation

## 2014-04-04 DIAGNOSIS — M1990 Unspecified osteoarthritis, unspecified site: Secondary | ICD-10-CM | POA: Insufficient documentation

## 2014-04-04 DIAGNOSIS — N200 Calculus of kidney: Secondary | ICD-10-CM

## 2014-04-04 DIAGNOSIS — E669 Obesity, unspecified: Secondary | ICD-10-CM | POA: Insufficient documentation

## 2014-04-04 DIAGNOSIS — Z91012 Allergy to eggs: Secondary | ICD-10-CM | POA: Insufficient documentation

## 2014-04-04 DIAGNOSIS — N202 Calculus of kidney with calculus of ureter: Principal | ICD-10-CM | POA: Insufficient documentation

## 2014-04-04 DIAGNOSIS — Q059 Spina bifida, unspecified: Secondary | ICD-10-CM | POA: Diagnosis not present

## 2014-04-04 DIAGNOSIS — Z886 Allergy status to analgesic agent status: Secondary | ICD-10-CM | POA: Insufficient documentation

## 2014-04-04 DIAGNOSIS — Q6419 Other exstrophy of urinary bladder: Secondary | ICD-10-CM | POA: Insufficient documentation

## 2014-04-04 DIAGNOSIS — Z91010 Allergy to peanuts: Secondary | ICD-10-CM | POA: Insufficient documentation

## 2014-04-04 DIAGNOSIS — F17210 Nicotine dependence, cigarettes, uncomplicated: Secondary | ICD-10-CM | POA: Insufficient documentation

## 2014-04-04 DIAGNOSIS — R0602 Shortness of breath: Secondary | ICD-10-CM | POA: Insufficient documentation

## 2014-04-04 DIAGNOSIS — Z936 Other artificial openings of urinary tract status: Secondary | ICD-10-CM | POA: Diagnosis not present

## 2014-04-04 DIAGNOSIS — N201 Calculus of ureter: Secondary | ICD-10-CM | POA: Diagnosis present

## 2014-04-04 DIAGNOSIS — Z91040 Latex allergy status: Secondary | ICD-10-CM | POA: Insufficient documentation

## 2014-04-04 DIAGNOSIS — G8929 Other chronic pain: Secondary | ICD-10-CM | POA: Insufficient documentation

## 2014-04-04 DIAGNOSIS — F419 Anxiety disorder, unspecified: Secondary | ICD-10-CM | POA: Insufficient documentation

## 2014-04-04 HISTORY — PX: CYSTOSCOPY WITH URETEROSCOPY AND STENT PLACEMENT: SHX6377

## 2014-04-04 HISTORY — PX: NEPHROLITHOTOMY: SHX5134

## 2014-04-04 LAB — TYPE AND SCREEN
ABO/RH(D): A POS
Antibody Screen: NEGATIVE

## 2014-04-04 LAB — HEMOGLOBIN: Hemoglobin: 13.5 g/dL (ref 13.0–17.0)

## 2014-04-04 SURGERY — NEPHROLITHOTOMY PERCUTANEOUS
Anesthesia: General | Laterality: Left

## 2014-04-04 MED ORDER — LIDOCAINE HCL (CARDIAC) 20 MG/ML IV SOLN
INTRAVENOUS | Status: AC
  Filled 2014-04-04: qty 5

## 2014-04-04 MED ORDER — PROMETHAZINE HCL 25 MG/ML IJ SOLN: 6.2500 mg | INTRAMUSCULAR | Status: DC | PRN

## 2014-04-04 MED ORDER — HYDROMORPHONE HCL 1 MG/ML IJ SOLN: 0.2500 mg | INTRAMUSCULAR | Status: DC | PRN

## 2014-04-04 MED ORDER — DOCUSATE SODIUM 100 MG PO CAPS
100.0000 mg | ORAL_CAPSULE | Freq: Two times a day (BID) | ORAL | Status: DC | PRN
  Filled 2014-04-04: qty 1

## 2014-04-04 MED ORDER — HYDROMORPHONE HCL 1 MG/ML IJ SOLN: 0.5000 mg | INTRAMUSCULAR | Status: DC | PRN

## 2014-04-04 MED ORDER — KETAMINE HCL 10 MG/ML IJ SOLN
INTRAMUSCULAR | Status: DC | PRN
  Administered 2014-04-04: 50 mg via INTRAVENOUS
  Administered 2014-04-04: 10 mg via INTRAVENOUS

## 2014-04-04 MED ORDER — OXYCODONE HCL 5 MG PO TABS
30.0000 mg | ORAL_TABLET | ORAL | Status: DC | PRN
  Administered 2014-04-04 – 2014-04-05 (×4): 30 mg via ORAL
  Filled 2014-04-04 (×4): qty 6

## 2014-04-04 MED ORDER — PROPOFOL 10 MG/ML IV BOLUS
INTRAVENOUS | Status: AC
  Filled 2014-04-04: qty 20

## 2014-04-04 MED ORDER — MIDAZOLAM HCL 2 MG/2ML IJ SOLN
INTRAMUSCULAR | Status: AC
  Filled 2014-04-04: qty 2

## 2014-04-04 MED ORDER — ROCURONIUM BROMIDE 100 MG/10ML IV SOLN
INTRAVENOUS | Status: DC | PRN
  Administered 2014-04-04: 30 mg via INTRAVENOUS

## 2014-04-04 MED ORDER — CEFTRIAXONE SODIUM IN DEXTROSE 20 MG/ML IV SOLN: 1.0000 g | INTRAVENOUS | Status: DC

## 2014-04-04 MED ORDER — PROPOFOL 10 MG/ML IV BOLUS
INTRAVENOUS | Status: DC | PRN
  Administered 2014-04-04: 150 mg via INTRAVENOUS

## 2014-04-04 MED ORDER — LIDOCAINE HCL (CARDIAC) 20 MG/ML IV SOLN
INTRAVENOUS | Status: DC | PRN
  Administered 2014-04-04: 50 mg via INTRAVENOUS

## 2014-04-04 MED ORDER — ROCURONIUM BROMIDE 100 MG/10ML IV SOLN
INTRAVENOUS | Status: AC
  Filled 2014-04-04: qty 1

## 2014-04-04 MED ORDER — PHENYLEPHRINE HCL 10 MG/ML IJ SOLN
INTRAMUSCULAR | Status: DC | PRN
  Administered 2014-04-04: 40 ug via INTRAVENOUS

## 2014-04-04 MED ORDER — LACTATED RINGERS IV SOLN
INTRAVENOUS | Status: DC
  Administered 2014-04-04: 1000 mL via INTRAVENOUS
  Administered 2014-04-04: 13:00:00 via INTRAVENOUS

## 2014-04-04 MED ORDER — GENTAMICIN SULFATE 40 MG/ML IJ SOLN
420.0000 mg | INTRAVENOUS | Status: AC
  Administered 2014-04-04: 420 mg via INTRAVENOUS
  Filled 2014-04-04: qty 10.5

## 2014-04-04 MED ORDER — FENTANYL CITRATE 0.05 MG/ML IJ SOLN
INTRAMUSCULAR | Status: AC
  Filled 2014-04-04: qty 5

## 2014-04-04 MED ORDER — MORPHINE SULFATE ER 30 MG PO TBCR
60.0000 mg | EXTENDED_RELEASE_TABLET | Freq: Two times a day (BID) | ORAL | Status: DC
  Administered 2014-04-04: 60 mg via ORAL
  Filled 2014-04-04: qty 2

## 2014-04-04 MED ORDER — CITRIC ACID-SODIUM CITRATE 334-500 MG/5ML PO SOLN
ORAL | Status: DC | PRN
Start: 2014-04-04 — End: 2014-04-04
  Administered 2014-04-04: 15 mL via ORAL

## 2014-04-04 MED ORDER — ONDANSETRON HCL 4 MG/2ML IJ SOLN
INTRAMUSCULAR | Status: AC
  Filled 2014-04-04: qty 2

## 2014-04-04 MED ORDER — PHENYLEPHRINE 40 MCG/ML (10ML) SYRINGE FOR IV PUSH (FOR BLOOD PRESSURE SUPPORT)
PREFILLED_SYRINGE | INTRAVENOUS | Status: AC
  Filled 2014-04-04: qty 10

## 2014-04-04 MED ORDER — DEXMEDETOMIDINE HCL IN NACL 200 MCG/50ML IV SOLN
0.4000 ug/kg/h | INTRAVENOUS | Status: DC
  Administered 2014-04-04: 0.5 ug/kg/h via INTRAVENOUS
  Filled 2014-04-04: qty 50

## 2014-04-04 MED ORDER — FENTANYL CITRATE 0.05 MG/ML IJ SOLN
INTRAMUSCULAR | Status: DC | PRN
  Administered 2014-04-04: 50 ug via INTRAVENOUS
  Administered 2014-04-04: 100 ug via INTRAVENOUS
  Administered 2014-04-04 (×2): 50 ug via INTRAVENOUS

## 2014-04-04 MED ORDER — IOHEXOL 300 MG/ML  SOLN
INTRAMUSCULAR | Status: DC | PRN
  Administered 2014-04-04: 37 mL

## 2014-04-04 MED ORDER — KETAMINE HCL 10 MG/ML IJ SOLN
INTRAMUSCULAR | Status: AC
  Filled 2014-04-04: qty 1

## 2014-04-04 MED ORDER — LACTATED RINGERS IV SOLN
INTRAVENOUS | Status: DC | PRN
  Administered 2014-04-04: 12:00:00 via INTRAVENOUS

## 2014-04-04 MED ORDER — ALPRAZOLAM 1 MG PO TABS
1.0000 mg | ORAL_TABLET | Freq: Four times a day (QID) | ORAL | Status: DC | PRN
  Administered 2014-04-04 (×2): 1 mg via ORAL
  Filled 2014-04-04 (×2): qty 1

## 2014-04-04 MED ORDER — ACETAMINOPHEN 325 MG PO TABS
650.0000 mg | ORAL_TABLET | Freq: Four times a day (QID) | ORAL | Status: DC
  Administered 2014-04-04 – 2014-04-05 (×2): 650 mg via ORAL
  Filled 2014-04-04 (×5): qty 2

## 2014-04-04 MED ORDER — LACTATED RINGERS IV SOLN
INTRAVENOUS | Status: DC
  Administered 2014-04-04 (×2): via INTRAVENOUS

## 2014-04-04 MED ORDER — DIPHENHYDRAMINE HCL 25 MG PO CAPS
25.0000 mg | ORAL_CAPSULE | Freq: Once | ORAL | Status: AC
  Administered 2014-04-04: 25 mg via ORAL
  Filled 2014-04-04: qty 1

## 2014-04-04 MED ORDER — SUCCINYLCHOLINE CHLORIDE 20 MG/ML IJ SOLN
INTRAMUSCULAR | Status: DC | PRN
  Administered 2014-04-04: 100 mg via INTRAVENOUS

## 2014-04-04 MED ORDER — ONDANSETRON HCL 4 MG/2ML IJ SOLN
INTRAMUSCULAR | Status: DC | PRN
  Administered 2014-04-04: 4 mg via INTRAVENOUS

## 2014-04-04 MED ORDER — ONDANSETRON HCL 4 MG/2ML IJ SOLN
4.0000 mg | INTRAMUSCULAR | Status: DC | PRN
  Administered 2014-04-05 (×2): 4 mg via INTRAVENOUS
  Filled 2014-04-04 (×2): qty 2

## 2014-04-04 MED ORDER — GLYCOPYRROLATE 0.2 MG/ML IJ SOLN
INTRAMUSCULAR | Status: AC
  Filled 2014-04-04: qty 3

## 2014-04-04 MED ORDER — CITRIC ACID-SODIUM CITRATE 334-500 MG/5ML PO SOLN
ORAL | Status: AC
  Filled 2014-04-04: qty 15

## 2014-04-04 MED ORDER — KETOROLAC TROMETHAMINE 15 MG/ML IJ SOLN
15.0000 mg | Freq: Four times a day (QID) | INTRAMUSCULAR | Status: DC
  Administered 2014-04-04 – 2014-04-05 (×3): 15 mg via INTRAVENOUS
  Filled 2014-04-04 (×5): qty 1

## 2014-04-04 MED ORDER — SODIUM CHLORIDE 0.9 % IR SOLN
Status: DC | PRN
  Administered 2014-04-04: 5000 mL

## 2014-04-04 MED ORDER — MIDAZOLAM HCL 5 MG/5ML IJ SOLN
INTRAMUSCULAR | Status: DC | PRN
  Administered 2014-04-04: 2 mg via INTRAVENOUS

## 2014-04-04 MED ORDER — TAMSULOSIN HCL 0.4 MG PO CAPS
0.4000 mg | ORAL_CAPSULE | Freq: Every day | ORAL | Status: DC
  Administered 2014-04-04: 0.4 mg via ORAL
  Filled 2014-04-04 (×2): qty 1

## 2014-04-04 SURGICAL SUPPLY — 49 items
BAG URINE DRAINAGE (UROLOGICAL SUPPLIES) IMPLANT
BASKET STONE NITINOL 3FRX115MB (UROLOGICAL SUPPLIES) IMPLANT
BASKET ZERO TIP NITINOL 2.4FR (BASKET) ×3 IMPLANT
BENZOIN TINCTURE PRP APPL 2/3 (GAUZE/BANDAGES/DRESSINGS) ×6 IMPLANT
CATH BEACON 5.038 65CM KMP-01 (CATHETERS) ×3 IMPLANT
CATH FOLEY 2W COUNCIL 20FR 5CC (CATHETERS) IMPLANT
CATH ROBINSON RED A/P 20FR (CATHETERS) IMPLANT
CATH X-FORCE N30 NEPHROSTOMY (TUBING) ×3 IMPLANT
COVER SURGICAL LIGHT HANDLE (MISCELLANEOUS) ×3 IMPLANT
DRAPE C-ARM 42X120 X-RAY (DRAPES) ×3 IMPLANT
DRAPE CAMERA CLOSED 9X96 (DRAPES) IMPLANT
DRAPE LINGEMAN PERC (DRAPES) ×3 IMPLANT
DRAPE SURG IRRIG POUCH 19X23 (DRAPES) ×3 IMPLANT
DRAPE UTILITY XL STRL (DRAPES) ×3 IMPLANT
DRSG MEPILEX BORDER 4X4 (GAUZE/BANDAGES/DRESSINGS) ×3 IMPLANT
DRSG PAD ABDOMINAL 8X10 ST (GAUZE/BANDAGES/DRESSINGS) ×6 IMPLANT
DRSG TEGADERM 8X12 (GAUZE/BANDAGES/DRESSINGS) ×6 IMPLANT
FIBER LASER FLEXIVA 1000 (UROLOGICAL SUPPLIES) IMPLANT
FIBER LASER FLEXIVA 200 (UROLOGICAL SUPPLIES) IMPLANT
FIBER LASER FLEXIVA 365 (UROLOGICAL SUPPLIES) IMPLANT
FIBER LASER FLEXIVA 550 (UROLOGICAL SUPPLIES) IMPLANT
FIBER LASER TRAC TIP (UROLOGICAL SUPPLIES) ×3 IMPLANT
GAUZE SPONGE 4X4 12PLY STRL (GAUZE/BANDAGES/DRESSINGS) ×3 IMPLANT
GLOVE BIOGEL M STRL SZ7.5 (GLOVE) ×3 IMPLANT
GOWN STRL REUS W/TWL LRG LVL3 (GOWN DISPOSABLE) ×3 IMPLANT
GUIDEWIRE STR DUAL SENSOR (WIRE) ×3 IMPLANT
KIT BASIN OR (CUSTOM PROCEDURE TRAY) ×3 IMPLANT
MANIFOLD NEPTUNE II (INSTRUMENTS) ×3 IMPLANT
NS IRRIG 1000ML POUR BTL (IV SOLUTION) ×3 IMPLANT
PACK BASIC VI WITH GOWN DISP (CUSTOM PROCEDURE TRAY) ×3 IMPLANT
PAD ABD 8X10 STRL (GAUZE/BANDAGES/DRESSINGS) ×3 IMPLANT
PROBE LITHOCLAST ULTRA 3.8X403 (UROLOGICAL SUPPLIES) IMPLANT
PROBE PNEUMATIC 1.0MMX570MM (UROLOGICAL SUPPLIES) IMPLANT
SET IRRIG Y TYPE TUR BLADDER L (SET/KITS/TRAYS/PACK) ×3 IMPLANT
SET WARMING FLUID IRRIGATION (MISCELLANEOUS) IMPLANT
SHEATH ACCESS URETERAL 38CM (SHEATH) ×3 IMPLANT
SHIELD EYE BINOCULAR (MISCELLANEOUS) IMPLANT
SPONGE LAP 4X18 X RAY DECT (DISPOSABLE) ×3 IMPLANT
STENT ENDOURETEROTOMY 7-14 26C (STENTS) IMPLANT
STONE CATCHER W/TUBE ADAPTER (UROLOGICAL SUPPLIES) ×3 IMPLANT
SUT SILK 2 0 30  PSL (SUTURE)
SUT SILK 2 0 30 PSL (SUTURE) IMPLANT
SYR 20CC LL (SYRINGE) ×6 IMPLANT
SYRINGE 10CC LL (SYRINGE) ×3 IMPLANT
TOWEL OR NON WOVEN STRL DISP B (DISPOSABLE) ×3 IMPLANT
TRAY FOLEY CATH 14FRSI W/METER (CATHETERS) IMPLANT
TUBING CONNECTING 10 (TUBING) ×4 IMPLANT
TUBING CONNECTING 10' (TUBING) ×2
WATER STERILE IRR 1500ML POUR (IV SOLUTION) ×3 IMPLANT

## 2014-04-04 NOTE — Anesthesia Procedure Notes (Signed)
Procedure Name: Intubation Date/Time: 04/04/2014 11:46 AM Performed by: Paris LoreBLANTON, Sandrina Heaton M Pre-anesthesia Checklist: Patient identified, Emergency Drugs available, Suction available, Patient being monitored and Timeout performed Patient Re-evaluated:Patient Re-evaluated prior to inductionOxygen Delivery Method: Circle system utilized Preoxygenation: Pre-oxygenation with 100% oxygen Intubation Type: IV induction and Rapid sequence Laryngoscope Size: Mac and 4 Grade View: Grade I Tube type: Oral Tube size: 7.5 mm Number of attempts: 1 Airway Equipment and Method: Video-laryngoscopy Placement Confirmation: ETT inserted through vocal cords under direct vision,  positive ETCO2,  CO2 detector and breath sounds checked- equal and bilateral Secured at: 21 cm Tube secured with: Tape Dental Injury: Teeth and Oropharynx as per pre-operative assessment

## 2014-04-04 NOTE — Op Note (Signed)
Preoperative Diagnosis: Nephrolithiasis  Postoperative Diagnosis:  same  Procedure(s) Performed:  Left antegrade ureteroscopy, Laser lithotripsy, Basket stone extraction, Left retrograde pyelogram and Intraoperative fluoroscopy with interpretation <1 hr, Left ureteral stent placement  Teaching Surgeon:  Heloise PurpuraLester Caid Radin, MD  Resident Surgeon:  Dyke BrackettJames "Jed" Ferguson, MD  Interventional radiologis:  Irish LackGlenn Yamagata, MD  Anesthesia:  General via endotracheal tube  Fluids:  See anesthesia record  Estimated blood loss:  5 mL  Specimens:  Stone for analysis  Cultures:  none  Drains:  5Fr Kumpe catheter capped as externalized nephroureteral stent with distal tip in bladder.  Complications:  none  Indications: 33yM with history of spina bifida and bladder extrophy who empties his bladder into diaper, with recent Left distal ureteral stone and sepsis. On 11/11, he had emergent L PCN placed, and was managed with antibiotics. He returns today for elective L percutaneous antegrade treatment of his stone, given his complex lower urinary tract anatomy.  Findings:   1. L PCN in interpolar position. Nephrostogram showed antegrade passage of contrast down to filling defect in distal ureter, consistent with prior CT findings. 2. L percutaneous tract dilated up to 14Fr with serial dilators, allowing easy antegrade passage of 14Fr ureteral access sheath. 3. Laser lithotripsy easily broke the stone into basketable fragments, all of which were removed. 4. Kumpe in good position at end of case.  Description:  The patient was correctly identified in the preop holding area where written informed consent as well potential risk and complication reviewed. He agreed. They were brought back to the operative suite where a preinduction timeout was performed. Once correct information was verified, general anesthesia was induced via endotracheal tube. He was placed in prone position with pressure points padded. They were  prepped and draped in the usual sterile fashion and given appropriate preoperative antibiotics with gentamicin.   His exisiting nephrostomy tube was cannulated with a sensor wire down to the bladder, overwhich a 5Fr kumpe was placed which allowed an amplatz super stiff to be placed. Serial dilation was performed up to 14Fr, and a 10Fr coaxial cathter was placed which allowed introduction of a second wire (sensor) down to the bladder. Over the amplatz super stiff, a 12/14Fr ureteral access sheath was easily advanced down to the distal ureter, which allowed passage of a flexible digital ureteroscope. The stone was encountered, and broken with holmium laser into basketable fragments. Once cleared, the ureter was cleared visually down to the bladder. The sheath was slowly retracted and all stone debris removed. The CT scan had indicated a non-obstructing renal stone, which we spent about 30 mins trying to access, but unfortunately the accessed calyx was directly next to the only calyx in the kidney which we were not able visualize, in which there was a 5mm opacity, likely corresponding to the stone. We discussed possible second access, or attempting retrograde access, but given the technical difficulty of either approach and possibility of complications, we elected to leave this stone in situ.  Next we discussed the best manner of stenting this patient with complex bladder anatomy, and we ultimately elected to leave a kumpe catheter as an externalized nephroureteral stent. The sheath was removed, the kumpe catheter was placed, and sutured to the skin. The patient was flipped back into supine position, and the kumpe catheter was palpated in the bladder through his wide-open epispadic meatus (and non-existent) sphincter.  The patient was awoken from anesthesia, and taken to the recovery room in good condition.

## 2014-04-04 NOTE — Transfer of Care (Signed)
Immediate Anesthesia Transfer of Care Note  Patient: Blake Lara  Procedure(s) Performed: Procedure(s) (LRB):  LEFT PERCUTANEOUS NEPHROLITHOTOMY  (Left) ANTEGRADE URETEROSCOPY AND STENT PLACEMENT (Left) HOLMIUM LASER APPLICATION (Left)  Patient Location: PACU  Anesthesia Type: General  Level of Consciousness: sedated, patient cooperative and responds to stimulation  Airway & Oxygen Therapy: Patient Spontanous Breathing and Patient connected to face mask oxgen  Post-op Assessment: Report given to PACU RN and Post -op Vital signs reviewed and stable  Post vital signs: Reviewed and stable  Complications: No apparent anesthesia complications

## 2014-04-04 NOTE — Progress Notes (Signed)
Mr. Blake Lara would not let me assess him from the waist down. I documented what I could, but Dr. Laverle PatterBorden said he has an ileostomy, but I was unable to look at it. There is also a place on his sacrum that is starting to open per PACU nurse. I was unable to assess, but I will have a WOC nurse consult.

## 2014-04-04 NOTE — Anesthesia Postprocedure Evaluation (Signed)
  Anesthesia Post-op Note  Patient: Blake Lara  Procedure(s) Performed: Procedure(s):  LEFT PERCUTANEOUS NEPHROLITHOTOMY  (Left) ANTEGRADE URETEROSCOPY AND STENT PLACEMENT (Left) HOLMIUM LASER APPLICATION (Left)  Patient Location: PACU  Anesthesia Type:General  Level of Consciousness: awake and alert   Airway and Oxygen Therapy: Patient Spontanous Breathing  Post-op Pain: none  Post-op Assessment: Post-op Vital signs reviewed  Post-op Vital Signs: Reviewed  Last Vitals:  Filed Vitals:   04/04/14 1515  BP: 91/43  Pulse: 79  Temp: 36.4 C  Resp: 18    Complications: No apparent anesthesia complications

## 2014-04-05 ENCOUNTER — Encounter (HOSPITAL_COMMUNITY): Payer: Self-pay | Admitting: Urology

## 2014-04-05 DIAGNOSIS — N202 Calculus of kidney with calculus of ureter: Secondary | ICD-10-CM | POA: Diagnosis not present

## 2014-04-05 DIAGNOSIS — N201 Calculus of ureter: Secondary | ICD-10-CM | POA: Diagnosis present

## 2014-04-05 LAB — BASIC METABOLIC PANEL
Anion gap: 9 (ref 5–15)
BUN: 11 mg/dL (ref 6–23)
CO2: 25 mmol/L (ref 19–32)
Calcium: 8.1 mg/dL — ABNORMAL LOW (ref 8.4–10.5)
Chloride: 105 mmol/L (ref 96–112)
Creatinine, Ser: 1.12 mg/dL (ref 0.50–1.35)
GFR, EST NON AFRICAN AMERICAN: 84 mL/min — AB (ref >90)
Glucose, Bld: 100 mg/dL — ABNORMAL HIGH (ref 70–99)
Potassium: 4.2 mmol/L (ref 3.5–5.1)
Sodium: 139 mmol/L (ref 135–145)

## 2014-04-05 LAB — HEMOGLOBIN AND HEMATOCRIT, BLOOD
HCT: 38 % — ABNORMAL LOW (ref 39.0–52.0)
Hemoglobin: 12.5 g/dL — ABNORMAL LOW (ref 13.0–17.0)

## 2014-04-05 NOTE — Discharge Summary (Signed)
  Date of admission: 04/04/2014  Date of discharge: 04/05/2014  Admission diagnosis: Nephrolithiasis  Discharge diagnosis: same  Secondary diagnoses: chronic pain, anxiety, spina bifida.  History and Physical: For full details, please see admission history and physical. Briefly, Blake Lara is a 34 y.o. year old patient with history of spina bifida/bladder extrophy who recently had distal ureteral stone and sepsis, improved with L PCN placement. He returns for definitive treatment.   Hospital Course: He underwent uncomplicated percutaneous antegrade ureteroscopy on 1/28. POstop he was managed with externalized kumpe catheter as nephroureteral stent. He did well. This was removed on POD#1. He was discharged home in stable condition.  Laboratory values:  Recent Labs  04/03/14 0915 04/04/14 1744 04/05/14 0526  HGB 14.1 13.5 12.5*  HCT 42.4  --  38.0*    Recent Labs  04/03/14 0915 04/05/14 0526  CREATININE 0.76 1.12    Disposition: Home  Discharge instruction: Call if temp >101.5 or tomato juice colored urine.  Discharge medications:    Medication List    ASK your doctor about these medications        acetaminophen 325 MG tablet  Commonly known as:  TYLENOL  Take 2 tablets (650 mg total) by mouth every 4 (four) hours as needed for fever.     ALPRAZolam 1 MG tablet  Commonly known as:  XANAX  Take 1 mg by mouth 4 (four) times daily as needed for anxiety.     DSS 100 MG Caps  Take 100 mg by mouth 2 (two) times daily as needed for mild constipation.     famotidine 20 MG tablet  Commonly known as:  PEPCID  One at bedtime     ibuprofen 600 MG tablet  Commonly known as:  ADVIL,MOTRIN  Take 1,200 mg by mouth every 6 (six) hours as needed for mild pain or moderate pain.     OPANA ER (CRUSH RESISTANT) 20 MG T12a  Generic drug:  Oxymorphone HCl (Crush Resist)  Take 1 tablet by mouth every 12 (twelve) hours as needed (FOR PAIN).     oxycodone 30 MG immediate release  tablet  Commonly known as:  ROXICODONE  Take 1 tablet (30 mg total) by mouth every 4 (four) hours as needed for pain (every 4-6 hrs as needed).     pantoprazole 40 MG tablet  Commonly known as:  PROTONIX  Take 1 tablet (40 mg total) by mouth daily. Take 30-60 min before first meal of the day        Followup:   As scheduled at [facility]

## 2014-04-05 NOTE — Discharge Instructions (Signed)
Call if fever > 101.  There may be some drainage from left flank area for about 24 hours.  Dressing can come off at that time if no longer draining.

## 2014-04-05 NOTE — Progress Notes (Signed)
Patient ID: Blake Lara [place], male   DOB: [birth year], 34 y.o.   MRN: [id number]  Post-op note  Subjective: The patient is doing well.  Pain well controlled with his regular oral pain medications.  He did have an episode last night where he did become upset and irate when he was not able to receive IV pain medication prior to trying his oral pain medications.  He threatened to leave AMA.  It was explained that we hoped to avoid over medicating him which was a risk considering his prior episode of aspiration pneumonia at his last hospitalization.  He eventually agreed to take his oral pain medication.  This morning he agreed that it was the right thing to avoid IV narcotics and expressed his thanks to me.  Objective: Vital signs in last 24 hours: Temp:  [97.4 F (36.3 C)-98.4 F (36.9 C)] 98.1 F (36.7 C) (01/29 0414) Pulse Rate:  [79-91] 87 (01/29 0414) Resp:  [16-25] 20 (01/29 0414) BP: (91-123)/(43-78) 103/45 mmHg (01/29 0414) SpO2:  [9 %-97 %] 9 % (01/29 0414)  Intake/Output from previous day: 01/28 0701 - 01/29 0700 In: 3941.7 [I.V.:3941.7] Out: -  Intake/Output this shift:    Physical Exam:  General: Alert and oriented. Abdomen: Soft, Nondistended. L flank site with Kumpe catheter in place. No bleeding from site.  Lab Results:  Recent Labs  04/03/14 0915 04/04/14 1744 04/05/14 0526  HGB 14.1 13.5 12.5*  HCT 42.4  --  38.0*    Assessment/Plan: POD#1  - Kumpe catheter removed.  Pt discharged on Bactrim for 3 days per prior culture.  He will follow up in a few weeks as planned to discuss stone analysis.   Rolly SalterLester S. Syenna Nazir, Montez HagemanJr. MD   LOS: 1 day   Zaryia Markel,LES 04/05/2014, 9:35 AM

## 2014-04-05 NOTE — Progress Notes (Signed)
CARE MANAGEMENT NOTE 04/05/2014  Patient:  Blake Lara,Blake Lara   Account Number:  [id number]  Date Initiated:  04/05/2014  Documentation initiated by:  Lanier ClamMAHABIR,Luca Dyar  Subjective/Objective Assessment:   34 y/o m admitted w/Nephrolithiasis.     Action/Plan:   From home.   Anticipated DC Date:  04/05/2014   Anticipated DC Plan:  HOME/SELF CARE      DC Planning Services  CM consult      Choice offered to / List presented to:             Status of service:  Completed, signed off Medicare Important Message given?   (If response is "NO", the following Medicare IM given date fields will be blank) Date Medicare IM given:   Medicare IM given by:   Date Additional Medicare IM given:   Additional Medicare IM given by:    Discharge Disposition:  HOME/SELF CARE  Per UR Regulation:  Reviewed for med. necessity/level of care/duration of stay  If discussed at Long Length of Stay Meetings, dates discussed:    Comments:  04/05/14 Lanier ClamKathy Jaskirat Schwieger RN BSN NCM [phone] D/C home no needs or orders.

## 2014-05-05 ENCOUNTER — Encounter (HOSPITAL_COMMUNITY): Payer: Self-pay | Admitting: Emergency Medicine

## 2014-05-05 ENCOUNTER — Emergency Department (HOSPITAL_COMMUNITY): Payer: Medicaid Other

## 2014-05-05 ENCOUNTER — Emergency Department (HOSPITAL_COMMUNITY)
Admission: EM | Admit: 2014-05-05 | Discharge: 2014-05-05 | Disposition: A | Discharge status: Home / Self Care | Payer: Medicaid Other | Attending: Emergency Medicine | Admitting: Emergency Medicine

## 2014-05-05 DIAGNOSIS — Y9389 Activity, other specified: Secondary | ICD-10-CM | POA: Diagnosis not present

## 2014-05-05 DIAGNOSIS — Z87718 Personal history of other specified (corrected) congenital malformations of genitourinary system: Secondary | ICD-10-CM | POA: Diagnosis not present

## 2014-05-05 DIAGNOSIS — S82891A Other fracture of right lower leg, initial encounter for closed fracture: Secondary | ICD-10-CM

## 2014-05-05 DIAGNOSIS — Z8719 Personal history of other diseases of the digestive system: Secondary | ICD-10-CM | POA: Diagnosis not present

## 2014-05-05 DIAGNOSIS — M1990 Unspecified osteoarthritis, unspecified site: Secondary | ICD-10-CM | POA: Insufficient documentation

## 2014-05-05 DIAGNOSIS — Y92410 Unspecified street and highway as the place of occurrence of the external cause: Secondary | ICD-10-CM | POA: Diagnosis not present

## 2014-05-05 DIAGNOSIS — Q059 Spina bifida, unspecified: Secondary | ICD-10-CM | POA: Insufficient documentation

## 2014-05-05 DIAGNOSIS — Z720 Tobacco use: Secondary | ICD-10-CM | POA: Insufficient documentation

## 2014-05-05 DIAGNOSIS — F419 Anxiety disorder, unspecified: Secondary | ICD-10-CM | POA: Insufficient documentation

## 2014-05-05 DIAGNOSIS — Z79899 Other long term (current) drug therapy: Secondary | ICD-10-CM | POA: Insufficient documentation

## 2014-05-05 DIAGNOSIS — S20219A Contusion of unspecified front wall of thorax, initial encounter: Secondary | ICD-10-CM

## 2014-05-05 DIAGNOSIS — Z932 Ileostomy status: Secondary | ICD-10-CM | POA: Diagnosis not present

## 2014-05-05 DIAGNOSIS — S82841A Displaced bimalleolar fracture of right lower leg, initial encounter for closed fracture: Secondary | ICD-10-CM | POA: Diagnosis not present

## 2014-05-05 DIAGNOSIS — Y998 Other external cause status: Secondary | ICD-10-CM | POA: Diagnosis not present

## 2014-05-05 DIAGNOSIS — Z91040 Latex allergy status: Secondary | ICD-10-CM | POA: Diagnosis not present

## 2014-05-05 DIAGNOSIS — S8992XA Unspecified injury of left lower leg, initial encounter: Secondary | ICD-10-CM | POA: Insufficient documentation

## 2014-05-05 DIAGNOSIS — S99911A Unspecified injury of right ankle, initial encounter: Secondary | ICD-10-CM | POA: Diagnosis present

## 2014-05-05 DIAGNOSIS — T1490XA Injury, unspecified, initial encounter: Secondary | ICD-10-CM

## 2014-05-05 HISTORY — DX: Contusion of unspecified front wall of thorax, initial encounter: S20.219A

## 2014-05-05 HISTORY — DX: Displaced bimalleolar fracture of right lower leg, initial encounter for closed fracture: S82.841A

## 2014-05-05 LAB — CBC WITH DIFFERENTIAL/PLATELET
Basophils Absolute: 0 10*3/uL (ref 0.0–0.1)
Basophils Relative: 0 % (ref 0–1)
EOS ABS: 0.2 10*3/uL (ref 0.0–0.7)
Eosinophils Relative: 1 % (ref 0–5)
HCT: 42.7 % (ref 39.0–52.0)
HEMOGLOBIN: 14.3 g/dL (ref 13.0–17.0)
LYMPHS ABS: 2.7 10*3/uL (ref 0.7–4.0)
LYMPHS PCT: 19 % (ref 12–46)
MCH: 29.1 pg (ref 26.0–34.0)
MCHC: 33.5 g/dL (ref 30.0–36.0)
MCV: 87 fL (ref 78.0–100.0)
MONO ABS: 0.9 10*3/uL (ref 0.1–1.0)
Monocytes Relative: 6 % (ref 3–12)
Neutro Abs: 10.8 10*3/uL — ABNORMAL HIGH (ref 1.7–7.7)
Neutrophils Relative %: 74 % (ref 43–77)
Platelets: 293 10*3/uL (ref 150–400)
RBC: 4.91 MIL/uL (ref 4.22–5.81)
RDW: 13.7 % (ref 11.5–15.5)
WBC: 14.7 10*3/uL — ABNORMAL HIGH (ref 4.0–10.5)

## 2014-05-05 LAB — I-STAT CHEM 8, ED
BUN: 11 mg/dL (ref 6–23)
CREATININE: 0.8 mg/dL (ref 0.50–1.35)
Calcium, Ion: 1.13 mmol/L (ref 1.12–1.23)
Chloride: 105 mmol/L (ref 96–112)
Glucose, Bld: 100 mg/dL — ABNORMAL HIGH (ref 70–99)
HEMATOCRIT: 45 % (ref 39.0–52.0)
Hemoglobin: 15.3 g/dL (ref 13.0–17.0)
Potassium: 3.6 mmol/L (ref 3.5–5.1)
Sodium: 141 mmol/L (ref 135–145)
TCO2: 20 mmol/L (ref 0–100)

## 2014-05-05 MED ORDER — OXYCODONE HCL 30 MG PO TABS: 30.0000 mg | ORAL_TABLET | ORAL | Status: DC | PRN

## 2014-05-05 MED ORDER — HYDROMORPHONE HCL 1 MG/ML IJ SOLN
1.0000 mg | Freq: Once | INTRAMUSCULAR | Status: AC
  Administered 2014-05-05: 1 mg via INTRAVENOUS
  Filled 2014-05-05: qty 1

## 2014-05-05 MED ORDER — IOHEXOL 300 MG/ML  SOLN
100.0000 mL | Freq: Once | INTRAMUSCULAR | Status: AC | PRN
  Administered 2014-05-05: 100 mL via INTRAVENOUS

## 2014-05-05 MED ORDER — HYDROMORPHONE HCL 1 MG/ML IJ SOLN
1.0000 mg | INTRAMUSCULAR | Status: DC | PRN
  Administered 2014-05-05 (×2): 1 mg via INTRAVENOUS
  Filled 2014-05-05 (×2): qty 1
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
## 2014-05-05 NOTE — ED Notes (Signed)
Ortho tech called and made aware of orders 

## 2014-05-05 NOTE — ED Notes (Signed)
MD Knapp at bedside 
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
## 2014-05-10 ENCOUNTER — Other Ambulatory Visit: Payer: Self-pay | Admitting: Orthopedic Surgery

## 2014-05-10 ENCOUNTER — Encounter (HOSPITAL_BASED_OUTPATIENT_CLINIC_OR_DEPARTMENT_OTHER): Payer: Self-pay | Admitting: *Deleted

## 2014-05-10 NOTE — Pre-Procedure Instructions (Addendum)
History discussed with Dr. Ivin Bootyrews; pt. needs to have [facility]; message left for Aldean BakerSusan Folino at Dr. Veda Canninghandler's office.

## 2014-05-13 ENCOUNTER — Encounter (HOSPITAL_BASED_OUTPATIENT_CLINIC_OR_DEPARTMENT_OTHER): Payer: Self-pay | Admitting: *Deleted

## 2014-05-13 ENCOUNTER — Other Ambulatory Visit: Payer: Self-pay | Admitting: Orthopedic Surgery

## 2014-05-13 MED ORDER — CLINDAMYCIN PHOSPHATE 900 MG/50ML IV SOLN
900.0000 mg | INTRAVENOUS | Status: AC
  Administered 2014-05-14: 900 mg via INTRAVENOUS
  Filled 2014-05-13: qty 50

## 2014-05-13 NOTE — Progress Notes (Signed)
Pt requested that the same medications be used for anesthesia tomorrow as [facility]. He states he's always gotten pneumonia after surgeries except for the surgery in January and he states he did great.

## 2014-05-14 ENCOUNTER — Encounter (HOSPITAL_COMMUNITY)
Admission: RE | Disposition: A | Discharge status: Home / Self Care | Payer: Self-pay | Source: Ambulatory Visit | Attending: Orthopedic Surgery

## 2014-05-14 ENCOUNTER — Ambulatory Visit (HOSPITAL_BASED_OUTPATIENT_CLINIC_OR_DEPARTMENT_OTHER)
Admission: RE | Admit: 2014-05-14 | Discharge: 2014-05-14 | Disposition: A | Discharge status: Home / Self Care | Payer: Medicaid Other | Source: Ambulatory Visit | Attending: Orthopedic Surgery | Admitting: Orthopedic Surgery

## 2014-05-14 ENCOUNTER — Ambulatory Visit (HOSPITAL_COMMUNITY): Payer: Medicaid Other | Admitting: Anesthesiology

## 2014-05-14 ENCOUNTER — Encounter (HOSPITAL_COMMUNITY): Payer: Self-pay

## 2014-05-14 DIAGNOSIS — Y92488 Other paved roadways as the place of occurrence of the external cause: Secondary | ICD-10-CM | POA: Diagnosis not present

## 2014-05-14 DIAGNOSIS — K219 Gastro-esophageal reflux disease without esophagitis: Secondary | ICD-10-CM | POA: Diagnosis not present

## 2014-05-14 DIAGNOSIS — Z91040 Latex allergy status: Secondary | ICD-10-CM | POA: Diagnosis not present

## 2014-05-14 DIAGNOSIS — Q059 Spina bifida, unspecified: Secondary | ICD-10-CM | POA: Insufficient documentation

## 2014-05-14 DIAGNOSIS — S82841A Displaced bimalleolar fracture of right lower leg, initial encounter for closed fracture: Secondary | ICD-10-CM | POA: Insufficient documentation

## 2014-05-14 DIAGNOSIS — Z87442 Personal history of urinary calculi: Secondary | ICD-10-CM | POA: Diagnosis not present

## 2014-05-14 DIAGNOSIS — F419 Anxiety disorder, unspecified: Secondary | ICD-10-CM | POA: Diagnosis not present

## 2014-05-14 DIAGNOSIS — F17210 Nicotine dependence, cigarettes, uncomplicated: Secondary | ICD-10-CM | POA: Diagnosis not present

## 2014-05-14 HISTORY — DX: Ileostomy status: Z93.2

## 2014-05-14 HISTORY — DX: Personal history of other diseases of the musculoskeletal system and connective tissue: Z87.39

## 2014-05-14 HISTORY — DX: Other reduced mobility: Z74.09

## 2014-05-14 HISTORY — DX: Dysphagia, unspecified: R13.10

## 2014-05-14 HISTORY — DX: Other general symptoms and signs: R68.89

## 2014-05-14 HISTORY — DX: Gastro-esophageal reflux disease without esophagitis: K21.9

## 2014-05-14 HISTORY — DX: Personal history of other (healed) physical injury and trauma: Z87.828

## 2014-05-14 HISTORY — DX: Personal history of other specified (corrected) congenital malformations of genitourinary system: Z87.718

## 2014-05-14 HISTORY — DX: Personal history of pneumonia (recurrent): Z87.01

## 2014-05-14 HISTORY — DX: Personal history of urinary calculi: Z87.442

## 2014-05-14 HISTORY — DX: Other specified symptoms and signs involving the digestive system and abdomen: R19.8

## 2014-05-14 HISTORY — DX: Contusion of unspecified front wall of thorax, initial encounter: S20.219A

## 2014-05-14 HISTORY — DX: Personal history of other diseases of urinary system: Z87.448

## 2014-05-14 HISTORY — PX: ORIF ANKLE FRACTURE: SHX5408

## 2014-05-14 HISTORY — DX: Displaced bimalleolar fracture of right lower leg, initial encounter for closed fracture: S82.841A

## 2014-05-14 LAB — COMPREHENSIVE METABOLIC PANEL
ALT: 27 U/L (ref 0–53)
AST: 41 U/L — AB (ref 0–37)
Albumin: 2.9 g/dL — ABNORMAL LOW (ref 3.5–5.2)
Alkaline Phosphatase: 106 U/L (ref 39–117)
Anion gap: 6 (ref 5–15)
BUN: 8 mg/dL (ref 6–23)
CALCIUM: 8.5 mg/dL (ref 8.4–10.5)
CO2: 23 mmol/L (ref 19–32)
CREATININE: 0.81 mg/dL (ref 0.50–1.35)
Chloride: 107 mmol/L (ref 96–112)
GFR calc Af Amer: >90 mL/min (ref >90)
Glucose, Bld: 117 mg/dL — ABNORMAL HIGH (ref 70–99)
Potassium: 3.9 mmol/L (ref 3.5–5.1)
Sodium: 136 mmol/L (ref 135–145)
Total Bilirubin: 1.7 mg/dL — ABNORMAL HIGH (ref 0.3–1.2)
Total Protein: 6.4 g/dL (ref 6.0–8.3)

## 2014-05-14 LAB — SURGICAL PCR SCREEN
MRSA, PCR: NEGATIVE
STAPHYLOCOCCUS AUREUS: NEGATIVE

## 2014-05-14 SURGERY — OPEN REDUCTION INTERNAL FIXATION (ORIF) ANKLE FRACTURE
Anesthesia: Choice | Site: Ankle | Laterality: Right

## 2014-05-14 SURGERY — OPEN REDUCTION INTERNAL FIXATION (ORIF) ANKLE FRACTURE
Anesthesia: General | Laterality: Right

## 2014-05-14 MED ORDER — HYDROMORPHONE HCL 1 MG/ML IJ SOLN: 0.2500 mg | INTRAMUSCULAR | Status: DC | PRN

## 2014-05-14 MED ORDER — LIDOCAINE HCL (CARDIAC) 20 MG/ML IV SOLN
INTRAVENOUS | Status: AC
  Filled 2014-05-14: qty 10

## 2014-05-14 MED ORDER — GLYCOPYRROLATE 0.2 MG/ML IJ SOLN
INTRAMUSCULAR | Status: DC | PRN
  Administered 2014-05-14: 0.3 mg via INTRAVENOUS

## 2014-05-14 MED ORDER — FENTANYL CITRATE 0.05 MG/ML IJ SOLN
INTRAMUSCULAR | Status: DC | PRN
  Administered 2014-05-14: 50 ug via INTRAVENOUS
  Administered 2014-05-14 (×2): 100 ug via INTRAVENOUS
  Administered 2014-05-14 (×3): 50 ug via INTRAVENOUS
  Administered 2014-05-14: 100 ug via INTRAVENOUS

## 2014-05-14 MED ORDER — ONDANSETRON HCL 4 MG/2ML IJ SOLN
INTRAMUSCULAR | Status: DC | PRN
  Administered 2014-05-14 (×2): 4 mg via INTRAVENOUS

## 2014-05-14 MED ORDER — KETAMINE HCL 10 MG/ML IJ SOLN
INTRAMUSCULAR | Status: DC | PRN
  Administered 2014-05-14: 30 mg via INTRAVENOUS
  Administered 2014-05-14: 50 mg via INTRAVENOUS

## 2014-05-14 MED ORDER — LIDOCAINE HCL (CARDIAC) 20 MG/ML IV SOLN
INTRAVENOUS | Status: DC | PRN
  Administered 2014-05-14: 100 mg via INTRAVENOUS
  Administered 2014-05-14: 50 mg via INTRAVENOUS

## 2014-05-14 MED ORDER — ARTIFICIAL TEARS OP OINT
TOPICAL_OINTMENT | OPHTHALMIC | Status: DC | PRN
  Administered 2014-05-14: 1 via OPHTHALMIC

## 2014-05-14 MED ORDER — MIDAZOLAM HCL 5 MG/5ML IJ SOLN
INTRAMUSCULAR | Status: DC | PRN
  Administered 2014-05-14 (×2): 2 mg via INTRAVENOUS

## 2014-05-14 MED ORDER — LACTATED RINGERS IV SOLN: INTRAVENOUS | Status: DC

## 2014-05-14 MED ORDER — FENTANYL CITRATE 0.05 MG/ML IJ SOLN
INTRAMUSCULAR | Status: AC
  Filled 2014-05-14: qty 5

## 2014-05-14 MED ORDER — POVIDONE-IODINE 7.5 % EX SOLN
Freq: Once | CUTANEOUS | Status: DC
  Filled 2014-05-14: qty 118

## 2014-05-14 MED ORDER — LACTATED RINGERS IV SOLN
INTRAVENOUS | Status: DC | PRN
  Administered 2014-05-14 (×2): via INTRAVENOUS

## 2014-05-14 MED ORDER — MUPIROCIN 2 % EX OINT
TOPICAL_OINTMENT | CUTANEOUS | Status: AC
  Administered 2014-05-14: 1 via TOPICAL
  Filled 2014-05-14: qty 22

## 2014-05-14 MED ORDER — METOCLOPRAMIDE HCL 5 MG/ML IJ SOLN
INTRAMUSCULAR | Status: AC
  Filled 2014-05-14: qty 2

## 2014-05-14 MED ORDER — PROPOFOL 10 MG/ML IV BOLUS
INTRAVENOUS | Status: DC | PRN
  Administered 2014-05-14: 50 mg via INTRAVENOUS
  Administered 2014-05-14: 200 mg via INTRAVENOUS

## 2014-05-14 MED ORDER — MUPIROCIN 2 % EX OINT
1.0000 "application " | TOPICAL_OINTMENT | Freq: Once | CUTANEOUS | Status: AC
  Administered 2014-05-14: 1 via TOPICAL

## 2014-05-14 MED ORDER — DEXAMETHASONE SODIUM PHOSPHATE 10 MG/ML IJ SOLN
INTRAMUSCULAR | Status: AC
  Filled 2014-05-14: qty 1

## 2014-05-14 MED ORDER — VECURONIUM BROMIDE 10 MG IV SOLR
INTRAVENOUS | Status: DC | PRN
  Administered 2014-05-14: 4 mg via INTRAVENOUS

## 2014-05-14 MED ORDER — SUCCINYLCHOLINE CHLORIDE 20 MG/ML IJ SOLN
INTRAMUSCULAR | Status: DC | PRN
  Administered 2014-05-14: 140 mg via INTRAVENOUS

## 2014-05-14 MED ORDER — NEOSTIGMINE METHYLSULFATE 10 MG/10ML IV SOLN
INTRAVENOUS | Status: DC | PRN
  Administered 2014-05-14: 2.5 mg via INTRAVENOUS

## 2014-05-14 MED ORDER — MIDAZOLAM HCL 2 MG/2ML IJ SOLN
INTRAMUSCULAR | Status: AC
  Filled 2014-05-14: qty 2

## 2014-05-14 MED ORDER — BUPIVACAINE HCL (PF) 0.25 % IJ SOLN
INTRAMUSCULAR | Status: AC
  Filled 2014-05-14: qty 30

## 2014-05-14 MED ORDER — DEXAMETHASONE SODIUM PHOSPHATE 10 MG/ML IJ SOLN
INTRAMUSCULAR | Status: DC | PRN
  Administered 2014-05-14: 10 mg via INTRAVENOUS

## 2014-05-14 MED ORDER — PROMETHAZINE HCL 25 MG/ML IJ SOLN: 6.2500 mg | INTRAMUSCULAR | Status: DC | PRN

## 2014-05-14 MED ORDER — KETAMINE HCL 100 MG/ML IJ SOLN
INTRAMUSCULAR | Status: AC
  Filled 2014-05-14: qty 1

## 2014-05-14 MED ORDER — METOCLOPRAMIDE HCL 5 MG/ML IJ SOLN
INTRAMUSCULAR | Status: DC | PRN
  Administered 2014-05-14: 10 mg via INTRAVENOUS

## 2014-05-14 MED ORDER — 0.9 % SODIUM CHLORIDE (POUR BTL) OPTIME
TOPICAL | Status: DC | PRN
  Administered 2014-05-14: 1000 mL

## 2014-05-14 MED ORDER — ROPIVACAINE HCL 5 MG/ML IJ SOLN
INTRAMUSCULAR | Status: DC | PRN
  Administered 2014-05-14: 20 mL via PERINEURAL

## 2014-05-14 SURGICAL SUPPLY — 68 items
BANDAGE ELASTIC 4 VELCRO ST LF (GAUZE/BANDAGES/DRESSINGS) ×2 IMPLANT
BANDAGE ELASTIC 6 VELCRO ST LF (GAUZE/BANDAGES/DRESSINGS) ×2 IMPLANT
BANDAGE ESMARK 6X9 LF (GAUZE/BANDAGES/DRESSINGS) ×1 IMPLANT
BIT DRILL 110X2.5XQCK CNCT (BIT) ×1 IMPLANT
BIT DRILL 2.5 (BIT) ×1
BIT DRILL 2.7XCANN QCK CNCT (BIT) ×1 IMPLANT
BIT DRILL CANN 2.7 (BIT) ×1
BIT DRILL Q-C 2.0 DIA 100 (BIT) ×2 IMPLANT
BIT DRL 110X2.5XQCK CNCT (BIT) ×1
BIT DRL 2.7XCANN QCK CNCT (BIT) ×1
BLADE SURG 10 STRL SS (BLADE) ×2 IMPLANT
BLADE SURG ROTATE 9660 (MISCELLANEOUS) IMPLANT
BNDG COHESIVE 4X5 TAN STRL (GAUZE/BANDAGES/DRESSINGS) ×4 IMPLANT
BNDG ESMARK 6X9 LF (GAUZE/BANDAGES/DRESSINGS) ×2
BNDG GAUZE ELAST 4 BULKY (GAUZE/BANDAGES/DRESSINGS) ×4 IMPLANT
COVER MAYO STAND STRL (DRAPES) ×2 IMPLANT
COVER SURGICAL LIGHT HANDLE (MISCELLANEOUS) ×2 IMPLANT
CUFF TOURNIQUET SINGLE 34IN LL (TOURNIQUET CUFF) ×2 IMPLANT
CUFF TOURNIQUET SINGLE 44IN (TOURNIQUET CUFF) IMPLANT
DRAPE OEC MINIVIEW 54X84 (DRAPES) IMPLANT
DRAPE U-SHAPE 47X51 STRL (DRAPES) ×2 IMPLANT
DRSG EMULSION OIL 3X3 NADH (GAUZE/BANDAGES/DRESSINGS) ×4 IMPLANT
DRSG PAD ABDOMINAL 8X10 ST (GAUZE/BANDAGES/DRESSINGS) ×4 IMPLANT
DURAPREP 26ML APPLICATOR (WOUND CARE) ×2 IMPLANT
ELECT REM PT RETURN 9FT ADLT (ELECTROSURGICAL) ×2
ELECTRODE REM PT RTRN 9FT ADLT (ELECTROSURGICAL) ×1 IMPLANT
GAUZE SPONGE 4X4 12PLY STRL (GAUZE/BANDAGES/DRESSINGS) ×2 IMPLANT
GAUZE XEROFORM 1X8 LF (GAUZE/BANDAGES/DRESSINGS) ×2 IMPLANT
GLOVE BIO SURGEON STRL SZ7 (GLOVE) ×2 IMPLANT
GLOVE BIO SURGEON STRL SZ7.5 (GLOVE) ×2 IMPLANT
GLOVE BIOGEL PI IND STRL 8 (GLOVE) ×1 IMPLANT
GLOVE BIOGEL PI INDICATOR 8 (GLOVE) ×1
GOWN STRL REUS W/ TWL LRG LVL3 (GOWN DISPOSABLE) ×3 IMPLANT
GOWN STRL REUS W/ TWL XL LVL3 (GOWN DISPOSABLE) ×1 IMPLANT
GOWN STRL REUS W/TWL LRG LVL3 (GOWN DISPOSABLE) ×3
GOWN STRL REUS W/TWL XL LVL3 (GOWN DISPOSABLE) ×1
GUIDEWIRE PIN ORTH 6X1.6XSMTH (WIRE) ×4 IMPLANT
K-WIRE 1.6 (WIRE) ×4
KIT BASIN OR (CUSTOM PROCEDURE TRAY) ×2 IMPLANT
KIT ROOM TURNOVER OR (KITS) ×2 IMPLANT
MANIFOLD NEPTUNE II (INSTRUMENTS) ×2 IMPLANT
NEEDLE 22X1 1/2 (OR ONLY) (NEEDLE) IMPLANT
NS IRRIG 1000ML POUR BTL (IV SOLUTION) ×2 IMPLANT
PACK ORTHO EXTREMITY (CUSTOM PROCEDURE TRAY) ×2 IMPLANT
PAD ARMBOARD 7.5X6 YLW CONV (MISCELLANEOUS) ×4 IMPLANT
PAD CAST 4YDX4 CTTN HI CHSV (CAST SUPPLIES) ×2 IMPLANT
PADDING CAST COTTON 4X4 STRL (CAST SUPPLIES) ×2
PLATE LOCK 3HOLE 2.7MM (Plate) ×2 IMPLANT
PLATE TUBULAR LOCK 5HOLE (Plate) ×2 IMPLANT
SCREW CANCELLOUS 4.0X18 (Screw) ×4 IMPLANT
SCREW CANN 1/3 THREAD 4.0X40MM (Screw) ×4 IMPLANT
SCREW CORTICAL 2.7X36MM (Screw) ×2 IMPLANT
SCREW CORTICAL 2.7X42MM (Screw) ×2 IMPLANT
SCREW CORTICAL 3.5 16MM (Screw) ×4 IMPLANT
SCREW CORTICAL 3.5X14 (Screw) ×4 IMPLANT
SPONGE GAUZE 4X4 12PLY STER LF (GAUZE/BANDAGES/DRESSINGS) ×2 IMPLANT
SPONGE LAP 4X18 X RAY DECT (DISPOSABLE) ×4 IMPLANT
STAPLER VISISTAT 35W (STAPLE) IMPLANT
SUCTION FRAZIER TIP 10 FR DISP (SUCTIONS) ×2 IMPLANT
SUT ETHILON 3 0 FSL (SUTURE) ×6 IMPLANT
SUT ETHILON 4 0 PS 2 18 (SUTURE) IMPLANT
SUT VIC AB 0 CTB1 27 (SUTURE) IMPLANT
SUT VIC AB 2-0 FS1 27 (SUTURE) IMPLANT
SYR CONTROL 10ML LL (SYRINGE) IMPLANT
TOWEL OR 17X24 6PK STRL BLUE (TOWEL DISPOSABLE) ×2 IMPLANT
TOWEL OR 17X26 10 PK STRL BLUE (TOWEL DISPOSABLE) ×2 IMPLANT
TUBE CONNECTING 12X1/4 (SUCTIONS) ×2 IMPLANT
WATER STERILE IRR 1000ML POUR (IV SOLUTION) ×2 IMPLANT

## 2014-05-14 NOTE — Anesthesia Procedure Notes (Addendum)
Anesthesia Regional Block:  Popliteal block  Pre-Anesthetic Checklist: ,, timeout performed, Correct Patient, Correct Site, Correct Laterality, Correct Procedure, Correct Position, site marked, Risks and benefits discussed,  Surgical consent,  Pre-op evaluation,  At surgeon's request and post-op pain management  Laterality: Right  Prep: chloraprep       Needles:  Injection technique: Single-shot  Needle Type: Echogenic Needle     Needle Length: 9cm 9 cm Needle Gauge: 21 and 21 G    Additional Needles:  Procedures: ultrasound guided (picture in chart) Popliteal block Narrative:  Injection made incrementally with aspirations every 5 mL.  Performed by: Personally   Additional Notes: Patient tolerated the procedure well without complications   Procedure Name: Intubation Date/Time: 05/14/2014 3:12 PM Performed by: Wray KearnsFOLEY, Bensen Chadderdon A Pre-anesthesia Checklist: Patient identified, Timeout performed, Emergency Drugs available, Suction available and Patient being monitored Patient Re-evaluated:Patient Re-evaluated prior to inductionOxygen Delivery Method: Circle system utilized Preoxygenation: Pre-oxygenation with 100% oxygen Intubation Type: IV induction and Cricoid Pressure applied Ventilation: Mask ventilation without difficulty and Oral airway inserted - appropriate to patient size Laryngoscope Size: Mac, 4 and Glidescope Grade View: Grade I Tube type: Oral Tube size: 7.5 mm Number of attempts: 1 Airway Equipment and Method: Rigid stylet and Video-laryngoscopy Placement Confirmation: ETT inserted through vocal cords under direct vision,  breath sounds checked- equal and bilateral and positive ETCO2 Secured at: 23 cm Dental Injury: Teeth and Oropharynx as per pre-operative assessment  Difficulty Due To: Difficulty was anticipated, Difficult Airway- due to reduced neck mobility, Difficult Airway- due to large tongue and Difficult Airway- due to dentition

## 2014-05-14 NOTE — Discharge Instructions (Signed)
Discharge Instructions After Ankle Fracture  - Keep splint on, do not get wet. Keep on until your first visit with Dr. Ave Filterhandler  - Elevate leg as much as possible, wiggle toes - Do not put weight on affected leg - Take one aspirin a day for 2 weeks after surgery, unless you have an aspirin sensitivity/ allergy or asthma.  *Please note that pain medications will not be refilled after hours or on weekends.    Peripheral Nerve Blocks Your caregiver has placed a nerve block in one of your arms or legs to reduce pain and discomfort. The block lessens the amount of pain medicine you will need. Your caregiver will inject you in the arm or leg that was operated on. The injection is usually given away from the surgical site. The injection is a local anesthetic or a combination of local anesthetics. This injection provides numbing pain relief for up to 18 to 24 hours. There are few possible complications from this procedure. However, you should notify your caregiver if you have any problems. Be aware that you may lose feeling at and around the surgical area. If numbness happens, take proper measures to avoid injury. Do not stand up unassisted if you have a nerve block in your leg. Do not try to lift items if you have had a nerve block in your arm. Be careful when placing hot or cold items on a numb extremity. SEEK IMMEDIATE MEDICAL CARE IF: You have redness, swelling, pain, or discharge at the injection site. You develop dizziness or lightheadedness. You have blurred vision. There is a ringing or buzzing in your ears. You have a metal taste in your mouth. You develop numbness or tingling around your mouth. You develop drowsiness. You develop confusion. Document Released: 06/01/2007 Document Revised: 05/17/2011 Document Reviewed: 04/13/2010 [facility] Patient Information 2015 [place]. This information is not intended to replace advice given to you by your health care provider. Make sure you  discuss any questions you have with your health care provider.    General Anesthesia, Care After Refer to this sheet in the next few weeks. These instructions provide you with information on caring for yourself after your procedure. Your health care provider may also give you more specific instructions. Your treatment has been planned according to current medical practices, but problems sometimes occur. Call your health care provider if you have any problems or questions after your procedure. WHAT TO EXPECT AFTER THE PROCEDURE After the procedure, it is typical to experience:  Sleepiness.  Nausea and vomiting. HOME CARE INSTRUCTIONS  For the first 24 hours after general anesthesia:  Have a responsible person with you.  Do not drive a car. If you are alone, do not take public transportation.  Do not drink alcohol.  Do not take medicine that has not been prescribed by your health care provider.  Do not sign important papers or make important decisions.  You may resume a normal diet and activities as directed by your health care provider.  Change bandages (dressings) as directed.  If you have questions or problems that seem related to general anesthesia, call the hospital and ask for the anesthetist or anesthesiologist on call. SEEK MEDICAL CARE IF:  You have nausea and vomiting that continue the day after anesthesia.  You develop a rash. SEEK IMMEDIATE MEDICAL CARE IF:   You have difficulty breathing.  You have chest pain.  You have any allergic problems. Document Released: 05/31/2000 Document Revised: 02/27/2013 Document Reviewed: 09/07/2012 Exit[employer] Patient Information  2015 [employer]. This information is not intended to replace advice given to you by your health care provider. Make sure you discuss any questions you have with your health care provider.

## 2014-05-14 NOTE — Transfer of Care (Signed)
Immediate Anesthesia Transfer of Care Note  Patient: Blake Lara  Procedure(s) Performed: Procedure(s) with comments: OPEN REDUCTION INTERNAL FIXATION (ORIF) BIMALLEOLAR ANKLE FRACTURE (Right) - OPEN REDUCTION INTERNAL FIXATION (ORIF) BIMALLEOLAR RIGHT ANKLE FRACTURE  Patient Location: PACU  Anesthesia Type:GA combined with regional for post-op pain  Level of Consciousness: awake, oriented, sedated, patient cooperative and responds to stimulation  Airway & Oxygen Therapy: Patient Spontanous Breathing and Patient connected to nasal cannula oxygen  Post-op Assessment: Report given to RN, Post -op Vital signs reviewed and stable, Patient moving all extremities and Patient moving all extremities X 4  Post vital signs: Reviewed and stable  Last Vitals:  Filed Vitals:   05/14/14 1241  BP: 115/55  Pulse: 98  Temp: 36.7 C  Resp: 18    Complications: No apparent anesthesia complications

## 2014-05-14 NOTE — Anesthesia Preprocedure Evaluation (Signed)
Anesthesia Evaluation  Patient identified by MRN, date of birth, ID band Patient awake    Reviewed: Allergy & Precautions, NPO status , Patient's Chart, lab work & pertinent test results  Airway Mallampati: III  TM Distance: <3 FB Neck ROM: Full    Dental no notable dental hx. (+) Dental Advisory Given   Pulmonary Current Smoker,  breath sounds clear to auscultation  Pulmonary exam normal       Cardiovascular negative cardio ROS  Rhythm:Regular Rate:Normal     Neuro/Psych Chronic narcotic use negative psych ROS   GI/Hepatic negative GI ROS, Neg liver ROS,   Endo/Other  negative endocrine ROS  Renal/GU negative Renal ROS  negative genitourinary   Musculoskeletal negative musculoskeletal ROS (+)   Abdominal   Peds negative pediatric ROS (+)  Hematology negative hematology ROS (+)   Anesthesia Other Findings   Reproductive/Obstetrics negative OB ROS                             Anesthesia Physical Anesthesia Plan  ASA: III  Anesthesia Plan: General   Post-op Pain Management:    Induction: Intravenous  Airway Management Planned: Oral ETT and Video Laryngoscope Planned  Additional Equipment:   Intra-op Plan:   Post-operative Plan: Extubation in OR  Informed Consent: I have reviewed the patients History and Physical, chart, labs and discussed the procedure including the risks, benefits and alternatives for the proposed anesthesia with the patient or authorized representative who has indicated his/her understanding and acceptance.   Dental advisory given  Plan Discussed with: CRNA and Surgeon  Anesthesia Plan Comments:         Anesthesia Quick Evaluation
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
## 2014-05-15 NOTE — Anesthesia Postprocedure Evaluation (Signed)
  Anesthesia Post-op Note  Patient: Blake Lara  Procedure(s) Performed: Procedure(s) with comments: OPEN REDUCTION INTERNAL FIXATION (ORIF) BIMALLEOLAR ANKLE FRACTURE (Right) - OPEN REDUCTION INTERNAL FIXATION (ORIF) BIMALLEOLAR RIGHT ANKLE FRACTURE  Patient Location: PACU  Anesthesia Type:General  Level of Consciousness: awake, alert , oriented and patient cooperative  Airway and Oxygen Therapy: Patient Spontanous Breathing  Post-op Pain: mild  Post-op Assessment: Post-op Vital signs reviewed, Patient's Cardiovascular Status Stable, Respiratory Function Stable, Patent Airway, No signs of Nausea or vomiting and Pain level controlled  Post-op Vital Signs: stable  Last Vitals:  Filed Vitals:   05/14/14 1825  BP: 136/80  Pulse: 105  Temp: 36.7 C  Resp: 24    Complications: No apparent anesthesia complications

## 2014-05-19 ENCOUNTER — Encounter (HOSPITAL_COMMUNITY): Payer: Self-pay | Admitting: Orthopedic Surgery

## 2014-06-07 ENCOUNTER — Encounter (HOSPITAL_COMMUNITY): Payer: Self-pay | Admitting: Orthopedic Surgery

## 2014-06-19 ENCOUNTER — Emergency Department (HOSPITAL_COMMUNITY)
Admission: EM | Admit: 2014-06-19 | Discharge: 2014-06-19 | Disposition: A | Discharge status: Home / Self Care | Payer: Medicaid Other | Attending: Emergency Medicine | Admitting: Emergency Medicine

## 2014-06-19 ENCOUNTER — Encounter (HOSPITAL_COMMUNITY): Payer: Self-pay | Admitting: Emergency Medicine

## 2014-06-19 ENCOUNTER — Emergency Department (HOSPITAL_COMMUNITY): Payer: Medicaid Other

## 2014-06-19 DIAGNOSIS — Y9389 Activity, other specified: Secondary | ICD-10-CM | POA: Diagnosis not present

## 2014-06-19 DIAGNOSIS — Z8781 Personal history of (healed) traumatic fracture: Secondary | ICD-10-CM | POA: Diagnosis not present

## 2014-06-19 DIAGNOSIS — F419 Anxiety disorder, unspecified: Secondary | ICD-10-CM | POA: Insufficient documentation

## 2014-06-19 DIAGNOSIS — Y998 Other external cause status: Secondary | ICD-10-CM | POA: Diagnosis not present

## 2014-06-19 DIAGNOSIS — Z932 Ileostomy status: Secondary | ICD-10-CM | POA: Insufficient documentation

## 2014-06-19 DIAGNOSIS — Z91040 Latex allergy status: Secondary | ICD-10-CM | POA: Insufficient documentation

## 2014-06-19 DIAGNOSIS — M25572 Pain in left ankle and joints of left foot: Secondary | ICD-10-CM

## 2014-06-19 DIAGNOSIS — Z720 Tobacco use: Secondary | ICD-10-CM | POA: Insufficient documentation

## 2014-06-19 DIAGNOSIS — Q059 Spina bifida, unspecified: Secondary | ICD-10-CM | POA: Diagnosis not present

## 2014-06-19 DIAGNOSIS — S8992XA Unspecified injury of left lower leg, initial encounter: Secondary | ICD-10-CM | POA: Diagnosis present

## 2014-06-19 DIAGNOSIS — Z8701 Personal history of pneumonia (recurrent): Secondary | ICD-10-CM | POA: Diagnosis not present

## 2014-06-19 DIAGNOSIS — Y92092 Bedroom in other non-institutional residence as the place of occurrence of the external cause: Secondary | ICD-10-CM | POA: Insufficient documentation

## 2014-06-19 DIAGNOSIS — Z8719 Personal history of other diseases of the digestive system: Secondary | ICD-10-CM | POA: Diagnosis not present

## 2014-06-19 DIAGNOSIS — S8002XA Contusion of left knee, initial encounter: Secondary | ICD-10-CM | POA: Diagnosis not present

## 2014-06-19 DIAGNOSIS — Z87442 Personal history of urinary calculi: Secondary | ICD-10-CM | POA: Diagnosis not present

## 2014-06-19 DIAGNOSIS — S99911A Unspecified injury of right ankle, initial encounter: Secondary | ICD-10-CM | POA: Diagnosis not present

## 2014-06-19 DIAGNOSIS — W06XXXA Fall from bed, initial encounter: Secondary | ICD-10-CM | POA: Insufficient documentation

## 2014-06-19 DIAGNOSIS — Z87718 Personal history of other specified (corrected) congenital malformations of genitourinary system: Secondary | ICD-10-CM | POA: Diagnosis not present

## 2014-06-19 DIAGNOSIS — W19XXXA Unspecified fall, initial encounter: Secondary | ICD-10-CM

## 2014-06-19 MED ORDER — HYDROMORPHONE HCL 1 MG/ML IJ SOLN
1.0000 mg | Freq: Once | INTRAMUSCULAR | Status: AC
  Administered 2014-06-19: 1 mg via INTRAMUSCULAR
  Filled 2014-06-19: qty 1

## 2014-06-19 MED ORDER — MORPHINE SULFATE 4 MG/ML IJ SOLN
4.0000 mg | Freq: Once | INTRAMUSCULAR | Status: DC
  Filled 2014-06-19: qty 1

## 2014-06-19 MED ORDER — MORPHINE SULFATE 4 MG/ML IJ SOLN
4.0000 mg | Freq: Once | INTRAMUSCULAR | Status: AC
  Administered 2014-06-19: 4 mg via INTRAMUSCULAR

## 2014-06-19 MED ORDER — ONDANSETRON 4 MG PO TBDP
4.0000 mg | ORAL_TABLET | Freq: Once | ORAL | Status: AC
  Administered 2014-06-19: 4 mg via ORAL
  Filled 2014-06-19: qty 1
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
## 2014-06-19 NOTE — Discharge Instructions (Signed)
Pain Relief Preoperatively and Postoperatively °Being a good patient does not mean being a silent one. If you have questions, problems, or concerns about the pain you may feel after surgery, let your caregiver know. Patients have the right to assessment and management of pain. The treatment of pain after surgery is important to speed up recovery and return to normal activities. Severe pain after surgery, and the fear or anxiety associated with that pain, may cause extreme discomfort that: °· Prevents sleep. °· Decreases the ability to breathe deeply and cough. This can cause pneumonia or other upper airway infections. °· Causes your heart to beat faster and your blood pressure to be higher. °· Increases the risk for constipation and bloating. °· Decreases the ability of wounds to heal. °· May result in depression, increased anxiety, and feelings of helplessness. °Relief of pain before surgery is also important because it will lessen the pain after surgery. Patients who receive both pain relief before and after surgery experience greater pain relief than those who only receive pain relief after surgery. Let your caregiver know if you are having uncontrolled pain. This is very important. Pain after surgery is more difficult to manage if it is permitted to become severe, so prompt and adequate treatment of acute pain is necessary. °PAIN CONTROL METHODS °Your caregivers follow policies and procedures about the management of patient pain. These guidelines should be explained to you before surgery. Plans for pain control after surgery must be mutually decided upon and instituted with your full understanding and agreement. Do not be afraid to ask questions regarding the care you are receiving. There are many different ways your caregivers will attempt to control your pain, including the following methods. °As needed pain control °· You may be given pain medicine either through your intravenous (IV) tube, or as a pill or  liquid you can swallow. You will need to let your caregiver know when you are having pain. Then, your caregiver will give you the pain medicine ordered for you. °· Your pain medicine may make you constipated. If constipation occurs, drink more liquids if you can. Your caregiver may have you take a mild laxative. °IV patient-controlled analgesia pump (PCA pump) °· You can get your pain medicine through the IV tube which goes into your vein. You are able to control the amount of pain medicine that you get. The pain medicine flows in through an IV tube and is controlled by a pump. This pump gives you a set amount of pain medicine when you push the button hooked up to it. Nobody should push this button but you or someone specifically assigned by you to do so. It is set up to keep you from accidentally giving yourself too much pain medicine. You will be able to start using your pain pump in the recovery room after your surgery. This method can be helpful for most types of surgery. °· If you are still having too much pain, tell your caregiver. Also, tell your caregiver if you are feeling too sleepy or nauseous. °Continuous epidural pain control °· A thin, soft tube (catheter) is put into your back. Pain medicine flows through the catheter to lessen pain in the part of your body where the surgery is done. Continuous epidural pain control may work best for you if you are having surgery on your chest, abdomen, hip area, or legs. The epidural catheter is usually put into your back just before surgery. The catheter is left in until you can eat and take medicine by mouth. In most cases,   this may take 2 to 3 days. °· Giving pain medicine through the epidural catheter may help you heal faster because: °¨ Your bowel gets back to normal faster. °¨ You can get back to eating sooner. °¨ You can be up and walking sooner. °Medicine that numbs the area (local anesthetic) °· You may receive an injection of pain medicine near where the  pain is (local infiltration). °· You may receive an injection of pain medicine near the nerve that controls the sensation to a specific part of the body (peripheral nerve block). °· Medicine may be put in the spine to block pain (spinal block). °Opioids °· Moderate to moderately severe acute pain after surgery may respond to opioids. Opioids are narcotic pain medicine. Opioids are often combined with non-narcotic medicines to improve pain relief, diminish the risk of side effects, and reduce the chance of addiction. °· If you follow your caregiver's directions about taking opioids and you do not have a history of substance abuse, your risk of becoming addicted is exceptionally small. Opioids are given for short periods of time in careful doses to prevent addiction. °Other methods of pain control include: °· Steroids. °· Physical therapy. °· Heat and cold therapy. °· Compression, such as wrapping an elastic bandage around the area of pain. °· Massage. °These various ways of controlling pain may be used together. Combining different methods of pain control is called multimodal analgesia. Using this approach has many benefits, including being able to eat, move around, and leave the hospital sooner. °Document Released: 05/15/2002 Document Revised: 05/17/2011 Document Reviewed: 05/19/2010 °[employer]® Patient Information ©2015 [employer]. This information is not intended to replace advice given to you by your health care provider. Make sure you discuss any questions you have with your health care provider. ° °

## 2014-07-08 ENCOUNTER — Encounter (HOSPITAL_BASED_OUTPATIENT_CLINIC_OR_DEPARTMENT_OTHER): Payer: No Typology Code available for payment source
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
## 2018-05-30 ENCOUNTER — Emergency Department (HOSPITAL_COMMUNITY)
Admission: EM | Admit: 2018-05-30 | Discharge: 2018-05-30 | Disposition: A | Discharge status: Home / Self Care | Payer: Medicaid Other | Attending: Emergency Medicine | Admitting: Emergency Medicine

## 2018-05-30 ENCOUNTER — Encounter (HOSPITAL_COMMUNITY): Payer: Self-pay | Admitting: Emergency Medicine

## 2018-05-30 ENCOUNTER — Other Ambulatory Visit: Payer: Self-pay

## 2018-05-30 DIAGNOSIS — F17210 Nicotine dependence, cigarettes, uncomplicated: Secondary | ICD-10-CM | POA: Insufficient documentation

## 2018-05-30 DIAGNOSIS — L03116 Cellulitis of left lower limb: Secondary | ICD-10-CM | POA: Insufficient documentation

## 2018-05-30 DIAGNOSIS — S8002XD Contusion of left knee, subsequent encounter: Secondary | ICD-10-CM | POA: Insufficient documentation

## 2018-05-30 DIAGNOSIS — Z91040 Latex allergy status: Secondary | ICD-10-CM | POA: Insufficient documentation

## 2018-05-30 DIAGNOSIS — Q059 Spina bifida, unspecified: Secondary | ICD-10-CM | POA: Insufficient documentation

## 2018-05-30 DIAGNOSIS — Z91010 Allergy to peanuts: Secondary | ICD-10-CM | POA: Diagnosis not present

## 2018-05-30 MED ORDER — HYDROMORPHONE HCL 1 MG/ML IJ SOLN
1.0000 mg | Freq: Once | INTRAMUSCULAR | Status: AC
  Administered 2018-05-30: 1 mg via INTRAMUSCULAR
  Filled 2018-05-30: qty 1

## 2018-05-30 MED ORDER — DOXYCYCLINE HYCLATE 100 MG PO TABS
100.0000 mg | ORAL_TABLET | Freq: Once | ORAL | Status: AC
  Administered 2018-05-30: 100 mg via ORAL
  Filled 2018-05-30: qty 1

## 2018-05-30 MED ORDER — DOXYCYCLINE HYCLATE 100 MG PO CAPS: 100.0000 mg | ORAL_CAPSULE | Freq: Two times a day (BID) | ORAL | 0 refills | Status: DC

## 2018-05-30 NOTE — ED Notes (Addendum)
Patient left knee had a 3cm stage II. Patient states that it was a blister that popped. It has pink tissue.
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
## 2022-10-21 ENCOUNTER — Ambulatory Visit (HOSPITAL_BASED_OUTPATIENT_CLINIC_OR_DEPARTMENT_OTHER): Payer: Medicaid Other | Admitting: General Surgery

## 2022-11-22 ENCOUNTER — Encounter (HOSPITAL_BASED_OUTPATIENT_CLINIC_OR_DEPARTMENT_OTHER): Payer: No Typology Code available for payment source | Admitting: Internal Medicine

## 2023-01-31 ENCOUNTER — Other Ambulatory Visit (HOSPITAL_BASED_OUTPATIENT_CLINIC_OR_DEPARTMENT_OTHER): Payer: Self-pay

## 2023-01-31 MED ORDER — WEGOVY 0.25 MG/0.5ML ~~LOC~~ SOAJ
0.2500 mg | SUBCUTANEOUS | 0 refills | Status: DC
  Filled 2023-01-31: qty 2, 28d supply, fill #0

## 2023-02-01 ENCOUNTER — Other Ambulatory Visit (HOSPITAL_BASED_OUTPATIENT_CLINIC_OR_DEPARTMENT_OTHER): Payer: Self-pay

## 2023-02-02 ENCOUNTER — Other Ambulatory Visit (HOSPITAL_BASED_OUTPATIENT_CLINIC_OR_DEPARTMENT_OTHER): Payer: Self-pay

## 2023-02-07 ENCOUNTER — Other Ambulatory Visit (HOSPITAL_BASED_OUTPATIENT_CLINIC_OR_DEPARTMENT_OTHER): Payer: Self-pay

## 2023-02-07 ENCOUNTER — Other Ambulatory Visit (HOSPITAL_COMMUNITY): Payer: Self-pay

## 2023-02-08 ENCOUNTER — Other Ambulatory Visit (HOSPITAL_BASED_OUTPATIENT_CLINIC_OR_DEPARTMENT_OTHER): Payer: Self-pay

## 2023-02-09 ENCOUNTER — Other Ambulatory Visit (HOSPITAL_BASED_OUTPATIENT_CLINIC_OR_DEPARTMENT_OTHER): Payer: Self-pay

## 2023-02-10 ENCOUNTER — Other Ambulatory Visit (HOSPITAL_BASED_OUTPATIENT_CLINIC_OR_DEPARTMENT_OTHER): Payer: Self-pay

## 2023-02-14 ENCOUNTER — Other Ambulatory Visit (HOSPITAL_BASED_OUTPATIENT_CLINIC_OR_DEPARTMENT_OTHER): Payer: Self-pay

## 2023-02-15 ENCOUNTER — Other Ambulatory Visit (HOSPITAL_BASED_OUTPATIENT_CLINIC_OR_DEPARTMENT_OTHER): Payer: Self-pay

## 2023-02-17 ENCOUNTER — Other Ambulatory Visit (HOSPITAL_BASED_OUTPATIENT_CLINIC_OR_DEPARTMENT_OTHER): Payer: Self-pay

## 2023-02-18 ENCOUNTER — Other Ambulatory Visit (HOSPITAL_BASED_OUTPATIENT_CLINIC_OR_DEPARTMENT_OTHER): Payer: Self-pay

## 2023-03-08 ENCOUNTER — Other Ambulatory Visit (HOSPITAL_BASED_OUTPATIENT_CLINIC_OR_DEPARTMENT_OTHER): Payer: Self-pay

## 2023-03-31 ENCOUNTER — Other Ambulatory Visit (HOSPITAL_BASED_OUTPATIENT_CLINIC_OR_DEPARTMENT_OTHER): Payer: Self-pay

## 2023-03-31 MED ORDER — WEGOVY 0.5 MG/0.5ML ~~LOC~~ SOAJ
0.5000 mg | SUBCUTANEOUS | 0 refills | Status: DC
  Filled 2023-03-31 – 2023-05-13 (×2): qty 2, 28d supply, fill #0

## 2023-04-01 ENCOUNTER — Other Ambulatory Visit (HOSPITAL_BASED_OUTPATIENT_CLINIC_OR_DEPARTMENT_OTHER): Payer: Self-pay

## 2023-04-04 ENCOUNTER — Other Ambulatory Visit (HOSPITAL_BASED_OUTPATIENT_CLINIC_OR_DEPARTMENT_OTHER): Payer: Self-pay

## 2023-04-05 ENCOUNTER — Other Ambulatory Visit (HOSPITAL_BASED_OUTPATIENT_CLINIC_OR_DEPARTMENT_OTHER): Payer: Self-pay

## 2023-04-07 ENCOUNTER — Other Ambulatory Visit (HOSPITAL_BASED_OUTPATIENT_CLINIC_OR_DEPARTMENT_OTHER): Payer: Self-pay

## 2023-04-08 ENCOUNTER — Other Ambulatory Visit (HOSPITAL_BASED_OUTPATIENT_CLINIC_OR_DEPARTMENT_OTHER): Payer: Self-pay

## 2023-04-11 ENCOUNTER — Other Ambulatory Visit (HOSPITAL_BASED_OUTPATIENT_CLINIC_OR_DEPARTMENT_OTHER): Payer: Self-pay

## 2023-04-12 ENCOUNTER — Other Ambulatory Visit (HOSPITAL_BASED_OUTPATIENT_CLINIC_OR_DEPARTMENT_OTHER): Payer: Self-pay

## 2023-04-13 ENCOUNTER — Other Ambulatory Visit (HOSPITAL_BASED_OUTPATIENT_CLINIC_OR_DEPARTMENT_OTHER): Payer: Self-pay

## 2023-04-14 ENCOUNTER — Other Ambulatory Visit (HOSPITAL_BASED_OUTPATIENT_CLINIC_OR_DEPARTMENT_OTHER): Payer: Self-pay

## 2023-04-15 ENCOUNTER — Other Ambulatory Visit (HOSPITAL_BASED_OUTPATIENT_CLINIC_OR_DEPARTMENT_OTHER): Payer: Self-pay

## 2023-04-18 ENCOUNTER — Other Ambulatory Visit (HOSPITAL_BASED_OUTPATIENT_CLINIC_OR_DEPARTMENT_OTHER): Payer: Self-pay

## 2023-04-19 ENCOUNTER — Other Ambulatory Visit (HOSPITAL_BASED_OUTPATIENT_CLINIC_OR_DEPARTMENT_OTHER): Payer: Self-pay

## 2023-04-20 ENCOUNTER — Other Ambulatory Visit (HOSPITAL_BASED_OUTPATIENT_CLINIC_OR_DEPARTMENT_OTHER): Payer: Self-pay

## 2023-04-21 ENCOUNTER — Other Ambulatory Visit (HOSPITAL_BASED_OUTPATIENT_CLINIC_OR_DEPARTMENT_OTHER): Payer: Self-pay

## 2023-04-22 ENCOUNTER — Other Ambulatory Visit (HOSPITAL_BASED_OUTPATIENT_CLINIC_OR_DEPARTMENT_OTHER): Payer: Self-pay

## 2023-04-25 ENCOUNTER — Other Ambulatory Visit (HOSPITAL_BASED_OUTPATIENT_CLINIC_OR_DEPARTMENT_OTHER): Payer: Self-pay

## 2023-04-26 ENCOUNTER — Other Ambulatory Visit (HOSPITAL_BASED_OUTPATIENT_CLINIC_OR_DEPARTMENT_OTHER): Payer: Self-pay

## 2023-04-27 ENCOUNTER — Other Ambulatory Visit (HOSPITAL_BASED_OUTPATIENT_CLINIC_OR_DEPARTMENT_OTHER): Payer: Self-pay

## 2023-05-13 ENCOUNTER — Other Ambulatory Visit: Payer: Self-pay

## 2023-05-13 ENCOUNTER — Other Ambulatory Visit (HOSPITAL_BASED_OUTPATIENT_CLINIC_OR_DEPARTMENT_OTHER): Payer: Self-pay

## 2023-05-17 ENCOUNTER — Other Ambulatory Visit (HOSPITAL_BASED_OUTPATIENT_CLINIC_OR_DEPARTMENT_OTHER): Payer: Self-pay

## 2023-12-12 ENCOUNTER — Inpatient Hospital Stay (HOSPITAL_COMMUNITY)
Admission: EM | Admit: 2023-12-12 | Discharge: 2023-12-19 | DRG: 189 | Disposition: A | Discharge status: Home / Self Care | Attending: Internal Medicine | Admitting: Internal Medicine

## 2023-12-12 ENCOUNTER — Encounter (HOSPITAL_COMMUNITY): Payer: Self-pay

## 2023-12-12 ENCOUNTER — Other Ambulatory Visit: Payer: Self-pay

## 2023-12-12 ENCOUNTER — Emergency Department (HOSPITAL_COMMUNITY)

## 2023-12-12 DIAGNOSIS — I272 Pulmonary hypertension, unspecified: Secondary | ICD-10-CM | POA: Diagnosis present

## 2023-12-12 DIAGNOSIS — Q423 Congenital absence, atresia and stenosis of anus without fistula: Secondary | ICD-10-CM | POA: Diagnosis not present

## 2023-12-12 DIAGNOSIS — Z7951 Long term (current) use of inhaled steroids: Secondary | ICD-10-CM | POA: Diagnosis not present

## 2023-12-12 DIAGNOSIS — R109 Unspecified abdominal pain: Secondary | ICD-10-CM | POA: Diagnosis present

## 2023-12-12 DIAGNOSIS — Z6841 Body Mass Index (BMI) 40.0 and over, adult: Secondary | ICD-10-CM | POA: Diagnosis not present

## 2023-12-12 DIAGNOSIS — Z932 Ileostomy status: Secondary | ICD-10-CM | POA: Diagnosis not present

## 2023-12-12 DIAGNOSIS — G894 Chronic pain syndrome: Secondary | ICD-10-CM | POA: Diagnosis present

## 2023-12-12 DIAGNOSIS — J9811 Atelectasis: Secondary | ICD-10-CM | POA: Diagnosis present

## 2023-12-12 DIAGNOSIS — Z1152 Encounter for screening for COVID-19: Secondary | ICD-10-CM

## 2023-12-12 DIAGNOSIS — J9621 Acute and chronic respiratory failure with hypoxia: Principal | ICD-10-CM | POA: Diagnosis present

## 2023-12-12 DIAGNOSIS — Z9981 Dependence on supplemental oxygen: Secondary | ICD-10-CM

## 2023-12-12 DIAGNOSIS — Z91018 Allergy to other foods: Secondary | ICD-10-CM

## 2023-12-12 DIAGNOSIS — J441 Chronic obstructive pulmonary disease with (acute) exacerbation: Secondary | ICD-10-CM | POA: Diagnosis present

## 2023-12-12 DIAGNOSIS — Q059 Spina bifida, unspecified: Secondary | ICD-10-CM

## 2023-12-12 DIAGNOSIS — Z91012 Allergy to eggs, unspecified: Secondary | ICD-10-CM

## 2023-12-12 DIAGNOSIS — K219 Gastro-esophageal reflux disease without esophagitis: Secondary | ICD-10-CM | POA: Diagnosis present

## 2023-12-12 DIAGNOSIS — Z79891 Long term (current) use of opiate analgesic: Secondary | ICD-10-CM

## 2023-12-12 DIAGNOSIS — Z79899 Other long term (current) drug therapy: Secondary | ICD-10-CM

## 2023-12-12 DIAGNOSIS — Z8719 Personal history of other diseases of the digestive system: Secondary | ICD-10-CM | POA: Diagnosis not present

## 2023-12-12 DIAGNOSIS — Z87728 Personal history of other specified (corrected) congenital malformations of nervous system and sense organs: Secondary | ICD-10-CM

## 2023-12-12 DIAGNOSIS — E66813 Obesity, class 3: Secondary | ICD-10-CM | POA: Diagnosis present

## 2023-12-12 DIAGNOSIS — Z91040 Latex allergy status: Secondary | ICD-10-CM

## 2023-12-12 DIAGNOSIS — Q6410 Exstrophy of urinary bladder, unspecified: Secondary | ICD-10-CM | POA: Diagnosis not present

## 2023-12-12 DIAGNOSIS — Q792 Exomphalos: Secondary | ICD-10-CM

## 2023-12-12 DIAGNOSIS — Z91010 Allergy to peanuts: Secondary | ICD-10-CM

## 2023-12-12 DIAGNOSIS — F17210 Nicotine dependence, cigarettes, uncomplicated: Secondary | ICD-10-CM | POA: Diagnosis present

## 2023-12-12 DIAGNOSIS — G8929 Other chronic pain: Secondary | ICD-10-CM

## 2023-12-12 DIAGNOSIS — Z886 Allergy status to analgesic agent status: Secondary | ICD-10-CM

## 2023-12-12 DIAGNOSIS — J189 Pneumonia, unspecified organism: Principal | ICD-10-CM

## 2023-12-12 DIAGNOSIS — Z881 Allergy status to other antibiotic agents status: Secondary | ICD-10-CM | POA: Diagnosis not present

## 2023-12-12 DIAGNOSIS — F419 Anxiety disorder, unspecified: Secondary | ICD-10-CM | POA: Diagnosis present

## 2023-12-12 DIAGNOSIS — Z7985 Long-term (current) use of injectable non-insulin antidiabetic drugs: Secondary | ICD-10-CM

## 2023-12-12 LAB — BASIC METABOLIC PANEL WITH GFR
Anion gap: 11 (ref 5–15)
BUN: 10 mg/dL (ref 6–20)
CO2: 27 mmol/L (ref 22–32)
Calcium: 9.3 mg/dL (ref 8.9–10.3)
Chloride: 99 mmol/L (ref 98–111)
Creatinine, Ser: 0.76 mg/dL (ref 0.61–1.24)
GFR, Estimated: >60 mL/min (ref >60)
Glucose, Bld: 118 mg/dL — ABNORMAL HIGH (ref 70–99)
Potassium: 4.4 mmol/L (ref 3.5–5.1)
Sodium: 137 mmol/L (ref 135–145)

## 2023-12-12 LAB — RESP PANEL BY RT-PCR (RSV, FLU A&B, COVID)  RVPGX2
Influenza A by PCR: NEGATIVE
Influenza B by PCR: NEGATIVE
Resp Syncytial Virus by PCR: NEGATIVE
SARS Coronavirus 2 by RT PCR: NEGATIVE

## 2023-12-12 LAB — CBC
HCT: 48.4 % (ref 39.0–52.0)
Hemoglobin: 15.5 g/dL (ref 13.0–17.0)
MCH: 29.1 pg (ref 26.0–34.0)
MCHC: 32 g/dL (ref 30.0–36.0)
MCV: 91 fL (ref 80.0–100.0)
Platelets: 259 K/uL (ref 150–400)
RBC: 5.32 MIL/uL (ref 4.22–5.81)
RDW: 13.1 % (ref 11.5–15.5)
WBC: 11 K/uL — ABNORMAL HIGH (ref 4.0–10.5)
nRBC: 0 % (ref 0.0–0.2)

## 2023-12-12 LAB — PROCALCITONIN: Procalcitonin: <0.1 ng/mL

## 2023-12-12 LAB — I-STAT CG4 LACTIC ACID, ED: Lactic Acid, Venous: 1.8 mmol/L (ref 0.5–1.9)

## 2023-12-12 MED ORDER — SENNOSIDES-DOCUSATE SODIUM 8.6-50 MG PO TABS
1.0000 | ORAL_TABLET | Freq: Every evening | ORAL | Status: DC | PRN
  Administered 2023-12-14 – 2023-12-16 (×2): 1 via ORAL
  Filled 2023-12-12 (×2): qty 1

## 2023-12-12 MED ORDER — SODIUM CHLORIDE 0.9 % IV SOLN
100.0000 mg | Freq: Once | INTRAVENOUS | Status: AC
  Administered 2023-12-12: 100 mg via INTRAVENOUS
  Filled 2023-12-12: qty 100

## 2023-12-12 MED ORDER — IPRATROPIUM-ALBUTEROL 0.5-2.5 (3) MG/3ML IN SOLN
3.0000 mL | Freq: Once | RESPIRATORY_TRACT | Status: AC
  Administered 2023-12-12: 3 mL via RESPIRATORY_TRACT
  Filled 2023-12-12: qty 3

## 2023-12-12 MED ORDER — ACETAMINOPHEN 325 MG PO TABS: 650.0000 mg | ORAL_TABLET | Freq: Four times a day (QID) | ORAL | Status: DC | PRN

## 2023-12-12 MED ORDER — OXYMORPHONE HCL ER 20 MG PO TB12: 20.0000 mg | ORAL_TABLET | ORAL | Status: DC

## 2023-12-12 MED ORDER — FENTANYL CITRATE PF 50 MCG/ML IJ SOSY
50.0000 ug | PREFILLED_SYRINGE | Freq: Once | INTRAMUSCULAR | Status: AC
  Administered 2023-12-12: 50 ug via INTRAVENOUS
  Filled 2023-12-12: qty 1

## 2023-12-12 MED ORDER — PREDNISONE 20 MG PO TABS
40.0000 mg | ORAL_TABLET | Freq: Every day | ORAL | Status: DC
Start: 2023-12-13 — End: 2023-12-17

## 2023-12-12 MED ORDER — IPRATROPIUM-ALBUTEROL 0.5-2.5 (3) MG/3ML IN SOLN
3.0000 mL | Freq: Once | RESPIRATORY_TRACT | Status: AC
  Administered 2023-12-12: 3 mL via RESPIRATORY_TRACT

## 2023-12-12 MED ORDER — HYDROMORPHONE HCL 1 MG/ML IJ SOLN
1.0000 mg | Freq: Once | INTRAMUSCULAR | Status: AC
  Administered 2023-12-12: 1 mg via INTRAVENOUS
  Filled 2023-12-12: qty 1

## 2023-12-12 MED ORDER — IPRATROPIUM-ALBUTEROL 0.5-2.5 (3) MG/3ML IN SOLN: 3.0000 mL | Freq: Four times a day (QID) | RESPIRATORY_TRACT | Status: DC | PRN

## 2023-12-12 MED ORDER — GUAIFENESIN 100 MG/5ML PO LIQD
5.0000 mL | ORAL | Status: DC | PRN
  Administered 2023-12-12 – 2023-12-14 (×4): 5 mL via ORAL
  Filled 2023-12-12 (×4): qty 10

## 2023-12-12 MED ORDER — ENOXAPARIN SODIUM 60 MG/0.6ML IJ SOSY
50.0000 mg | PREFILLED_SYRINGE | INTRAMUSCULAR | Status: DC
  Administered 2023-12-12 – 2023-12-17 (×6): 50 mg via SUBCUTANEOUS
  Filled 2023-12-12 (×8): qty 0.6

## 2023-12-12 MED ORDER — AZITHROMYCIN 500 MG PO TABS
500.0000 mg | ORAL_TABLET | Freq: Every day | ORAL | Status: AC
Start: 2023-12-13 — End: 2023-12-16
  Administered 2023-12-13 – 2023-12-15 (×3): 500 mg via ORAL
  Filled 2023-12-12 (×3): qty 1

## 2023-12-12 MED ORDER — DM-GUAIFENESIN ER 30-600 MG PO TB12
1.0000 | ORAL_TABLET | Freq: Two times a day (BID) | ORAL | Status: DC
  Administered 2023-12-12 – 2023-12-19 (×14): 1 via ORAL
  Filled 2023-12-12 (×14): qty 1

## 2023-12-12 MED ORDER — ACETAMINOPHEN 650 MG RE SUPP: 650.0000 mg | Freq: Four times a day (QID) | RECTAL | Status: DC | PRN

## 2023-12-12 MED ORDER — ONDANSETRON HCL 4 MG PO TABS: 4.0000 mg | ORAL_TABLET | Freq: Four times a day (QID) | ORAL | Status: DC | PRN

## 2023-12-12 MED ORDER — BISACODYL 5 MG PO TBEC
5.0000 mg | DELAYED_RELEASE_TABLET | Freq: Every day | ORAL | Status: DC | PRN
  Administered 2023-12-14 – 2023-12-15 (×2): 5 mg via ORAL
  Filled 2023-12-12 (×2): qty 1

## 2023-12-12 MED ORDER — IPRATROPIUM-ALBUTEROL 0.5-2.5 (3) MG/3ML IN SOLN
3.0000 mL | Freq: Three times a day (TID) | RESPIRATORY_TRACT | Status: DC
  Administered 2023-12-13: 3 mL via RESPIRATORY_TRACT
  Filled 2023-12-12: qty 3

## 2023-12-12 MED ORDER — FLUTICASONE FUROATE-VILANTEROL 200-25 MCG/ACT IN AEPB
1.0000 | INHALATION_SPRAY | Freq: Every day | RESPIRATORY_TRACT | Status: DC
  Administered 2023-12-14: 1 via RESPIRATORY_TRACT
  Filled 2023-12-12 (×2): qty 28

## 2023-12-12 MED ORDER — OXYCODONE HCL 5 MG PO TABS
20.0000 mg | ORAL_TABLET | Freq: Two times a day (BID) | ORAL | Status: DC
  Administered 2023-12-12: 20 mg via ORAL
  Filled 2023-12-12: qty 4

## 2023-12-12 MED ORDER — METHYLPREDNISOLONE SODIUM SUCC 125 MG IJ SOLR
125.0000 mg | Freq: Once | INTRAMUSCULAR | Status: AC
  Administered 2023-12-12: 125 mg via INTRAVENOUS
  Filled 2023-12-12: qty 2

## 2023-12-12 MED ORDER — HYDROMORPHONE HCL 1 MG/ML IJ SOLN
2.0000 mg | INTRAMUSCULAR | Status: DC | PRN
  Administered 2023-12-12 – 2023-12-14 (×9): 2 mg via INTRAVENOUS
  Filled 2023-12-12 (×10): qty 2

## 2023-12-12 MED ORDER — LEVOFLOXACIN IN D5W 750 MG/150ML IV SOLN
750.0000 mg | INTRAVENOUS | Status: DC
  Administered 2023-12-12: 750 mg via INTRAVENOUS
  Filled 2023-12-12: qty 150

## 2023-12-12 MED ORDER — ONDANSETRON HCL 4 MG/2ML IJ SOLN
4.0000 mg | Freq: Four times a day (QID) | INTRAMUSCULAR | Status: DC | PRN
  Administered 2023-12-13 – 2023-12-18 (×14): 4 mg via INTRAVENOUS
  Filled 2023-12-12 (×15): qty 2
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
## 2023-12-12 NOTE — ED Provider Notes (Signed)
 [place] EMERGENCY DEPARTMENT AT [facility] Provider Note   CSN: [id number] Arrival date & time: 12/12/23  1723     Patient presents with: Shortness of Breath   Blake Lara is a 43 y.o. male.   43 year old male with past medical history of morbid obesity and COPD on home oxygen at night normally presenting to the emergency department today with cough and shortness of breath.  The patient states this been going on now for the past few days.  Reports that he has had a productive cough during that time.  He denies any fevers.  Denies any hemoptysis.  He has been having some bilateral lower extremity swelling.  He has had some chest discomfort with coughing.  He came to the ER for further evaluation regarding this.   Shortness of Breath Associated symptoms: cough and wheezing        Prior to Admission medications   Medication Sig Start Date End Date Taking? Authorizing Provider  Oxycodone  HCl 20 MG TABS Take 20 mg by mouth See admin instructions. Take 20 mg by mouth at 8 AM and 8 PM   Yes [provider]  OXYGEN Inhale 2 L/min into the lungs at bedtime.   Yes [provider]  oxymorphone (OPANA ER) 20 MG 12 hr tablet Take 20 mg by mouth See admin instructions. Take 20 mg by mouth at 6 AM and 6 PM   Yes [provider]  WEGOVY  2.4 MG/0.75ML SOAJ SQ injection Inject 2.4 mg into the skin every Monday.   Yes [provider]  docusate sodium  100 MG CAPS Take 100 mg by mouth 2 (two) times daily as needed for mild constipation. Patient not taking: Reported on 12/12/2023 01/23/14   Levern Beckey HERO, MD  doxycycline  (VIBRAMYCIN ) 100 MG capsule Take 1 capsule (100 mg total) by mouth 2 (two) times daily. Patient not taking: Reported on 12/12/2023 05/30/18   Loetta Senior, MD  oxycodone  (ROXICODONE ) 30 MG immediate release tablet Take 1 tablet (30 mg total) by mouth every 4 (four) hours as needed for pain (every 4-6 hrs as needed). Patient not taking:  Reported on 12/12/2023 05/05/14   Randol Simmonds, MD  Semaglutide -Weight Management (WEGOVY ) 0.25 MG/0.5ML SOAJ Inject 0.25 mg into the skin once a week. Patient not taking: Reported on 12/12/2023 01/31/23     Semaglutide -Weight Management (WEGOVY ) 0.5 MG/0.5ML SOAJ Inject 0.5 mg into the skin once a week. Patient not taking: Reported on 12/12/2023 03/31/23       Allergies: Ceftriaxone , Egg-derived products, Latex, Morphine , Naproxen sodium, Peanut oil, Peanut-containing drug products, Ibuprofen, Corn oil, Corn-containing products, and Lentil    Review of Systems  Respiratory:  Positive for cough, shortness of breath and wheezing.   All other systems reviewed and are negative.   Updated Vital Signs BP 136/85   Pulse 97   Temp 98.4 F (36.9 C) (Oral)   Resp (!) 22   Ht 4' 11 (1.499 m)   Wt 98.9 kg   SpO2 93%   BMI 44.03 kg/m   Physical Exam Vitals and nursing note reviewed.   Gen: Mild conversational dyspnea noted Eyes: PERRL, EOMI HEENT: no oropharyngeal swelling Neck: trachea midline Resp: Diminished with diffuse wheezes throughout all lung fields Card: RRR, no murmurs, rubs, or gallops Abd: nontender, nondistended Extremities: no calf tenderness, no edema Vascular: 2+ radial pulses bilaterally, 2+ DP pulses bilaterally Skin: no rashes Psyc: acting appropriately   (all labs ordered are listed, but only abnormal results are displayed)  Labs Reviewed  BASIC METABOLIC PANEL WITH GFR - Abnormal; Notable for the following components:      Result Value   Glucose, Bld 118 (*)    All other components within normal limits  CBC - Abnormal; Notable for the following components:   WBC 11.0 (*)    All other components within normal limits  RESP PANEL BY RT-PCR (RSV, FLU A&B, COVID)  RVPGX2  CULTURE, BLOOD (ROUTINE X 2)  CULTURE, BLOOD (ROUTINE X 2)  I-STAT CG4 LACTIC ACID, ED  I-STAT CG4 LACTIC ACID, ED    EKG: EKG Interpretation Date/Time:  Monday December 12 2023 17:41:38  EDT Ventricular Rate:  97 PR Interval:  128 QRS Duration:  102 QT Interval:  336 QTC Calculation: 427 R Axis:   239  Text Interpretation: Sinus rhythm Low voltage, precordial leads Consider right ventricular hypertrophy Confirmed by Ula Barter [phone]) on 12/12/2023 5:43:54 PM  Radiology: [facility] Chest Port 1 View Result Date: 12/12/2023 CLINICAL DATA:  Shortness of breath.  Chest pain. EXAM: PORTABLE CHEST 1 VIEW COMPARISON:  Radiograph 05/05/2014 FINDINGS: Lung volumes are low. Stable heart size and mediastinal contours. Streaky bibasilar opacities. Bronchovascular crowding related to low lung volumes. Elevated left hemidiaphragm with associated subjacent gaseous gastric distension. No pneumothorax or large pleural effusion. IMPRESSION: 1. Low lung volumes with streaky bibasilar opacities, favor atelectasis. 2. Elevated left hemidiaphragm with subjacent gaseous gastric distension. Electronically Signed   By: Andrea Gasman M.D.   On: 12/12/2023 18:29     Procedures   Medications Ordered in the ED  doxycycline  (VIBRAMYCIN ) 100 mg in sodium chloride  0.9 % 250 mL IVPB (100 mg Intravenous New Bag/Given 12/12/23 1822)  levofloxacin  (LEVAQUIN ) IVPB 750 mg (has no administration in time range)  HYDROmorphone  (DILAUDID ) injection 1 mg (has no administration in time range)  methylPREDNISolone sodium succinate (SOLU-MEDROL) 125 mg/2 mL injection 125 mg (125 mg Intravenous Given 12/12/23 1821)  ipratropium-albuterol (DUONEB) 0.5-2.5 (3) MG/3ML nebulizer solution 3 mL (3 mLs Nebulization Given 12/12/23 1745)  ipratropium-albuterol (DUONEB) 0.5-2.5 (3) MG/3ML nebulizer solution 3 mL (3 mLs Nebulization Given 12/12/23 1748)  ipratropium-albuterol (DUONEB) 0.5-2.5 (3) MG/3ML nebulizer solution 3 mL (3 mLs Nebulization Given 12/12/23 1744)  fentaNYL  (SUBLIMAZE ) injection 50 mcg (50 mcg Intravenous Given 12/12/23 1855)                                    Medical Decision Making 43 year old male with past  medical history of COPD on home oxygen at night as well as morbid obesity presenting to the emergency department today with cough and shortness of breath.  He does have significant wheezing here on exam.  Will go ahead and treat him with Solu-Medrol as well as azithromycin and DuoNebs for likely COPD exacerbation.  Will obtain basic labs as well as a chest x-ray for further evaluation for pneumonia.  He is well tachycardic on arrival so we will obtain blood cultures as well as a lactic acid for further evaluation for possible sepsis.  Will hold off on fluids at this time as the patient blood pressures are within normal limits.  The patient's symptoms did improve with the medications here.  There were questionable infiltrates on x-ray.  The patient is given Levaquin  due to Rocephin  allergy.  He remains on 2 L nasal cannula pulse ox in the low 90s.  Think that he would benefit from admission.  Calls placed to hospital service for admission.  CRITICAL CARE Performed by: Prentice JONELLE Medicus   Total critical care time: 35 minutes  Critical care time was exclusive of separately billable procedures and treating other patients.  Critical care was necessary to treat or prevent imminent or life-threatening deterioration.  Critical care was time spent personally by me on the following activities: development of treatment plan with patient and/or surrogate as well as nursing, discussions with consultants, evaluation of patient's response to treatment, examination of patient, obtaining history from patient or surrogate, ordering and performing treatments and interventions, ordering and review of laboratory studies, ordering and review of radiographic studies, pulse oximetry and re-evaluation of patient's condition.   Amount and/or Complexity of Data Reviewed Labs: ordered. Radiology: ordered.  Risk Prescription drug management.        Final diagnoses:  Pneumonia due to infectious organism, unspecified  laterality, unspecified part of lung  COPD exacerbation [facility])    ED Discharge Orders     None          Medicus Prentice JONELLE, MD 12/12/23 1940

## 2023-12-12 NOTE — ED Notes (Signed)
 Pt provided with [place] sandwich and milk, per pt request.

## 2023-12-12 NOTE — ED Notes (Signed)
 EDP and RT at [facility]

## 2023-12-12 NOTE — ED Notes (Signed)
Xray finished at [facility] 
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
## 2023-12-13 DIAGNOSIS — J9621 Acute and chronic respiratory failure with hypoxia: Secondary | ICD-10-CM | POA: Diagnosis not present

## 2023-12-13 LAB — COMPREHENSIVE METABOLIC PANEL WITH GFR
ALT: 18 U/L (ref 0–44)
AST: 21 U/L (ref 15–41)
Albumin: 3.8 g/dL (ref 3.5–5.0)
Alkaline Phosphatase: 93 U/L (ref 38–126)
Anion gap: 11 (ref 5–15)
BUN: 12 mg/dL (ref 6–20)
CO2: 29 mmol/L (ref 22–32)
Calcium: 9.3 mg/dL (ref 8.9–10.3)
Chloride: 100 mmol/L (ref 98–111)
Creatinine, Ser: 0.76 mg/dL (ref 0.61–1.24)
GFR, Estimated: >60 mL/min (ref >60)
Glucose, Bld: 159 mg/dL — ABNORMAL HIGH (ref 70–99)
Potassium: 4.7 mmol/L (ref 3.5–5.1)
Sodium: 139 mmol/L (ref 135–145)
Total Bilirubin: 0.5 mg/dL (ref 0.0–1.2)
Total Protein: 7.8 g/dL (ref 6.5–8.1)

## 2023-12-13 LAB — CBC
HCT: 45.8 % (ref 39.0–52.0)
Hemoglobin: 14.9 g/dL (ref 13.0–17.0)
MCH: 29.6 pg (ref 26.0–34.0)
MCHC: 32.5 g/dL (ref 30.0–36.0)
MCV: 90.9 fL (ref 80.0–100.0)
Platelets: 243 K/uL (ref 150–400)
RBC: 5.04 MIL/uL (ref 4.22–5.81)
RDW: 13.2 % (ref 11.5–15.5)
WBC: 8.1 K/uL (ref 4.0–10.5)
nRBC: 0 % (ref 0.0–0.2)

## 2023-12-13 LAB — HIV ANTIBODY (ROUTINE TESTING W REFLEX): HIV Screen 4th Generation wRfx: NONREACTIVE

## 2023-12-13 MED ORDER — IPRATROPIUM-ALBUTEROL 0.5-2.5 (3) MG/3ML IN SOLN
3.0000 mL | Freq: Four times a day (QID) | RESPIRATORY_TRACT | Status: DC
  Administered 2023-12-13 – 2023-12-19 (×22): 3 mL via RESPIRATORY_TRACT
  Filled 2023-12-13 (×23): qty 3

## 2023-12-13 MED ORDER — OXYCODONE HCL 5 MG PO TABS
20.0000 mg | ORAL_TABLET | Freq: Four times a day (QID) | ORAL | Status: DC | PRN
  Administered 2023-12-13 – 2023-12-14 (×5): 20 mg via ORAL
  Filled 2023-12-13 (×5): qty 4

## 2023-12-13 MED ORDER — METHYLPREDNISOLONE SODIUM SUCC 125 MG IJ SOLR
80.0000 mg | INTRAMUSCULAR | Status: DC
  Administered 2023-12-13 – 2023-12-16 (×4): 80 mg via INTRAVENOUS
  Filled 2023-12-13 (×4): qty 2

## 2023-12-13 MED ORDER — ALBUTEROL SULFATE (2.5 MG/3ML) 0.083% IN NEBU
2.5000 mg | INHALATION_SOLUTION | RESPIRATORY_TRACT | Status: DC | PRN
  Administered 2023-12-14: 2.5 mg via RESPIRATORY_TRACT
  Filled 2023-12-13: qty 3

## 2023-12-13 NOTE — ED Notes (Signed)
 Patient given lunch tray.

## 2023-12-13 NOTE — Progress Notes (Signed)
 PROGRESS NOTE    Blake Lara  FMW:[id number] DOB: 1981-01-23 DOA: 12/12/2023 PCP: Shelda Atlas, MD   Brief Narrative:  43 y.o. male with medical history significant for OEIS (omphalocele-exstrophy-imperforate anus-spinal defects), spina bifida, tethered cord, bladder extrophy, COPD on 2 L of oxygen at night, morbid obesity, ventral hernias s/p multiple abdominal surgeries and ileostomy, tobacco use disorder, nephrolithiasis, anxiety, chronic pain syndrome and GERD presented with worsening shortness of breath with cough and wheezing.  On presentation, he was tachypneic and requiring 4 L oxygen via nasal cannula.  Chest x-ray showed low lung volumes with streaky basilar opacities, favor atelectasis.  He was started on Solu-Medrol and nebs.  Assessment & Plan:   Acute on chronic hypoxic respiratory failure COPD exacerbation Tobacco use disorder - Normally wears 2 L oxygen by nasal cannula.  Currently on 5 L oxygen by nasal cannula.  Wean off as able.  COVID/influenza/RSV PCR negative.  Chest x-ray as above -Continue Solu-Medrol.  Continue current inhaled and nebulized regimen and Zithromax. - Incentive spirometry/flutter valve - Counseled regarding tobacco cessation.  Spina bifida Multiple hernias status post multiple abdominal surgeries Chronic abdominal pain Status post ileostomy -Chronic and stable.  Continue current pain management regimen.  Outpatient follow-up with PCP and/for pain management  Morbid obesity - Outpatient follow-up    DVT prophylaxis: Lovenox  Code Status: Full Family Communication: None at bedside Disposition Plan: Status is: Inpatient Remains inpatient appropriate because: Of severity of illness    Consultants: None  Procedures: None  Antimicrobials: Zithromax   Subjective: Patient seen and examined at bedside.  Still short of breath with minimal exertion with intermittent cough. Feels rough. No fever or vomiting reported.  Objective: Vitals:    12/12/23 2345 12/13/23 0131 12/13/23 0437 12/13/23 0532  BP: 115/76 110/68 121/74   Pulse: (!) 101 93 91   Resp: (!) 24 20 17    Temp:  97.9 F (36.6 C)  98 F (36.7 C)  TempSrc:  Oral  Oral  SpO2: 91% 92% 91%   Weight:      Height:       No intake or output data in the 24 hours ending 12/13/23 0732 Filed Weights   12/12/23 1731  Weight: 98.9 kg    Examination:  General exam: Appears calm and comfortable.  Currently on 5 L oxygen by nasal cannula. Respiratory system: Bilateral decreased breath sounds at bases with scattered crackles and some wheezing Cardiovascular system: S1 & S2 heard, Rate controlled Gastrointestinal system: Abdomen is morbidly obese, nondistended, soft and nontender. Normal bowel sounds heard.  Ileostomy present. Extremities: No cyanosis, clubbing, edema  Central nervous system: Alert and oriented.  contracted lower extremities  skin: No rashes, lesions or ulcers Psychiatry: Judgement and insight appear normal. Mood & affect appropriate.     Data Reviewed: I have personally reviewed following labs and imaging studies  CBC: Recent Labs  Lab 12/12/23 1750 12/13/23 0500  WBC 11.0* 8.1  HGB 15.5 14.9  HCT 48.4 45.8  MCV 91.0 90.9  PLT 259 243   Basic Metabolic Panel: Recent Labs  Lab 12/12/23 1750  NA 137  K 4.4  CL 99  CO2 27  GLUCOSE 118*  BUN 10  CREATININE 0.76  CALCIUM 9.3   GFR: Estimated Creatinine Clearance: 114.9 mL/min (by C-G formula based on SCr of 0.76 mg/dL). Liver Function Tests: No results for input(s): AST, ALT, ALKPHOS, BILITOT, PROT, ALBUMIN in the last 168 hours. No results for input(s): LIPASE, AMYLASE in the last 168 hours. No results  for input(s): AMMONIA in the last 168 hours. Coagulation Profile: No results for input(s): INR, PROTIME in the last 168 hours. Cardiac Enzymes: No results for input(s): CKTOTAL, CKMB, CKMBINDEX, TROPONINI in the last 168 hours. BNP (last 3  results) No results for input(s): PROBNP in the last 8760 hours. HbA1C: No results for input(s): HGBA1C in the last 72 hours. CBG: No results for input(s): GLUCAP in the last 168 hours. Lipid Profile: No results for input(s): CHOL, HDL, LDLCALC, TRIG, CHOLHDL, LDLDIRECT in the last 72 hours. Thyroid Function Tests: No results for input(s): TSH, T4TOTAL, FREET4, T3FREE, THYROIDAB in the last 72 hours. Anemia Panel: No results for input(s): VITAMINB12, FOLATE, FERRITIN, TIBC, IRON, RETICCTPCT in the last 72 hours. Sepsis Labs: Recent Labs  Lab 12/12/23 1750 12/12/23 1856  PROCALCITON <0.10  --   LATICACIDVEN  --  1.8    Recent Results (from the past 240 hours)  Resp panel by RT-PCR (RSV, Flu A&B, Covid) Anterior Nasal Swab     Status: None   Collection Time: 12/12/23  8:07 PM   Specimen: Anterior Nasal Swab  Result Value Ref Range Status   SARS Coronavirus 2 by RT PCR NEGATIVE NEGATIVE Final    Comment: (NOTE) SARS-CoV-2 target nucleic acids are NOT DETECTED.  The SARS-CoV-2 RNA is generally detectable in upper respiratory specimens during the acute phase of infection. The lowest concentration of SARS-CoV-2 viral copies this assay can detect is 138 copies/mL. A negative result does not preclude SARS-Cov-2 infection and should not be used as the sole basis for treatment or other patient management decisions. A negative result may occur with  improper specimen collection/handling, submission of specimen other than nasopharyngeal swab, presence of viral mutation(s) within the areas targeted by this assay, and inadequate number of viral copies(<138 copies/mL). A negative result must be combined with clinical observations, patient history, and epidemiological information. The expected result is Negative.  Fact Sheet for Patients:  BloggerCourse.com  Fact Sheet for Healthcare Providers:   SeriousBroker.it  This test is no t yet approved or cleared by the [place]  FDA and  has been authorized for detection and/or diagnosis of SARS-CoV-2 by FDA under an Emergency Use Authorization (EUA). This EUA will remain  in effect (meaning this test can be used) for the duration of the COVID-19 declaration under Section 564(b)(1) of the Act, 21 U.S.C.section 360bbb-3(b)(1), unless the authorization is terminated  or revoked sooner.       Influenza A by PCR NEGATIVE NEGATIVE Final   Influenza B by PCR NEGATIVE NEGATIVE Final    Comment: (NOTE) The Xpert Xpress SARS-CoV-2/FLU/RSV plus assay is intended as an aid in the diagnosis of influenza from Nasopharyngeal swab specimens and should not be used as a sole basis for treatment. Nasal washings and aspirates are unacceptable for Xpert Xpress SARS-CoV-2/FLU/RSV testing.  Fact Sheet for Patients: BloggerCourse.com  Fact Sheet for Healthcare Providers: SeriousBroker.it  This test is not yet approved or cleared by the [place]  FDA and has been authorized for detection and/or diagnosis of SARS-CoV-2 by FDA under an Emergency Use Authorization (EUA). This EUA will remain in effect (meaning this test can be used) for the duration of the COVID-19 declaration under Section 564(b)(1) of the Act, 21 U.S.C. section 360bbb-3(b)(1), unless the authorization is terminated or revoked.     Resp Syncytial Virus by PCR NEGATIVE NEGATIVE Final    Comment: (NOTE) Fact Sheet for Patients: BloggerCourse.com  Fact Sheet for Healthcare Providers: SeriousBroker.it  This test is not yet approved  or cleared by the [place]  FDA and has been authorized for detection and/or diagnosis of SARS-CoV-2 by FDA under an Emergency Use Authorization (EUA). This EUA will remain in effect (meaning this test can be used) for  the duration of the COVID-19 declaration under Section 564(b)(1) of the Act, 21 U.S.C. section 360bbb-3(b)(1), unless the authorization is terminated or revoked.  Performed at [facility], [address] [place], [place] [address]          Radiology Studies: [facility] Chest Port 1 View Result Date: 12/12/2023 CLINICAL DATA:  Shortness of breath.  Chest pain. EXAM: PORTABLE CHEST 1 VIEW COMPARISON:  Radiograph 05/05/2014 FINDINGS: Lung volumes are low. Stable heart size and mediastinal contours. Streaky bibasilar opacities. Bronchovascular crowding related to low lung volumes. Elevated left hemidiaphragm with associated subjacent gaseous gastric distension. No pneumothorax or large pleural effusion. IMPRESSION: 1. Low lung volumes with streaky bibasilar opacities, favor atelectasis. 2. Elevated left hemidiaphragm with subjacent gaseous gastric distension. Electronically Signed   By: Andrea Gasman M.D.   On: 12/12/2023 18:29        Scheduled Meds:  azithromycin  500 mg Oral Daily   dextromethorphan-guaiFENesin  1 tablet Oral BID   enoxaparin  (LOVENOX ) injection  50 mg Subcutaneous Q24H   fluticasone furoate-vilanterol  1 puff Inhalation Daily   ipratropium-albuterol  3 mL Nebulization TID   predniSONE  40 mg Oral Q breakfast   Continuous Infusions:        Sophie Mao, MD [facility] 12/13/2023, 7:32 AM

## 2023-12-13 NOTE — ED Notes (Signed)
 Report given to Amil Amen, [place]

## 2023-12-13 NOTE — ED Notes (Signed)
 Breakfast tray given to patient.

## 2023-12-13 NOTE — ED Notes (Signed)
 Pt provided with ice pack, per pt request.

## 2023-12-14 DIAGNOSIS — J9621 Acute and chronic respiratory failure with hypoxia: Secondary | ICD-10-CM | POA: Diagnosis not present

## 2023-12-14 LAB — BLOOD CULTURE ID PANEL (REFLEXED) - BCID2

## 2023-12-14 LAB — CBC WITH DIFFERENTIAL/PLATELET
Abs Immature Granulocytes: 0.07 K/uL (ref 0.00–0.07)
Basophils Absolute: 0 K/uL (ref 0.0–0.1)
Basophils Relative: 0 %
Eosinophils Absolute: 0 K/uL (ref 0.0–0.5)
Eosinophils Relative: 0 %
HCT: 46.9 % (ref 39.0–52.0)
Hemoglobin: 14.9 g/dL (ref 13.0–17.0)
Immature Granulocytes: 1 %
Lymphocytes Relative: 15 %
Lymphs Abs: 1.9 K/uL (ref 0.7–4.0)
MCH: 29.8 pg (ref 26.0–34.0)
MCHC: 31.8 g/dL (ref 30.0–36.0)
MCV: 93.8 fL (ref 80.0–100.0)
Monocytes Absolute: 1.3 K/uL — ABNORMAL HIGH (ref 0.1–1.0)
Monocytes Relative: 10 %
Neutro Abs: 9.3 K/uL — ABNORMAL HIGH (ref 1.7–7.7)
Neutrophils Relative %: 74 %
Platelets: 241 K/uL (ref 150–400)
RBC: 5 MIL/uL (ref 4.22–5.81)
RDW: 13.4 % (ref 11.5–15.5)
WBC: 12.5 K/uL — ABNORMAL HIGH (ref 4.0–10.5)
nRBC: 0 % (ref 0.0–0.2)

## 2023-12-14 LAB — BASIC METABOLIC PANEL WITH GFR
Anion gap: 9 (ref 5–15)
BUN: 19 mg/dL (ref 6–20)
CO2: 31 mmol/L (ref 22–32)
Calcium: 9.2 mg/dL (ref 8.9–10.3)
Chloride: 99 mmol/L (ref 98–111)
Creatinine, Ser: 0.83 mg/dL (ref 0.61–1.24)
GFR, Estimated: >60 mL/min (ref >60)
Glucose, Bld: 140 mg/dL — ABNORMAL HIGH (ref 70–99)
Potassium: 4.3 mmol/L (ref 3.5–5.1)
Sodium: 138 mmol/L (ref 135–145)

## 2023-12-14 LAB — MAGNESIUM: Magnesium: 2.3 mg/dL (ref 1.7–2.4)

## 2023-12-14 LAB — CULTURE, BLOOD (ROUTINE X 2)

## 2023-12-14 MED ORDER — HYDROXYZINE HCL 25 MG PO TABS
25.0000 mg | ORAL_TABLET | Freq: Once | ORAL | Status: AC
Start: 2023-12-15 — End: 2023-12-14
  Administered 2023-12-14: 25 mg via ORAL
  Filled 2023-12-14: qty 1

## 2023-12-14 MED ORDER — OXYCODONE HCL 5 MG PO TABS
20.0000 mg | ORAL_TABLET | ORAL | Status: DC | PRN
  Administered 2023-12-14 – 2023-12-15 (×6): 20 mg via ORAL
  Filled 2023-12-14 (×6): qty 4

## 2023-12-14 MED ORDER — ALUM & MAG HYDROXIDE-SIMETH 200-200-20 MG/5ML PO SUSP
15.0000 mL | Freq: Four times a day (QID) | ORAL | Status: DC | PRN
  Administered 2023-12-14 – 2023-12-16 (×2): 15 mL via ORAL
  Filled 2023-12-14 (×2): qty 30

## 2023-12-14 MED ORDER — BUDESONIDE 0.5 MG/2ML IN SUSP
0.5000 mg | Freq: Two times a day (BID) | RESPIRATORY_TRACT | Status: DC
  Administered 2023-12-14 – 2023-12-19 (×10): 0.5 mg via RESPIRATORY_TRACT
  Filled 2023-12-14 (×11): qty 2

## 2023-12-14 MED ORDER — HYDROMORPHONE HCL 1 MG/ML IJ SOLN
2.0000 mg | INTRAMUSCULAR | Status: DC | PRN
  Administered 2023-12-14 – 2023-12-15 (×10): 2 mg via INTRAVENOUS
  Filled 2023-12-14 (×9): qty 2

## 2023-12-14 MED ORDER — ARFORMOTEROL TARTRATE 15 MCG/2ML IN NEBU
15.0000 ug | INHALATION_SOLUTION | Freq: Two times a day (BID) | RESPIRATORY_TRACT | Status: DC
  Administered 2023-12-14 – 2023-12-19 (×10): 15 ug via RESPIRATORY_TRACT
  Filled 2023-12-14 (×11): qty 2

## 2023-12-14 NOTE — Progress Notes (Signed)
 PHARMACY - PHYSICIAN COMMUNICATION CRITICAL VALUE ALERT - BLOOD CULTURE IDENTIFICATION (BCID)  Blake Lara is an 43 y.o. male who presented to [facility] on 12/12/2023 with COPD exacerbation  Assessment:  BCID + Staph epidermidis (methicillin resistant) in 1 out of 4 bottles.  WBC wnl and pt afebrile.     Name of physician (or Provider) Contacted: Lynwood Kipper, NP  Current antibiotics: Azithromycin  Changes to prescribed antibiotics recommended:  Patient is on recommended antibiotics - No changes needed  Results for orders placed or performed during the hospital encounter of 12/12/23  Blood Culture ID Panel (Reflexed) (Collected: 12/12/2023  6:20 PM)  Result Value Ref Range   Enterococcus faecalis NOT DETECTED NOT DETECTED   Enterococcus Faecium NOT DETECTED NOT DETECTED   Listeria monocytogenes NOT DETECTED NOT DETECTED   Staphylococcus species DETECTED (A) NOT DETECTED   Staphylococcus aureus (BCID) NOT DETECTED NOT DETECTED   Staphylococcus epidermidis DETECTED (A) NOT DETECTED   Staphylococcus lugdunensis NOT DETECTED NOT DETECTED   Streptococcus species NOT DETECTED NOT DETECTED   Streptococcus agalactiae NOT DETECTED NOT DETECTED   Streptococcus pneumoniae NOT DETECTED NOT DETECTED   Streptococcus pyogenes NOT DETECTED NOT DETECTED   A.calcoaceticus-baumannii NOT DETECTED NOT DETECTED   Bacteroides fragilis NOT DETECTED NOT DETECTED   Enterobacterales NOT DETECTED NOT DETECTED   Enterobacter cloacae complex NOT DETECTED NOT DETECTED   Escherichia coli NOT DETECTED NOT DETECTED   Klebsiella aerogenes NOT DETECTED NOT DETECTED   Klebsiella oxytoca NOT DETECTED NOT DETECTED   Klebsiella pneumoniae NOT DETECTED NOT DETECTED   Proteus species NOT DETECTED NOT DETECTED   Salmonella species NOT DETECTED NOT DETECTED   Serratia marcescens NOT DETECTED NOT DETECTED   Haemophilus influenzae NOT DETECTED NOT DETECTED   Neisseria meningitidis NOT DETECTED NOT DETECTED   Pseudomonas  aeruginosa NOT DETECTED NOT DETECTED   Stenotrophomonas maltophilia NOT DETECTED NOT DETECTED   Candida albicans NOT DETECTED NOT DETECTED   Candida auris NOT DETECTED NOT DETECTED   Candida glabrata NOT DETECTED NOT DETECTED   Candida krusei NOT DETECTED NOT DETECTED   Candida parapsilosis NOT DETECTED NOT DETECTED   Candida tropicalis NOT DETECTED NOT DETECTED   Cryptococcus neoformans/gattii NOT DETECTED NOT DETECTED   Methicillin resistance mecA/C DETECTED (A) NOT DETECTED    Arvin Gauss, PharmD 12/14/2023  5:48 AM

## 2023-12-14 NOTE — Progress Notes (Signed)
 PROGRESS NOTE    Blake Lara  FMW:[id number] DOB: [birth year] DOA: 12/12/2023 PCP: Shelda Atlas, MD    Brief Narrative:   Blake Lara is a 43 y.o. 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   Patient reports that last night, he started having shortness of breath and was found to have SpO2 in the 70s to 80s requiring an increase in his home O2 to 4 L [place].  He also reports associated wheezing, cough with clear sputum and chest discomfort with the constant coughing but denies any fevers, chills, headache, dizziness, chest pain or change in ileostomy output. Patient endorses chronic abdominal pain from his multiple abdominal surgeries currently on daily oxycodone  and oxymorphone.    In the ED, afebrile, RR 16-24, HR 90-100s, SBP 110-170s, SpO2 96% on 4 L [place]. Initial labs significant for WBC of 11 otherwise normal renal function and lactic acid. EKG shows sinus rhythm. CXR shows low lung volumes with streaky bibasilar opacities, favor atelectasis. Pt received IV morphine , IV Dilaudid , IV Solu-Medrol, DuoNebs x 3, IV doxycycline  and IV Levaquin .  TRH was consulted for admission.   Assessment & Plan:   Acute on chronic hypoxic respiratory failure COPD exacerbation Patient presenting with progressive shortness of breath, cough and wheezing.  At baseline on 2 L nasal cannula nocturnally.  Patient is afebrile, slightly elevated WC count of 11.  Chest x-ray with low lung volumes, streaky bibasilar opacities that favors atelectasis.  COVID, RSV, influenza PCR negative.  Procalcitonin within normal limits.  Complicated  by continued tobacco use disorder. -- Respiratory viral panel, MRSA PCR, sputum culture ordered and pending -- Azithromycin 500 mg p.o. daily -- Pulmicort neb twice daily -- Brovana neb twice daily -- DuoNeb every 6 hours -- Albuterol neb every 2 hours.  Wheezing/shortness of breath -- Solu-Medrol 80 mg IV every 24 hours -- Continue supplemental oxygen to maintain SpO2 greater than 88%, at baseline on 2 L nocturnally Incentive spirometry, flutter valve--   Spina bifida Multiple hernias s/p multiple abdominal surgeries Chronic abdominal pain S/p ileostomy Stable -- Continue scheduled home oxycodone  20mg  PO q4h -- Dilaudid  2mg  IV q2h PRN breakthrough pain   Tobacco use disorder Reports smoking half a pack of cigarettes per day, previously quit for 10 years and resumed back in 2015 -- Counseled on need for complete cessation/abstinence   Morbid obesity Body mass index is 44.03 kg/m.    DVT prophylaxis: Lovenox    Code Status: Full Code Family Communication: Updated spouse present at bedside this morning  Disposition Plan:  Level of care: Telemetry Status is: Inpatient Remains inpatient appropriate because: IV steroids, weaning from oxygen    Consultants:  None  Procedures:  None  Antimicrobials:  Azithromycin   Subjective: Patient seen examined bedside, lying in bed.  Complaining of pain.  Wants his home pain medication regimen reordered.  Continues with dyspnea, currently remains on supplemental oxygen, SpO2 95% and currently on 3 L nasal cannula.  Continues with nonproductive cough.  Discussed needs complete tobacco abstinence/cessation.  No other questions or concerns at this time.  Denies headache, no dizziness, no chest pain, no palpitations, no nausea/vomiting/diarrhea, no fever/chills/night sweats.  No acute events overnight per nursing staff.  Objective: Vitals:   12/14/23 0745 12/14/23 1448 12/14/23 1452 12/14/23 1453  BP:      Pulse:      Resp:       Temp:      TempSrc:      SpO2: 95% 91% 91% 90%  Weight:      Height:        Intake/Output Summary (Last 24 hours) at 12/14/2023 1727 Last data filed at 12/14/2023 0626 Gross per 24 hour  Intake 480 ml  Output --  Net 480 ml   Filed Weights   12/12/23 1731  Weight: 98.9 kg    Examination:  Physical Exam: GEN: NAD, alert and oriented x 3, obese HEENT: NCAT, PERRL, EOMI, sclera clear, MMM PULM: Wheezing noted bilateral upper lung fields, no crackles, normal respiratory effort without accessory muscle use, on 3 L [place], with SpO2 95% at rest  CV: RRR w/o M/G/R GI: abd soft, NTND, NABS, no R/G/M MSK: no peripheral edema   Data Reviewed: I have personally reviewed following labs and imaging studies  CBC: Recent Labs  Lab 12/12/23 1750 12/13/23 0500 12/14/23 0539  WBC 11.0* 8.1 12.5*  NEUTROABS  --   --  9.3*  HGB 15.5 14.9 14.9  HCT 48.4 45.8 46.9  MCV 91.0 90.9 93.8  PLT 259 243 241   Basic Metabolic Panel: Recent Labs  Lab 12/12/23 1750 12/13/23 0500 12/14/23 0539  NA 137 139 138  K 4.4 4.7 4.3  CL 99 100 99  CO2 27 29 31   GLUCOSE 118* 159* 140*  BUN 10 12 19   CREATININE 0.76 0.76 0.83  CALCIUM 9.3 9.3 9.2  MG  --   --  2.3   GFR: Estimated Creatinine Clearance: 110.7 mL/min (by C-G formula based on SCr of 0.83 mg/dL). Liver Function Tests: Recent Labs  Lab 12/13/23 0500  AST 21  ALT 18  ALKPHOS 93  BILITOT 0.5  PROT 7.8  ALBUMIN 3.8   No results for input(s): LIPASE, AMYLASE in the last 168 hours. No results for input(s): AMMONIA in the last 168 hours. Coagulation Profile: No results for input(s): INR, PROTIME in the last 168 hours. Cardiac Enzymes: No results for input(s): CKTOTAL, CKMB, CKMBINDEX, TROPONINI in the last 168 hours. BNP (last 3 results) No results for input(s): PROBNP in the last 8760 hours. HbA1C: No results for input(s): HGBA1C in the last 72 hours. CBG: No results for input(s): GLUCAP in the  last 168 hours. Lipid Profile: No results for input(s): CHOL, HDL, LDLCALC, TRIG, CHOLHDL, LDLDIRECT in the last 72 hours. Thyroid Function Tests: No results for input(s): TSH, T4TOTAL, FREET4, T3FREE, THYROIDAB in the last 72 hours. Anemia Panel: No results for input(s): VITAMINB12, FOLATE, FERRITIN, TIBC, IRON, RETICCTPCT in the last 72 hours. Sepsis Labs: Recent Labs  Lab 12/12/23 1750 12/12/23 1856  PROCALCITON <0.10  --   LATICACIDVEN  --  1.8    Recent Results (from the past 240 hours)  Blood culture (routine x 2)     Status: None (Preliminary result)   Collection Time: 12/12/23  6:20 PM   Specimen: BLOOD  Result Value Ref Range Status   Specimen Description   Final    BLOOD RIGHT ANTECUBITAL IV DRAW Performed at [facility], [address] [place], [place] [address]    Special Requests   Final    BOTTLES DRAWN AEROBIC AND ANAEROBIC Blood Culture results may not be optimal due to an inadequate volume of blood received in culture bottles  Performed at [facility], [address], [place], [place] [address]    Culture  Setup Time   Final    GRAM POSITIVE COCCI IN CLUSTERS AEROBIC BOTTLE ONLY CRITICAL RESULT CALLED TO, READ BACK BY AND VERIFIED WITH: L POINTDEXTER  PHARMD 12/14/2023 0521 BY DD Performed at [facility], [address], [place], [place] [address]    Culture GRAM POSITIVE COCCI  Final   Report Status PENDING  Incomplete  Blood Culture ID Panel (Reflexed)     Status: Abnormal   Collection Time: 12/12/23  6:20 PM  Result Value Ref Range Status   Enterococcus faecalis NOT DETECTED NOT DETECTED Final   Enterococcus Faecium NOT DETECTED NOT DETECTED Final   Listeria monocytogenes NOT DETECTED NOT DETECTED Final   Staphylococcus species DETECTED (A) NOT DETECTED Final    Comment: CRITICAL RESULT CALLED TO, READ BACK BY AND VERIFIED WITH: L POINTDEXTER  PHARMD 12/14/2023 0521 BY  DD    Staphylococcus aureus (BCID) NOT DETECTED NOT DETECTED Final   Staphylococcus epidermidis DETECTED (A) NOT DETECTED Final    Comment: Methicillin (oxacillin) resistant coagulase negative staphylococcus. Possible blood culture contaminant (unless isolated from more than one blood culture draw or clinical case suggests pathogenicity). No antibiotic treatment is indicated for blood  culture contaminants. CRITICAL RESULT CALLED TO, READ BACK BY AND VERIFIED WITH: L POINTDEXTER  PHARMD 12/14/2023 0521 BY DD    Staphylococcus lugdunensis NOT DETECTED NOT DETECTED Final   Streptococcus species NOT DETECTED NOT DETECTED Final   Streptococcus agalactiae NOT DETECTED NOT DETECTED Final   Streptococcus pneumoniae NOT DETECTED NOT DETECTED Final   Streptococcus pyogenes NOT DETECTED NOT DETECTED Final   A.calcoaceticus-baumannii NOT DETECTED NOT DETECTED Final   Bacteroides fragilis NOT DETECTED NOT DETECTED Final   Enterobacterales NOT DETECTED NOT DETECTED Final   Enterobacter cloacae complex NOT DETECTED NOT DETECTED Final   Escherichia coli NOT DETECTED NOT DETECTED Final   Klebsiella aerogenes NOT DETECTED NOT DETECTED Final   Klebsiella oxytoca NOT DETECTED NOT DETECTED Final   Klebsiella pneumoniae NOT DETECTED NOT DETECTED Final   Proteus species NOT DETECTED NOT DETECTED Final   Salmonella species NOT DETECTED NOT DETECTED Final   Serratia marcescens NOT DETECTED NOT DETECTED Final   Haemophilus influenzae NOT DETECTED NOT DETECTED Final   Neisseria meningitidis NOT DETECTED NOT DETECTED Final   Pseudomonas aeruginosa NOT DETECTED NOT DETECTED Final   Stenotrophomonas maltophilia NOT DETECTED NOT DETECTED Final   Candida albicans NOT DETECTED NOT DETECTED Final   Candida auris NOT DETECTED NOT DETECTED Final   Candida glabrata NOT DETECTED NOT DETECTED Final   Candida krusei NOT DETECTED NOT DETECTED Final   Candida parapsilosis NOT DETECTED NOT DETECTED Final   Candida  tropicalis NOT DETECTED NOT DETECTED Final   Cryptococcus neoformans/gattii NOT DETECTED NOT DETECTED Final   Methicillin resistance mecA/C DETECTED (A) NOT DETECTED Final    Comment: CRITICAL RESULT CALLED TO, READ BACK BY AND VERIFIED WITH: L POINTDEXTER  PHARMD 12/14/2023 0521 BY DD Performed at [facility], [address], [place], [place] [address]   Resp panel by RT-PCR (RSV, Flu A&B, Covid) Anterior Nasal Swab     Status: None   Collection Time: 12/12/23  8:07 PM   Specimen: Anterior Nasal Swab  Result Value Ref Range Status   SARS Coronavirus 2 by RT PCR NEGATIVE NEGATIVE Final    Comment: (NOTE) SARS-CoV-2 target nucleic acids are NOT DETECTED.  The SARS-CoV-2 RNA is generally detectable  in upper respiratory specimens during the acute phase of infection. The lowest concentration of SARS-CoV-2 viral copies this assay can detect is 138 copies/mL. A negative result does not preclude SARS-Cov-2 infection and should not be used as the sole basis for treatment or other patient management decisions. A negative result may occur with  improper specimen collection/handling, submission of specimen other than nasopharyngeal swab, presence of viral mutation(s) within the areas targeted by this assay, and inadequate number of viral copies(<138 copies/mL). A negative result must be combined with clinical observations, patient history, and epidemiological information. The expected result is Negative.  Fact Sheet for Patients:  BloggerCourse.com  Fact Sheet for Healthcare Providers:  SeriousBroker.it  This test is no t yet approved or cleared by the [place]  FDA and  has been authorized for detection and/or diagnosis of SARS-CoV-2 by FDA under an Emergency Use Authorization (EUA). This EUA will remain  in effect (meaning this test can be used) for the duration of the COVID-19 declaration under Section 564(b)(1) of the Act,  21 U.S.C.section 360bbb-3(b)(1), unless the authorization is terminated  or revoked sooner.       Influenza A by PCR NEGATIVE NEGATIVE Final   Influenza B by PCR NEGATIVE NEGATIVE Final    Comment: (NOTE) The Xpert Xpress SARS-CoV-2/FLU/RSV plus assay is intended as an aid in the diagnosis of influenza from Nasopharyngeal swab specimens and should not be used as a sole basis for treatment. Nasal washings and aspirates are unacceptable for Xpert Xpress SARS-CoV-2/FLU/RSV testing.  Fact Sheet for Patients: BloggerCourse.com  Fact Sheet for Healthcare Providers: SeriousBroker.it  This test is not yet approved or cleared by the [place]  FDA and has been authorized for detection and/or diagnosis of SARS-CoV-2 by FDA under an Emergency Use Authorization (EUA). This EUA will remain in effect (meaning this test can be used) for the duration of the COVID-19 declaration under Section 564(b)(1) of the Act, 21 U.S.C. section 360bbb-3(b)(1), unless the authorization is terminated or revoked.     Resp Syncytial Virus by PCR NEGATIVE NEGATIVE Final    Comment: (NOTE) Fact Sheet for Patients: BloggerCourse.com  Fact Sheet for Healthcare Providers: SeriousBroker.it  This test is not yet approved or cleared by the [place]  FDA and has been authorized for detection and/or diagnosis of SARS-CoV-2 by FDA under an Emergency Use Authorization (EUA). This EUA will remain in effect (meaning this test can be used) for the duration of the COVID-19 declaration under Section 564(b)(1) of the Act, 21 U.S.C. section 360bbb-3(b)(1), unless the authorization is terminated or revoked.  Performed at [facility], [address], [place], [place] [address]   Blood culture (routine x 2)     Status: None (Preliminary result)   Collection Time: 12/13/23  5:52 PM   Specimen: BLOOD  LEFT HAND  Result Value Ref Range Status   Specimen Description   Final    BLOOD LEFT HAND Performed at [facility], [address] [place], [place] [address]    Special Requests   Final    BOTTLES DRAWN AEROBIC AND ANAEROBIC Blood Culture adequate volume Performed at [facility], [address], [place], [place] [address]    Culture  Setup Time PENDING  Incomplete   Culture   Final    NO GROWTH < 12 HOURS Performed at [facility], [address], [place], [place] [address]    Report Status PENDING  Incomplete         Radiology  Studies: DG Chest Port 1 View Result Date: 12/12/2023 CLINICAL DATA:  Shortness of breath.  Chest pain. EXAM: PORTABLE CHEST 1 VIEW COMPARISON:  Radiograph 05/05/2014 FINDINGS: Lung volumes are low. Stable heart size and mediastinal contours. Streaky bibasilar opacities. Bronchovascular crowding related to low lung volumes. Elevated left hemidiaphragm with associated subjacent gaseous gastric distension. No pneumothorax or large pleural effusion. IMPRESSION: 1. Low lung volumes with streaky bibasilar opacities, favor atelectasis. 2. Elevated left hemidiaphragm with subjacent gaseous gastric distension. Electronically Signed   By: Andrea Gasman M.D.   On: 12/12/2023 18:29        Scheduled Meds:  arformoterol  15 mcg Nebulization BID   azithromycin  500 mg Oral Daily   budesonide (PULMICORT) nebulizer solution  0.5 mg Nebulization BID   dextromethorphan-guaiFENesin  1 tablet Oral BID   enoxaparin  (LOVENOX ) injection  50 mg Subcutaneous Q24H   ipratropium-albuterol  3 mL Nebulization Q6H   methylPREDNISolone (SOLU-MEDROL) injection  80 mg Intravenous Q24H   Continuous Infusions:   LOS: 2 days    Time spent: 52 minutes spent on 12/14/2023 caring for this patient face-to-face including chart review, ordering labs/tests, documenting, discussion with nursing staff, consultants, updating family and interview/physical  exam    Camellia PARAS [place], DO [facility] Available via Epic secure chat 7am-7pm After these hours, please refer to coverage provider listed on [email or web address] 12/14/2023, 5:27 PM

## 2023-12-14 NOTE — Plan of Care (Signed)

## 2023-12-15 ENCOUNTER — Inpatient Hospital Stay (HOSPITAL_COMMUNITY)

## 2023-12-15 DIAGNOSIS — J9621 Acute and chronic respiratory failure with hypoxia: Secondary | ICD-10-CM | POA: Diagnosis not present

## 2023-12-15 MED ORDER — HYDROCODONE BIT-HOMATROP MBR 5-1.5 MG/5ML PO SOLN: 5.0000 mL | Freq: Four times a day (QID) | ORAL | Status: DC | PRN

## 2023-12-15 MED ORDER — HYDROMORPHONE HCL 2 MG PO TABS: 2.0000 mg | ORAL_TABLET | ORAL | Status: DC | PRN

## 2023-12-15 MED ORDER — HYDROMORPHONE HCL 1 MG/ML IJ SOLN: 2.0000 mg | INTRAMUSCULAR | Status: DC | PRN

## 2023-12-15 MED ORDER — OXYCODONE HCL 5 MG PO TABS
20.0000 mg | ORAL_TABLET | ORAL | Status: DC | PRN
  Administered 2023-12-15 – 2023-12-19 (×21): 20 mg via ORAL
  Filled 2023-12-15 (×20): qty 4

## 2023-12-15 MED ORDER — HYDROMORPHONE HCL 2 MG PO TABS
2.0000 mg | ORAL_TABLET | ORAL | Status: DC | PRN
  Administered 2023-12-15 – 2023-12-16 (×2): 2 mg via ORAL
  Filled 2023-12-15 (×5): qty 1

## 2023-12-15 MED ORDER — HYDROMORPHONE HCL 1 MG/ML IJ SOLN
2.0000 mg | INTRAMUSCULAR | Status: DC | PRN
  Administered 2023-12-15 – 2023-12-19 (×39): 2 mg via INTRAVENOUS
  Filled 2023-12-15 (×39): qty 2

## 2023-12-15 NOTE — Progress Notes (Signed)
 PROGRESS NOTE    Blake Lara  FMW:[id number] DOB: 1980-11-28 DOA: 12/12/2023 PCP: Shelda Atlas, MD    Brief Narrative:   Blake Lara is a 43 y.o. 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   Patient reports that last night, he started having shortness of breath and was found to have SpO2 in the 70s to 80s requiring an increase in his home O2 to 4 L [place].  He also reports associated wheezing, cough with clear sputum and chest discomfort with the constant coughing but denies any fevers, chills, headache, dizziness, chest pain or change in ileostomy output. Patient endorses chronic abdominal pain from his multiple abdominal surgeries currently on daily oxycodone  and oxymorphone.    In the ED, afebrile, RR 16-24, HR 90-100s, SBP 110-170s, SpO2 96% on 4 L [place]. Initial labs significant for WBC of 11 otherwise normal renal function and lactic acid. EKG shows sinus rhythm. CXR shows low lung volumes with streaky bibasilar opacities, favor atelectasis. Pt received IV morphine , IV Dilaudid , IV Solu-Medrol, DuoNebs x 3, IV doxycycline  and IV Levaquin .  TRH was consulted for admission.   Assessment & Plan:   Acute on chronic hypoxic respiratory failure COPD exacerbation Patient presenting with progressive shortness of breath, cough and wheezing.  At baseline on 2 L nasal cannula nocturnally.  Patient is afebrile, slightly elevated WC count of 11.  Chest x-ray with low lung volumes, streaky bibasilar opacities that favors atelectasis.  COVID, RSV, influenza PCR negative.  Procalcitonin within normal limits.  Complicated  by continued tobacco use disorder. -- Azithromycin 500 mg p.o. daily -- Pulmicort neb twice daily -- Brovana neb twice daily -- DuoNeb every 6 hours -- Albuterol neb q2h PRN wheezing/shortness of breath -- Solu-Medrol 80 mg IV every 24 hours -- Hycodan cough syrup every 6 hours as needed cough -- Continue supplemental oxygen to maintain SpO2 greater than 88%, at baseline on 2 L nocturnally -- Incentive spirometry, flutter valve  Spina bifida Multiple hernias s/p multiple abdominal surgeries Chronic abdominal pain S/p ileostomy Stable -- Continue scheduled home oxycodone  20mg  PO q4h -- Dilaudid  2mg  IV q2h PRN breakthrough pain   Tobacco use disorder Reports smoking half a pack of cigarettes per day, previously quit for 10 years and resumed back in 2015 -- Counseled on need for complete cessation/abstinence   Morbid obesity Body mass index is 44.03 kg/m.    DVT prophylaxis: Lovenox    Code Status: Full Code Family Communication: Updated spouse present at bedside this morning  Disposition Plan:  Level of care: Telemetry Status is: Inpatient Remains inpatient appropriate because: IV steroids, weaning from oxygen    Consultants:  None  Procedures:  None  Antimicrobials:  Azithromycin   Subjective: Patient seen examined bedside, lying in bed.  Continues to complain of shortness of breath, cough, generalized pain. No other questions or concerns at this time.  Denies headache, no dizziness, no chest pain, no palpitations, no nausea/vomiting/diarrhea, no fever/chills/night sweats.  No acute events overnight per nursing staff.  Objective: Vitals:   12/14/23 2056 12/14/23 2153 12/15/23 0642 12/15/23 1000  BP:  (!) 144/93 120/70   Pulse:  89 73   Resp:  16 20   Temp:  98.4 F (36.9 C) 98 F (36.7 C)   TempSrc:  Oral Oral   SpO2: 92% 93% 95% 95%  Weight:      Height:        Intake/Output Summary (Last 24 hours) at 12/15/2023 1303 Last data filed at 12/15/2023  1227 Gross per 24 hour  Intake 480 ml  Output 400 ml  Net 80 ml   Filed Weights   12/12/23 1731  Weight: 98.9 kg    Examination:  Physical Exam: GEN: NAD, alert and oriented x 3, obese HEENT: NCAT, PERRL, EOMI, sclera clear, MMM PULM: Wheezing noted bilateral upper lung fields, no crackles, normal respiratory effort without accessory muscle use, on 3 L [place] with SpO2 95% at rest  CV: RRR w/o M/G/R GI: abd soft, NTND, NABS, no R/G/M MSK: no peripheral edema   Data Reviewed: I have personally reviewed following labs and imaging studies  CBC: Recent Labs  Lab 12/12/23 1750 12/13/23 0500 12/14/23 0539  WBC 11.0* 8.1 12.5*  NEUTROABS  --   --  9.3*  HGB 15.5 14.9 14.9  HCT 48.4 45.8 46.9  MCV 91.0 90.9 93.8  PLT 259 243 241   Basic Metabolic Panel: Recent Labs  Lab 12/12/23 1750 12/13/23 0500 12/14/23 0539  NA 137 139 138  K 4.4 4.7 4.3  CL 99 100 99  CO2 27 29 31   GLUCOSE 118* 159* 140*  BUN 10 12 19   CREATININE 0.76 0.76 0.83  CALCIUM 9.3 9.3 9.2  MG  --   --  2.3   GFR: Estimated Creatinine Clearance: 110.7 mL/min (by C-G formula based on SCr of 0.83 mg/dL). Liver Function Tests: Recent Labs  Lab 12/13/23 0500  AST 21  ALT 18  ALKPHOS 93  BILITOT 0.5  PROT 7.8  ALBUMIN 3.8   No results for input(s): LIPASE, AMYLASE in the last 168 hours. No results for input(s): AMMONIA in the last 168 hours. Coagulation Profile: No results for input(s): INR, PROTIME in the last 168 hours. Cardiac Enzymes: No results for input(s): CKTOTAL, CKMB, CKMBINDEX, TROPONINI in the last 168 hours. BNP (last 3 results) No results for input(s): PROBNP in the last 8760 hours. HbA1C: No results for input(s): HGBA1C in the last 72 hours. CBG: No results for input(s): GLUCAP in the last 168 hours. Lipid Profile: No results for input(s): CHOL, HDL, LDLCALC, TRIG, CHOLHDL, LDLDIRECT in the last 72 hours. Thyroid Function Tests: No results  for input(s): TSH, T4TOTAL, FREET4, T3FREE, THYROIDAB in the last 72 hours. Anemia Panel: No results for input(s): VITAMINB12, FOLATE, FERRITIN, TIBC, IRON, RETICCTPCT in the last 72 hours. Sepsis Labs: Recent Labs  Lab 12/12/23 1750 12/12/23 1856  PROCALCITON <0.10  --   LATICACIDVEN  --  1.8    Recent Results (from the past 240 hours)  Blood culture (routine x 2)     Status: Abnormal (Preliminary result)   Collection Time: 12/12/23  6:20 PM   Specimen: BLOOD  Result Value Ref Range Status   Specimen Description   Final    BLOOD RIGHT ANTECUBITAL IV DRAW Performed at [facility], [address], [place], [place] [address]    Special Requests   Final    BOTTLES DRAWN AEROBIC AND ANAEROBIC Blood Culture results may not be optimal due to an inadequate volume of blood received in culture bottles Performed at [facility], [address] [place], [place] [address]    Culture  Setup Time   Final  GRAM POSITIVE COCCI IN CLUSTERS AEROBIC BOTTLE ONLY CRITICAL RESULT CALLED TO, READ BACK BY AND VERIFIED WITH: L POINTDEXTER  PHARMD 12/14/2023 0521 BY DD    Culture (A)  Final    STAPHYLOCOCCUS EPIDERMIDIS THE SIGNIFICANCE OF ISOLATING THIS ORGANISM FROM A SINGLE VENIPUNCTURE CANNOT BE PREDICTED WITHOUT FURTHER CLINICAL AND CULTURE CORRELATION. SUSCEPTIBILITIES AVAILABLE ONLY ON REQUEST. Performed at [facility], [address] [place], [place] [address]    Report Status PENDING  Incomplete  Blood Culture ID Panel (Reflexed)     Status: Abnormal   Collection Time: 12/12/23  6:20 PM  Result Value Ref Range Status   Enterococcus faecalis NOT DETECTED NOT DETECTED Final   Enterococcus Faecium NOT DETECTED NOT DETECTED Final   Listeria monocytogenes NOT DETECTED NOT DETECTED Final   Staphylococcus species DETECTED (A) NOT DETECTED Final    Comment: CRITICAL RESULT CALLED TO, READ BACK BY AND VERIFIED WITH: L  POINTDEXTER  PHARMD 12/14/2023 0521 BY DD    Staphylococcus aureus (BCID) NOT DETECTED NOT DETECTED Final   Staphylococcus epidermidis DETECTED (A) NOT DETECTED Final    Comment: Methicillin (oxacillin) resistant coagulase negative staphylococcus. Possible blood culture contaminant (unless isolated from more than one blood culture draw or clinical case suggests pathogenicity). No antibiotic treatment is indicated for blood  culture contaminants. CRITICAL RESULT CALLED TO, READ BACK BY AND VERIFIED WITH: L POINTDEXTER  PHARMD 12/14/2023 0521 BY DD    Staphylococcus lugdunensis NOT DETECTED NOT DETECTED Final   Streptococcus species NOT DETECTED NOT DETECTED Final   Streptococcus agalactiae NOT DETECTED NOT DETECTED Final   Streptococcus pneumoniae NOT DETECTED NOT DETECTED Final   Streptococcus pyogenes NOT DETECTED NOT DETECTED Final   A.calcoaceticus-baumannii NOT DETECTED NOT DETECTED Final   Bacteroides fragilis NOT DETECTED NOT DETECTED Final   Enterobacterales NOT DETECTED NOT DETECTED Final   Enterobacter cloacae complex NOT DETECTED NOT DETECTED Final   Escherichia coli NOT DETECTED NOT DETECTED Final   Klebsiella aerogenes NOT DETECTED NOT DETECTED Final   Klebsiella oxytoca NOT DETECTED NOT DETECTED Final   Klebsiella pneumoniae NOT DETECTED NOT DETECTED Final   Proteus species NOT DETECTED NOT DETECTED Final   Salmonella species NOT DETECTED NOT DETECTED Final   Serratia marcescens NOT DETECTED NOT DETECTED Final   Haemophilus influenzae NOT DETECTED NOT DETECTED Final   Neisseria meningitidis NOT DETECTED NOT DETECTED Final   Pseudomonas aeruginosa NOT DETECTED NOT DETECTED Final   Stenotrophomonas maltophilia NOT DETECTED NOT DETECTED Final   Candida albicans NOT DETECTED NOT DETECTED Final   Candida auris NOT DETECTED NOT DETECTED Final   Candida glabrata NOT DETECTED NOT DETECTED Final   Candida krusei NOT DETECTED NOT DETECTED Final   Candida parapsilosis NOT DETECTED  NOT DETECTED Final   Candida tropicalis NOT DETECTED NOT DETECTED Final   Cryptococcus neoformans/gattii NOT DETECTED NOT DETECTED Final   Methicillin resistance mecA/C DETECTED (A) NOT DETECTED Final    Comment: CRITICAL RESULT CALLED TO, READ BACK BY AND VERIFIED WITH: L POINTDEXTER  PHARMD 12/14/2023 0521 BY DD Performed at [facility], [address], [place], [place] [address]   Resp panel by RT-PCR (RSV, Flu A&B, Covid) Anterior Nasal Swab     Status: None   Collection Time: 12/12/23  8:07 PM   Specimen: Anterior Nasal Swab  Result Value Ref Range Status   SARS Coronavirus 2 by RT PCR NEGATIVE NEGATIVE Final    Comment: (NOTE) SARS-CoV-2 target nucleic acids are NOT DETECTED.  The SARS-CoV-2 RNA is  generally detectable in upper respiratory specimens during the acute phase of infection. The lowest concentration of SARS-CoV-2 viral copies this assay can detect is 138 copies/mL. A negative result does not preclude SARS-Cov-2 infection and should not be used as the sole basis for treatment or other patient management decisions. A negative result may occur with  improper specimen collection/handling, submission of specimen other than nasopharyngeal swab, presence of viral mutation(s) within the areas targeted by this assay, and inadequate number of viral copies(<138 copies/mL). A negative result must be combined with clinical observations, patient history, and epidemiological information. The expected result is Negative.  Fact Sheet for Patients:  BloggerCourse.com  Fact Sheet for Healthcare Providers:  SeriousBroker.it  This test is no t yet approved or cleared by the [place]  FDA and  has been authorized for detection and/or diagnosis of SARS-CoV-2 by FDA under an Emergency Use Authorization (EUA). This EUA will remain  in effect (meaning this test can be used) for the duration of the COVID-19 declaration under  Section 564(b)(1) of the Act, 21 U.S.C.section 360bbb-3(b)(1), unless the authorization is terminated  or revoked sooner.       Influenza A by PCR NEGATIVE NEGATIVE Final   Influenza B by PCR NEGATIVE NEGATIVE Final    Comment: (NOTE) The Xpert Xpress SARS-CoV-2/FLU/RSV plus assay is intended as an aid in the diagnosis of influenza from Nasopharyngeal swab specimens and should not be used as a sole basis for treatment. Nasal washings and aspirates are unacceptable for Xpert Xpress SARS-CoV-2/FLU/RSV testing.  Fact Sheet for Patients: BloggerCourse.com  Fact Sheet for Healthcare Providers: SeriousBroker.it  This test is not yet approved or cleared by the [place]  FDA and has been authorized for detection and/or diagnosis of SARS-CoV-2 by FDA under an Emergency Use Authorization (EUA). This EUA will remain in effect (meaning this test can be used) for the duration of the COVID-19 declaration under Section 564(b)(1) of the Act, 21 U.S.C. section 360bbb-3(b)(1), unless the authorization is terminated or revoked.     Resp Syncytial Virus by PCR NEGATIVE NEGATIVE Final    Comment: (NOTE) Fact Sheet for Patients: BloggerCourse.com  Fact Sheet for Healthcare Providers: SeriousBroker.it  This test is not yet approved or cleared by the [place]  FDA and has been authorized for detection and/or diagnosis of SARS-CoV-2 by FDA under an Emergency Use Authorization (EUA). This EUA will remain in effect (meaning this test can be used) for the duration of the COVID-19 declaration under Section 564(b)(1) of the Act, 21 U.S.C. section 360bbb-3(b)(1), unless the authorization is terminated or revoked.  Performed at [facility], [address] [place], [place] [address]   Blood culture (routine x 2)     Status: None (Preliminary result)   Collection Time:  12/13/23  5:52 PM   Specimen: BLOOD LEFT HAND  Result Value Ref Range Status   Specimen Description   Final    BLOOD LEFT HAND Performed at [facility], [address], [place], [place] [address]    Special Requests   Final    BOTTLES DRAWN AEROBIC AND ANAEROBIC Blood Culture adequate volume Performed at [facility], [address] [place], [place] [address]    Culture   Final    NO GROWTH 2 DAYS Performed at [facility], [address], [place], [place] [address]    Report Status PENDING  Incomplete         Radiology Studies: No results found.  Scheduled Meds:  arformoterol  15 mcg Nebulization BID   azithromycin  500 mg Oral Daily   budesonide (PULMICORT) nebulizer solution  0.5 mg Nebulization BID   dextromethorphan-guaiFENesin  1 tablet Oral BID   enoxaparin  (LOVENOX ) injection  50 mg Subcutaneous Q24H   ipratropium-albuterol  3 mL Nebulization Q6H   methylPREDNISolone (SOLU-MEDROL) injection  80 mg Intravenous Q24H   Continuous Infusions:   LOS: 3 days    Time spent: 52 minutes spent on 12/15/2023 caring for this patient face-to-face including chart review, ordering labs/tests, documenting, discussion with nursing staff, consultants, updating family and interview/physical exam    Camellia PARAS [place], DO [facility] Available via Epic secure chat 7am-7pm After these hours, please refer to coverage provider listed on [email or web address] 12/15/2023, 1:03 PM

## 2023-12-16 DIAGNOSIS — J9621 Acute and chronic respiratory failure with hypoxia: Secondary | ICD-10-CM | POA: Diagnosis not present

## 2023-12-16 LAB — CULTURE, BLOOD (ROUTINE X 2)

## 2023-12-16 MED ORDER — LORAZEPAM 0.5 MG PO TABS
0.5000 mg | ORAL_TABLET | Freq: Four times a day (QID) | ORAL | Status: DC | PRN
  Administered 2023-12-16 – 2023-12-19 (×7): 1 mg via ORAL
  Filled 2023-12-16 (×6): qty 2

## 2023-12-16 MED ORDER — TRAZODONE HCL 50 MG PO TABS
50.0000 mg | ORAL_TABLET | Freq: Once | ORAL | Status: DC
  Filled 2023-12-16: qty 1

## 2023-12-16 NOTE — Progress Notes (Signed)
 PROGRESS NOTE    Blake Lara  FMW:[id number] DOB: 19-Nov-1980 DOA: 12/12/2023 PCP: Shelda Atlas, MD    Brief Narrative:   Blake Lara is a 43 y.o. 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   Patient reports that last night, he started having shortness of breath and was found to have SpO2 in the 70s to 80s requiring an increase in his home O2 to 4 L [place].  He also reports associated wheezing, cough with clear sputum and chest discomfort with the constant coughing but denies any fevers, chills, headache, dizziness, chest pain or change in ileostomy output. Patient endorses chronic abdominal pain from his multiple abdominal surgeries currently on daily oxycodone  and oxymorphone.    In the ED, afebrile, RR 16-24, HR 90-100s, SBP 110-170s, SpO2 96% on 4 L . Initial labs significant for WBC of 11 otherwise normal renal function and lactic acid. EKG shows sinus rhythm. CXR shows low lung volumes with streaky bibasilar opacities, favor atelectasis. Pt received IV morphine , IV Dilaudid , IV Solu-Medrol, DuoNebs x 3, IV doxycycline  and IV Levaquin .  TRH was consulted for admission.   Assessment & Plan:   Acute on chronic hypoxic respiratory failure COPD exacerbation Patient presenting with progressive shortness of breath, cough and wheezing.  At baseline on 2 L nasal cannula nocturnally.  Patient is afebrile, slightly elevated WC count of 11.  Chest x-ray with low lung volumes, streaky bibasilar opacities that favors atelectasis.  COVID, RSV, influenza PCR negative.  Procalcitonin within normal limits.  Complicated  by continued tobacco use disorder. -- Azithromycin 500 mg p.o. daily x 5 days -- Pulmicort neb twice daily -- Brovana neb twice daily -- DuoNeb every 6 hours -- Albuterol neb q2h PRN wheezing/shortness of breath -- Solu-Medrol 80 mg IV every 24 hours -- Hycodan cough syrup every 6 hours as needed cough -- Ativan as needed anxiety -- Continue supplemental oxygen to maintain SpO2 greater than 88%, at baseline on 2 L nocturnally -- Incentive spirometry, flutter valve  Anxiety -- Ativan 0.5-1 mg p.o. every 6 hours as needed anxiety  Spina bifida Multiple hernias s/p multiple abdominal surgeries Chronic abdominal pain S/p ileostomy Stable -- Continue scheduled home oxycodone  20mg  PO q4h -- Dilaudid  2mg  IV q2h PRN breakthrough pain   Tobacco use disorder Reports smoking half a pack of cigarettes per day, previously quit for 10 years and resumed back in 2015 -- Counseled on need for complete cessation/abstinence   Morbid obesity Body mass index is 44.03 kg/m.    DVT prophylaxis: Lovenox    Code Status: Full Code Family Communication: No family present at bedside this afternoon  Disposition Plan:  Level of care: Telemetry Status is: Inpatient Remains inpatient appropriate because: IV steroids, weaning from oxygen    Consultants:  None  Procedures:  None  Antimicrobials:  Azithromycin   Subjective: Patient seen examined bedside, lying in bed.  Reports not feeling well.  Continues with cough that is nonproductive.  Discussed with patient's just need to give time for medications to work.  Also will start Ativan for anxiety as likely contributing factor.   No other questions or concerns at this time.  Denies headache, no dizziness, no chest pain, no palpitations,  no nausea/vomiting/diarrhea, no fever/chills/night sweats.  No acute events overnight per nursing staff.  Objective: Vitals:   12/16/23 0642 12/16/23 0809 12/16/23 0811 12/16/23 0823  BP: 107/67     Pulse: 74      Resp: 18     Temp: 98.4 F (36.9 C)     TempSrc: Oral     SpO2: 94% 93% 93% 93%  Weight:      Height:        Intake/Output Summary (Last 24 hours) at 12/16/2023 1311 Last data filed at 12/16/2023 1128 Gross per 24 hour  Intake 240 ml  Output 450 ml  Net -210 ml   Filed Weights   12/12/23 1731  Weight: 98.9 kg    Examination:  Physical Exam: GEN: NAD, alert and oriented x 3, obese HEENT: NCAT, PERRL, EOMI, sclera clear, MMM PULM: Wheezing noted bilateral upper lung fields, no crackles, normal respiratory effort without accessory muscle use, on 3 L [place] with SpO2 93% at rest  CV: RRR w/o M/G/R GI: abd soft, NTND, NABS, no R/G/M MSK: no peripheral edema   Data Reviewed: I have personally reviewed following labs and imaging studies  CBC: Recent Labs  Lab 12/12/23 1750 12/13/23 0500 12/14/23 0539  WBC 11.0* 8.1 12.5*  NEUTROABS  --   --  9.3*  HGB 15.5 14.9 14.9  HCT 48.4 45.8 46.9  MCV 91.0 90.9 93.8  PLT 259 243 241   Basic Metabolic Panel: Recent Labs  Lab 12/12/23 1750 12/13/23 0500 12/14/23 0539  NA 137 139 138  K 4.4 4.7 4.3  CL 99 100 99  CO2 27 29 31   GLUCOSE 118* 159* 140*  BUN 10 12 19   CREATININE 0.76 0.76 0.83  CALCIUM 9.3 9.3 9.2  MG  --   --  2.3   GFR: Estimated Creatinine Clearance: 110.7 mL/min (by C-G formula based on SCr of 0.83 mg/dL). Liver Function Tests: Recent Labs  Lab 12/13/23 0500  AST 21  ALT 18  ALKPHOS 93  BILITOT 0.5  PROT 7.8  ALBUMIN 3.8   No results for input(s): LIPASE, AMYLASE in the last 168 hours. No results for input(s): AMMONIA in the last 168 hours. Coagulation Profile: No results for input(s): INR, PROTIME in the last 168 hours. Cardiac Enzymes: No results for input(s): CKTOTAL, CKMB, CKMBINDEX, TROPONINI in the last 168 hours. BNP (last 3 results) No results for input(s): PROBNP in the last 8760 hours. HbA1C: No results for input(s): HGBA1C in the last 72  hours. CBG: No results for input(s): GLUCAP in the last 168 hours. Lipid Profile: No results for input(s): CHOL, HDL, LDLCALC, TRIG, CHOLHDL, LDLDIRECT in the last 72 hours. Thyroid Function Tests: No results for input(s): TSH, T4TOTAL, FREET4, T3FREE, THYROIDAB in the last 72 hours. Anemia Panel: No results for input(s): VITAMINB12, FOLATE, FERRITIN, TIBC, IRON, RETICCTPCT in the last 72 hours. Sepsis Labs: Recent Labs  Lab 12/12/23 1750 12/12/23 1856  PROCALCITON <0.10  --   LATICACIDVEN  --  1.8    Recent Results (from the past 240 hours)  Blood culture (routine x 2)     Status: Abnormal   Collection Time: 12/12/23  6:20 PM   Specimen: BLOOD  Result Value Ref Range Status   Specimen Description   Final    BLOOD RIGHT ANTECUBITAL IV DRAW Performed at [facility], [address], [place], [place] [address]    Special Requests   Final    BOTTLES DRAWN AEROBIC AND ANAEROBIC Blood Culture  results may not be optimal due to an inadequate volume of blood received in culture bottles Performed at [facility], [address], [place], [place] [address]    Culture  Setup Time   Final    GRAM POSITIVE COCCI IN CLUSTERS AEROBIC BOTTLE ONLY CRITICAL RESULT CALLED TO, READ BACK BY AND VERIFIED WITH: L POINTDEXTER  PHARMD 12/14/2023 0521 BY DD    Culture (A)  Final    STAPHYLOCOCCUS EPIDERMIDIS THE SIGNIFICANCE OF ISOLATING THIS ORGANISM FROM A SINGLE SET OF BLOOD CULTURES WHEN MULTIPLE SETS ARE DRAWN IS UNCERTAIN. PLEASE NOTIFY THE MICROBIOLOGY DEPARTMENT WITHIN ONE WEEK IF SPECIATION AND SENSITIVITIES ARE REQUIRED. Performed at [facility], [address] [place], [place] [address]    Report Status 12/16/2023 FINAL  Final  Blood Culture ID Panel (Reflexed)     Status: Abnormal   Collection Time: 12/12/23  6:20 PM  Result Value Ref Range Status   Enterococcus faecalis NOT DETECTED NOT DETECTED Final    Enterococcus Faecium NOT DETECTED NOT DETECTED Final   Listeria monocytogenes NOT DETECTED NOT DETECTED Final   Staphylococcus species DETECTED (A) NOT DETECTED Final    Comment: CRITICAL RESULT CALLED TO, READ BACK BY AND VERIFIED WITH: L POINTDEXTER  PHARMD 12/14/2023 0521 BY DD    Staphylococcus aureus (BCID) NOT DETECTED NOT DETECTED Final   Staphylococcus epidermidis DETECTED (A) NOT DETECTED Final    Comment: Methicillin (oxacillin) resistant coagulase negative staphylococcus. Possible blood culture contaminant (unless isolated from more than one blood culture draw or clinical case suggests pathogenicity). No antibiotic treatment is indicated for blood  culture contaminants. CRITICAL RESULT CALLED TO, READ BACK BY AND VERIFIED WITH: L POINTDEXTER  PHARMD 12/14/2023 0521 BY DD    Staphylococcus lugdunensis NOT DETECTED NOT DETECTED Final   Streptococcus species NOT DETECTED NOT DETECTED Final   Streptococcus agalactiae NOT DETECTED NOT DETECTED Final   Streptococcus pneumoniae NOT DETECTED NOT DETECTED Final   Streptococcus pyogenes NOT DETECTED NOT DETECTED Final   A.calcoaceticus-baumannii NOT DETECTED NOT DETECTED Final   Bacteroides fragilis NOT DETECTED NOT DETECTED Final   Enterobacterales NOT DETECTED NOT DETECTED Final   Enterobacter cloacae complex NOT DETECTED NOT DETECTED Final   Escherichia coli NOT DETECTED NOT DETECTED Final   Klebsiella aerogenes NOT DETECTED NOT DETECTED Final   Klebsiella oxytoca NOT DETECTED NOT DETECTED Final   Klebsiella pneumoniae NOT DETECTED NOT DETECTED Final   Proteus species NOT DETECTED NOT DETECTED Final   Salmonella species NOT DETECTED NOT DETECTED Final   Serratia marcescens NOT DETECTED NOT DETECTED Final   Haemophilus influenzae NOT DETECTED NOT DETECTED Final   Neisseria meningitidis NOT DETECTED NOT DETECTED Final   Pseudomonas aeruginosa NOT DETECTED NOT DETECTED Final   Stenotrophomonas maltophilia NOT DETECTED NOT DETECTED  Final   Candida albicans NOT DETECTED NOT DETECTED Final   Candida auris NOT DETECTED NOT DETECTED Final   Candida glabrata NOT DETECTED NOT DETECTED Final   Candida krusei NOT DETECTED NOT DETECTED Final   Candida parapsilosis NOT DETECTED NOT DETECTED Final   Candida tropicalis NOT DETECTED NOT DETECTED Final   Cryptococcus neoformans/gattii NOT DETECTED NOT DETECTED Final   Methicillin resistance mecA/C DETECTED (A) NOT DETECTED Final    Comment: CRITICAL RESULT CALLED TO, READ BACK BY AND VERIFIED WITH: L POINTDEXTER  PHARMD 12/14/2023 0521 BY DD Performed at [facility], [address], [place], [place] [address]   Resp panel by RT-PCR (RSV, Flu A&B, Covid) Anterior Nasal Swab  Status: None   Collection Time: 12/12/23  8:07 PM   Specimen: Anterior Nasal Swab  Result Value Ref Range Status   SARS Coronavirus 2 by RT PCR NEGATIVE NEGATIVE Final    Comment: (NOTE) SARS-CoV-2 target nucleic acids are NOT DETECTED.  The SARS-CoV-2 RNA is generally detectable in upper respiratory specimens during the acute phase of infection. The lowest concentration of SARS-CoV-2 viral copies this assay can detect is 138 copies/mL. A negative result does not preclude SARS-Cov-2 infection and should not be used as the sole basis for treatment or other patient management decisions. A negative result may occur with  improper specimen collection/handling, submission of specimen other than nasopharyngeal swab, presence of viral mutation(s) within the areas targeted by this assay, and inadequate number of viral copies(<138 copies/mL). A negative result must be combined with clinical observations, patient history, and epidemiological information. The expected result is Negative.  Fact Sheet for Patients:  BloggerCourse.com  Fact Sheet for Healthcare Providers:  SeriousBroker.it  This test is no t yet approved or cleared by the [place]   FDA and  has been authorized for detection and/or diagnosis of SARS-CoV-2 by FDA under an Emergency Use Authorization (EUA). This EUA will remain  in effect (meaning this test can be used) for the duration of the COVID-19 declaration under Section 564(b)(1) of the Act, 21 U.S.C.section 360bbb-3(b)(1), unless the authorization is terminated  or revoked sooner.       Influenza A by PCR NEGATIVE NEGATIVE Final   Influenza B by PCR NEGATIVE NEGATIVE Final    Comment: (NOTE) The Xpert Xpress SARS-CoV-2/FLU/RSV plus assay is intended as an aid in the diagnosis of influenza from Nasopharyngeal swab specimens and should not be used as a sole basis for treatment. Nasal washings and aspirates are unacceptable for Xpert Xpress SARS-CoV-2/FLU/RSV testing.  Fact Sheet for Patients: BloggerCourse.com  Fact Sheet for Healthcare Providers: SeriousBroker.it  This test is not yet approved or cleared by the [place]  FDA and has been authorized for detection and/or diagnosis of SARS-CoV-2 by FDA under an Emergency Use Authorization (EUA). This EUA will remain in effect (meaning this test can be used) for the duration of the COVID-19 declaration under Section 564(b)(1) of the Act, 21 U.S.C. section 360bbb-3(b)(1), unless the authorization is terminated or revoked.     Resp Syncytial Virus by PCR NEGATIVE NEGATIVE Final    Comment: (NOTE) Fact Sheet for Patients: BloggerCourse.com  Fact Sheet for Healthcare Providers: SeriousBroker.it  This test is not yet approved or cleared by the [place]  FDA and has been authorized for detection and/or diagnosis of SARS-CoV-2 by FDA under an Emergency Use Authorization (EUA). This EUA will remain in effect (meaning this test can be used) for the duration of the COVID-19 declaration under Section 564(b)(1) of the Act, 21 U.S.C. section  360bbb-3(b)(1), unless the authorization is terminated or revoked.  Performed at [facility], [address], [place], [place] [address]   Blood culture (routine x 2)     Status: None (Preliminary result)   Collection Time: 12/13/23  5:52 PM   Specimen: BLOOD LEFT HAND  Result Value Ref Range Status   Specimen Description   Final    BLOOD LEFT HAND Performed at [facility], [address] [place], [place] [address]    Special Requests   Final    BOTTLES DRAWN AEROBIC AND ANAEROBIC Blood Culture adequate volume Performed at [facility], [address] [place], [place] [address]  Culture   Final    NO GROWTH 3 DAYS Performed at [facility], [address] [place], [place] [address]    Report Status PENDING  Incomplete         Radiology Studies: DG CHEST PORT 1 VIEW Result Date: 12/15/2023 CLINICAL DATA:  Shortness of breath EXAM: PORTABLE CHEST 1 VIEW COMPARISON:  12/12/2023 FINDINGS: Cardiac shadow is stable. Elevation of left hemidiaphragm is again seen. Chronic atelectatic changes in the left base are noted. Slight increase in right basilar atelectasis is noted as well. No effusion is seen. No bony abnormality noted. IMPRESSION: Slight increase in right basilar atelectasis. Electronically Signed   By: Oneil Devonshire M.D.   On: 12/15/2023 20:51         Scheduled Meds:  arformoterol  15 mcg Nebulization BID   budesonide (PULMICORT) nebulizer solution  0.5 mg Nebulization BID   dextromethorphan-guaiFENesin  1 tablet Oral BID   enoxaparin  (LOVENOX ) injection  50 mg Subcutaneous Q24H   ipratropium-albuterol  3 mL Nebulization Q6H   methylPREDNISolone (SOLU-MEDROL) injection  80 mg Intravenous Q24H   Continuous Infusions:   LOS: 4 days    Time spent: 48 minutes spent on 12/16/2023 caring for this patient face-to-face including chart review, ordering labs/tests, documenting, discussion with nursing staff,  consultants, updating family and interview/physical exam    Camellia PARAS [place], DO [facility] Available via Epic secure chat 7am-7pm After these hours, please refer to coverage provider listed on [email or web address] 12/16/2023, 1:11 PM

## 2023-12-17 ENCOUNTER — Inpatient Hospital Stay (HOSPITAL_COMMUNITY)

## 2023-12-17 DIAGNOSIS — J9621 Acute and chronic respiratory failure with hypoxia: Secondary | ICD-10-CM | POA: Diagnosis not present

## 2023-12-17 LAB — GLUCOSE, CAPILLARY: Glucose-Capillary: 105 mg/dL — ABNORMAL HIGH (ref 70–99)

## 2023-12-17 MED ORDER — METHYLPREDNISOLONE SODIUM SUCC 40 MG IJ SOLR
40.0000 mg | INTRAMUSCULAR | Status: DC
  Administered 2023-12-17 – 2023-12-19 (×3): 40 mg via INTRAVENOUS
  Filled 2023-12-17 (×3): qty 1

## 2023-12-17 MED ORDER — PROMETHAZINE (PHENERGAN) 6.25MG IN NS 50ML IVPB
6.2500 mg | Freq: Four times a day (QID) | INTRAVENOUS | Status: DC | PRN
  Administered 2023-12-17 – 2023-12-18 (×2): 6.25 mg via INTRAVENOUS
  Filled 2023-12-17: qty 6.25
  Filled 2023-12-17: qty 0.25

## 2023-12-17 NOTE — Progress Notes (Signed)
 PROGRESS NOTE    Blake Lara  FMW:[id number] DOB: August 31, 1980 DOA: 12/12/2023 PCP: Blake Atlas, MD    Brief Narrative:   Blake Lara is a 43 y.o. 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   Patient reports that last night, he started having shortness of breath and was found to have SpO2 in the 70s to 80s requiring an increase in his home O2 to 4 L [place].  He also reports associated wheezing, cough with clear sputum and chest discomfort with the constant coughing but denies any fevers, chills, headache, dizziness, chest pain or change in ileostomy output. Patient endorses chronic abdominal pain from his multiple abdominal surgeries currently on daily oxycodone  and oxymorphone.    In the ED, afebrile, RR 16-24, HR 90-100s, SBP 110-170s, SpO2 96% on 4 L [place]. Initial labs significant for WBC of 11 otherwise normal renal function and lactic acid. EKG shows sinus rhythm. CXR shows low lung volumes with streaky bibasilar opacities, favor atelectasis. Pt received IV morphine , IV Dilaudid , IV Solu-Medrol, DuoNebs x 3, IV doxycycline  and IV Levaquin .  TRH was consulted for admission.   Assessment & Plan:   Acute on chronic hypoxic respiratory failure COPD exacerbation Patient presenting with progressive shortness of breath, cough and wheezing.  At baseline on 2 L nasal cannula nocturnally.  Patient is afebrile, slightly elevated WC count of 11.  Chest x-ray with low lung volumes, streaky bibasilar opacities that favors atelectasis.  COVID, RSV, influenza PCR negative.  Procalcitonin within normal limits.  Complicated  by continued tobacco use disorder. -- Azithromycin 500 mg p.o. daily x 5 days -- Pulmicort neb twice daily -- Brovana neb twice daily -- DuoNeb every 6 hours -- Albuterol neb q2h PRN wheezing/shortness of breath -- Solu-Medrol 40 mg IV every 24 hours -- Hycodan cough syrup every 6 hours as needed cough -- Ativan as needed anxiety -- Continue supplemental oxygen to maintain SpO2 greater than 88%, at baseline on 2 L nocturnally -- Incentive spirometry, flutter valve -- Check CT chest without contrast given persistence of symptoms today  Anxiety -- Ativan 0.5-1 mg p.o. every 6 hours as needed anxiety  Spina bifida Multiple hernias s/p multiple abdominal surgeries Chronic abdominal pain S/p ileostomy Stable -- Continue scheduled home oxycodone  20mg  PO q4h -- Dilaudid  2mg  IV q2h PRN breakthrough pain   Tobacco use disorder Reports smoking half a pack of cigarettes per day, previously quit for 10 years and resumed back in 2015 -- Counseled on need for complete cessation/abstinence   Morbid obesity Body mass index is 44.03 kg/m.    DVT prophylaxis: Lovenox    Code Status: Full Code Family Communication: No family present at bedside this afternoon  Disposition Plan:  Level of care: Telemetry Status is: Inpatient Remains inpatient appropriate because: IV steroids, weaning from oxygen; CT chest given persistence of symptoms    Consultants:  None  Procedures:  None  Antimicrobials:  Azithromycin   Subjective: Patient seen examined bedside, lying in bed.  Continues to not feeling well, continued cough and shortness of breath.  Will check CT chest given persistence of symptoms today.   No other questions or concerns at this time.  Denies headache, no dizziness, no chest  pain, no palpitations, no nausea/vomiting/diarrhea, no fever/chills/night sweats.  No acute events overnight per nursing staff.  Objective: Vitals:   12/17/23 0250 12/17/23 0519 12/17/23 0747 12/17/23  0908  BP:  135/84 (!) 158/91   Pulse:  80 80   Resp:  19    Temp:  98.2 F (36.8 C) 97.9 F (36.6 C)   TempSrc:  Oral Oral   SpO2: 93% 92% 97% 92%  Weight:      Height:        Intake/Output Summary (Last 24 hours) at 12/17/2023 1150 Last data filed at 12/17/2023 0556 Gross per 24 hour  Intake 360 ml  Output 210 ml  Net 150 ml   Filed Weights   12/12/23 1731  Weight: 98.9 kg    Examination:  Physical Exam: GEN: NAD, alert and oriented x 3, obese HEENT: NCAT, PERRL, EOMI, sclera clear, MMM PULM: Wheezing noted bilateral upper lung fields, no crackles, normal respiratory effort without accessory muscle use, on 3 L [place] with SpO2 93% at rest  CV: RRR w/o M/G/R GI: abd soft, NTND, NABS, no R/G/M MSK: no peripheral edema   Data Reviewed: I have personally reviewed following labs and imaging studies  CBC: Recent Labs  Lab 12/12/23 1750 12/13/23 0500 12/14/23 0539  WBC 11.0* 8.1 12.5*  NEUTROABS  --   --  9.3*  HGB 15.5 14.9 14.9  HCT 48.4 45.8 46.9  MCV 91.0 90.9 93.8  PLT 259 243 241   Basic Metabolic Panel: Recent Labs  Lab 12/12/23 1750 12/13/23 0500 12/14/23 0539  NA 137 139 138  K 4.4 4.7 4.3  CL 99 100 99  CO2 27 29 31   GLUCOSE 118* 159* 140*  BUN 10 12 19   CREATININE 0.76 0.76 0.83  CALCIUM 9.3 9.3 9.2  MG  --   --  2.3   GFR: Estimated Creatinine Clearance: 110.7 mL/min (by C-G formula based on SCr of 0.83 mg/dL). Liver Function Tests: Recent Labs  Lab 12/13/23 0500  AST 21  ALT 18  ALKPHOS 93  BILITOT 0.5  PROT 7.8  ALBUMIN 3.8   No results for input(s): LIPASE, AMYLASE in the last 168 hours. No results for input(s): AMMONIA in the last 168 hours. Coagulation Profile: No results for input(s): INR, PROTIME in the last 168 hours. Cardiac Enzymes: No results for input(s): CKTOTAL, CKMB, CKMBINDEX, TROPONINI in the last 168 hours. BNP (last 3 results) No results for input(s): PROBNP in the last 8760  hours. HbA1C: No results for input(s): HGBA1C in the last 72 hours. CBG: Recent Labs  Lab 12/17/23 0748  GLUCAP 105*   Lipid Profile: No results for input(s): CHOL, HDL, LDLCALC, TRIG, CHOLHDL, LDLDIRECT in the last 72 hours. Thyroid Function Tests: No results for input(s): TSH, T4TOTAL, FREET4, T3FREE, THYROIDAB in the last 72 hours. Anemia Panel: No results for input(s): VITAMINB12, FOLATE, FERRITIN, TIBC, IRON, RETICCTPCT in the last 72 hours. Sepsis Labs: Recent Labs  Lab 12/12/23 1750 12/12/23 1856  PROCALCITON <0.10  --   LATICACIDVEN  --  1.8    Recent Results (from the past 240 hours)  Blood culture (routine x 2)     Status: Abnormal   Collection Time: 12/12/23  6:20 PM   Specimen: BLOOD  Result Value Ref Range Status   Specimen Description   Final    BLOOD RIGHT ANTECUBITAL IV DRAW Performed at [facility], [address] [place], [place] [address]    Special Requests   Final  BOTTLES DRAWN AEROBIC AND ANAEROBIC Blood Culture results may not be optimal due to an inadequate volume of blood received in culture bottles Performed at [facility], [address], [place], [place] [address]    Culture  Setup Time   Final    GRAM POSITIVE COCCI IN CLUSTERS AEROBIC BOTTLE ONLY CRITICAL RESULT CALLED TO, READ BACK BY AND VERIFIED WITH: L POINTDEXTER  PHARMD 12/14/2023 0521 BY DD    Culture (A)  Final    STAPHYLOCOCCUS EPIDERMIDIS THE SIGNIFICANCE OF ISOLATING THIS ORGANISM FROM A SINGLE SET OF BLOOD CULTURES WHEN MULTIPLE SETS ARE DRAWN IS UNCERTAIN. PLEASE NOTIFY THE MICROBIOLOGY DEPARTMENT WITHIN ONE WEEK IF SPECIATION AND SENSITIVITIES ARE REQUIRED. Performed at [facility], [address] [place], [place] [address]    Report Status 12/16/2023 FINAL  Final  Blood Culture ID Panel (Reflexed)     Status: Abnormal   Collection Time: 12/12/23  6:20 PM  Result Value Ref Range Status    Enterococcus faecalis NOT DETECTED NOT DETECTED Final   Enterococcus Faecium NOT DETECTED NOT DETECTED Final   Listeria monocytogenes NOT DETECTED NOT DETECTED Final   Staphylococcus species DETECTED (A) NOT DETECTED Final    Comment: CRITICAL RESULT CALLED TO, READ BACK BY AND VERIFIED WITH: L POINTDEXTER  PHARMD 12/14/2023 0521 BY DD    Staphylococcus aureus (BCID) NOT DETECTED NOT DETECTED Final   Staphylococcus epidermidis DETECTED (A) NOT DETECTED Final    Comment: Methicillin (oxacillin) resistant coagulase negative staphylococcus. Possible blood culture contaminant (unless isolated from more than one blood culture draw or clinical case suggests pathogenicity). No antibiotic treatment is indicated for blood  culture contaminants. CRITICAL RESULT CALLED TO, READ BACK BY AND VERIFIED WITH: L POINTDEXTER  PHARMD 12/14/2023 0521 BY DD    Staphylococcus lugdunensis NOT DETECTED NOT DETECTED Final   Streptococcus species NOT DETECTED NOT DETECTED Final   Streptococcus agalactiae NOT DETECTED NOT DETECTED Final   Streptococcus pneumoniae NOT DETECTED NOT DETECTED Final   Streptococcus pyogenes NOT DETECTED NOT DETECTED Final   A.calcoaceticus-baumannii NOT DETECTED NOT DETECTED Final   Bacteroides fragilis NOT DETECTED NOT DETECTED Final   Enterobacterales NOT DETECTED NOT DETECTED Final   Enterobacter cloacae complex NOT DETECTED NOT DETECTED Final   Escherichia coli NOT DETECTED NOT DETECTED Final   Klebsiella aerogenes NOT DETECTED NOT DETECTED Final   Klebsiella oxytoca NOT DETECTED NOT DETECTED Final   Klebsiella pneumoniae NOT DETECTED NOT DETECTED Final   Proteus species NOT DETECTED NOT DETECTED Final   Salmonella species NOT DETECTED NOT DETECTED Final   Serratia marcescens NOT DETECTED NOT DETECTED Final   Haemophilus influenzae NOT DETECTED NOT DETECTED Final   Neisseria meningitidis NOT DETECTED NOT DETECTED Final   Pseudomonas aeruginosa NOT DETECTED NOT DETECTED Final    Stenotrophomonas maltophilia NOT DETECTED NOT DETECTED Final   Candida albicans NOT DETECTED NOT DETECTED Final   Candida auris NOT DETECTED NOT DETECTED Final   Candida glabrata NOT DETECTED NOT DETECTED Final   Candida krusei NOT DETECTED NOT DETECTED Final   Candida parapsilosis NOT DETECTED NOT DETECTED Final   Candida tropicalis NOT DETECTED NOT DETECTED Final   Cryptococcus neoformans/gattii NOT DETECTED NOT DETECTED Final   Methicillin resistance mecA/C DETECTED (A) NOT DETECTED Final    Comment: CRITICAL RESULT CALLED TO, READ BACK BY AND VERIFIED WITH: L POINTDEXTER  PHARMD 12/14/2023 0521 BY DD Performed at [facility], [address] [place], [place] [address]   Resp panel by RT-PCR (RSV,  Flu A&B, Covid) Anterior Nasal Swab     Status: None   Collection Time: 12/12/23  8:07 PM   Specimen: Anterior Nasal Swab  Result Value Ref Range Status   SARS Coronavirus 2 by RT PCR NEGATIVE NEGATIVE Final    Comment: (NOTE) SARS-CoV-2 target nucleic acids are NOT DETECTED.  The SARS-CoV-2 RNA is generally detectable in upper respiratory specimens during the acute phase of infection. The lowest concentration of SARS-CoV-2 viral copies this assay can detect is 138 copies/mL. A negative result does not preclude SARS-Cov-2 infection and should not be used as the sole basis for treatment or other patient management decisions. A negative result may occur with  improper specimen collection/handling, submission of specimen other than nasopharyngeal swab, presence of viral mutation(s) within the areas targeted by this assay, and inadequate number of viral copies(<138 copies/mL). A negative result must be combined with clinical observations, patient history, and epidemiological information. The expected result is Negative.  Fact Sheet for Patients:  BloggerCourse.com  Fact Sheet for Healthcare Providers:  SeriousBroker.it  This  test is no t yet approved or cleared by the [place]  FDA and  has been authorized for detection and/or diagnosis of SARS-CoV-2 by FDA under an Emergency Use Authorization (EUA). This EUA will remain  in effect (meaning this test can be used) for the duration of the COVID-19 declaration under Section 564(b)(1) of the Act, 21 U.S.C.section 360bbb-3(b)(1), unless the authorization is terminated  or revoked sooner.       Influenza A by PCR NEGATIVE NEGATIVE Final   Influenza B by PCR NEGATIVE NEGATIVE Final    Comment: (NOTE) The Xpert Xpress SARS-CoV-2/FLU/RSV plus assay is intended as an aid in the diagnosis of influenza from Nasopharyngeal swab specimens and should not be used as a sole basis for treatment. Nasal washings and aspirates are unacceptable for Xpert Xpress SARS-CoV-2/FLU/RSV testing.  Fact Sheet for Patients: BloggerCourse.com  Fact Sheet for Healthcare Providers: SeriousBroker.it  This test is not yet approved or cleared by the [place]  FDA and has been authorized for detection and/or diagnosis of SARS-CoV-2 by FDA under an Emergency Use Authorization (EUA). This EUA will remain in effect (meaning this test can be used) for the duration of the COVID-19 declaration under Section 564(b)(1) of the Act, 21 U.S.C. section 360bbb-3(b)(1), unless the authorization is terminated or revoked.     Resp Syncytial Virus by PCR NEGATIVE NEGATIVE Final    Comment: (NOTE) Fact Sheet for Patients: BloggerCourse.com  Fact Sheet for Healthcare Providers: SeriousBroker.it  This test is not yet approved or cleared by the [place]  FDA and has been authorized for detection and/or diagnosis of SARS-CoV-2 by FDA under an Emergency Use Authorization (EUA). This EUA will remain in effect (meaning this test can be used) for the duration of the COVID-19 declaration under  Section 564(b)(1) of the Act, 21 U.S.C. section 360bbb-3(b)(1), unless the authorization is terminated or revoked.  Performed at [facility], [address], [place], [place] [address]   Blood culture (routine x 2)     Status: None (Preliminary result)   Collection Time: 12/13/23  5:52 PM   Specimen: BLOOD LEFT HAND  Result Value Ref Range Status   Specimen Description   Final    BLOOD LEFT HAND Performed at [facility], [address], [place], [place] [address]    Special Requests   Final    BOTTLES DRAWN AEROBIC AND ANAEROBIC Blood Culture adequate volume Performed at [facility], [address] [place], [place] [address]    Culture   Final    NO GROWTH 4 DAYS Performed at [facility], [address], [place], [place] [address]    Report Status PENDING  Incomplete         Radiology Studies: DG CHEST PORT 1 VIEW Result Date: 12/15/2023 CLINICAL DATA:  Shortness of breath EXAM: PORTABLE CHEST 1 VIEW COMPARISON:  12/12/2023 FINDINGS: Cardiac shadow is stable. Elevation of left hemidiaphragm is again seen. Chronic atelectatic changes in the left base are noted. Slight increase in right basilar atelectasis is noted as well. No effusion is seen. No bony abnormality noted. IMPRESSION: Slight increase in right basilar atelectasis. Electronically Signed   By: Oneil Devonshire M.D.   On: 12/15/2023 20:51         Scheduled Meds:  arformoterol  15 mcg Nebulization BID   budesonide (PULMICORT) nebulizer solution  0.5 mg Nebulization BID   dextromethorphan-guaiFENesin  1 tablet Oral BID   enoxaparin  (LOVENOX ) injection  50 mg Subcutaneous Q24H   ipratropium-albuterol  3 mL Nebulization Q6H   methylPREDNISolone (SOLU-MEDROL) injection  40 mg Intravenous Q24H   traZODone  50 mg Oral Once   Continuous Infusions:  promethazine  (PHENERGAN ) injection (IM or IVPB)       LOS: 5 days    Time spent: 48 minutes spent on 12/17/2023  caring for this patient face-to-face including chart review, ordering labs/tests, documenting, discussion with nursing staff, consultants, updating family and interview/physical exam    Camellia PARAS [place], DO [facility] Available via Epic secure chat 7am-7pm After these hours, please refer to coverage provider listed on [email or web address] 12/17/2023, 11:50 AM

## 2023-12-17 NOTE — TOC Initial Note (Signed)
 Transition of Care [facility]) - Initial/Assessment Note    Patient Details  Name: Blake Lara MRN: [id number] Date of Birth: May 15, 1980  Transition of Care [facility]) CM/SW Contact:    Sheri ONEIDA Sharps, LCSW Phone Number: 12/17/2023, 1:47 PM  Clinical Narrative:                 Pt from home w/ spouse. Pt continues medical workup. Pt currently on 3L of O2. ICM following for dc needs.    Barriers to Discharge: Continued Medical Work up   Patient Goals and CMS Choice Patient states their goals for this hospitalization and ongoing recovery are:: return home   Choice offered to / list presented to : NA      Expected Discharge Plan and Services In-house Referral: NA Discharge Planning Services: NA   Living arrangements for the past 2 months: Single Family Home                 DME Arranged: N/A DME Agency: NA       HH Arranged: NA HH Agency: NA        Prior Living Arrangements/Services Living arrangements for the past 2 months: Single Family Home Lives with:: Spouse Patient language and need for interpreter reviewed:: Yes Do you feel safe going back to the place where you live?: Yes      Need for Family Participation in Patient Care: Yes (Comment) Care giver support system in place?: Yes (comment)   Criminal Activity/Legal Involvement Pertinent to Current Situation/Hospitalization: No - Comment as needed  Activities of Daily Living   ADL Screening (condition at time of admission) Independently performs ADLs?: No Does the patient have a NEW difficulty with bathing/dressing/toileting/self-feeding that is expected to last >3 days?: No Does the patient have a NEW difficulty with getting in/out of bed, walking, or climbing stairs that is expected to last >3 days?: No Does the patient have a NEW difficulty with communication that is expected to last >3 days?: No Is the patient deaf or have difficulty hearing?: No Does the patient have difficulty seeing, even when wearing  glasses/contacts?: No Does the patient have difficulty concentrating, remembering, or making decisions?: No  Permission Sought/Granted                  Emotional Assessment Appearance:: Appears stated age Attitude/Demeanor/Rapport: Engaged Affect (typically observed): Accepting Orientation: : Oriented to Self, Oriented to Place, Oriented to  Time, Oriented to Situation Alcohol / Substance Use: Not Applicable Psych Involvement: No (comment)  Admission diagnosis:  COPD exacerbation (HCC) [J44.1] Acute on chronic respiratory failure with hypoxia (HCC) [J96.21] Pneumonia due to infectious organism, unspecified laterality, unspecified part of lung [J18.9] Patient Active Problem List   Diagnosis Date Noted   Acute on chronic respiratory failure with hypoxia (HCC) 12/12/2023   COPD exacerbation (HCC) 12/12/2023   Obesity, Class III, BMI 40-49.9 (morbid obesity) (HCC) 12/12/2023   Chronic abdominal pain 12/12/2023   Spina bifida (HCC) 12/12/2023   History of ventral hernia 12/12/2023   Ureteral stone 04/05/2014   Nephrolithiasis 04/04/2014   Cigarette smoker 02/13/2014   Pulmonary infiltrates    Acute respiratory failure (HCC)    Aspiration into airway    Pyrexia    Hydronephrosis    Fever 01/16/2014   Ureteral obstruction    PCP:  Shelda Atlas, MD Pharmacy:   [facility] [phone] - [place], [place] - [address] AT [facility] OF [address] [place]  [address]  Phone: [phone] Fax: [phone]     Social Drivers of Health (SDOH) Social History: SDOH Screenings   Food Insecurity: No Food Insecurity (12/14/2023)  Housing: Low Risk  (12/14/2023)  Transportation Needs: No Transportation Needs (12/14/2023)  Utilities: Not At Risk (12/14/2023)  Tobacco Use: High Risk (12/12/2023)   SDOH Interventions:     Readmission Risk Interventions    12/17/2023    1:45 PM  Readmission Risk Prevention Plan  Post Dischage Appt  Complete  Medication Screening Complete  Transportation Screening Complete

## 2023-12-18 DIAGNOSIS — J9621 Acute and chronic respiratory failure with hypoxia: Secondary | ICD-10-CM | POA: Diagnosis not present

## 2023-12-18 LAB — BASIC METABOLIC PANEL WITH GFR
Anion gap: 7 (ref 5–15)
BUN: 20 mg/dL (ref 6–20)
CO2: 32 mmol/L (ref 22–32)
Calcium: 8.5 mg/dL — ABNORMAL LOW (ref 8.9–10.3)
Chloride: 100 mmol/L (ref 98–111)
Creatinine, Ser: 0.72 mg/dL (ref 0.61–1.24)
GFR, Estimated: >60 mL/min (ref >60)
Glucose, Bld: 90 mg/dL (ref 70–99)
Potassium: 4.2 mmol/L (ref 3.5–5.1)
Sodium: 139 mmol/L (ref 135–145)

## 2023-12-18 LAB — CULTURE, BLOOD (ROUTINE X 2)
Culture: NO GROWTH
Special Requests: ADEQUATE

## 2023-12-18 LAB — SEDIMENTATION RATE: Sed Rate: 8 mm/h (ref 0–16)

## 2023-12-18 LAB — CBC
HCT: 48.5 % (ref 39.0–52.0)
Hemoglobin: 15.2 g/dL (ref 13.0–17.0)
MCH: 29.3 pg (ref 26.0–34.0)
MCHC: 31.3 g/dL (ref 30.0–36.0)
MCV: 93.6 fL (ref 80.0–100.0)
Platelets: 251 K/uL (ref 150–400)
RBC: 5.18 MIL/uL (ref 4.22–5.81)
RDW: 13.2 % (ref 11.5–15.5)
WBC: 15.2 K/uL — ABNORMAL HIGH (ref 4.0–10.5)
nRBC: 0 % (ref 0.0–0.2)

## 2023-12-18 LAB — PROCALCITONIN: Procalcitonin: <0.1 ng/mL

## 2023-12-18 LAB — C-REACTIVE PROTEIN: CRP: 0.5 mg/dL (ref <1.0)

## 2023-12-18 MED ORDER — DIPHENHYDRAMINE HCL 25 MG PO CAPS
25.0000 mg | ORAL_CAPSULE | Freq: Once | ORAL | Status: AC
  Administered 2023-12-18: 25 mg via ORAL
  Filled 2023-12-18: qty 1

## 2023-12-18 NOTE — Progress Notes (Signed)
 RT went to do patients breathing medication. Pt states I can't do anything tell I get something for my nerves. Pt was given Dilaudid  at 08:55. Pt has PRN if needed. RN aware of medication needed. No distress noted at this time.

## 2023-12-18 NOTE — Progress Notes (Signed)
 PROGRESS NOTE    Blake Lara  FMW:[id number] DOB: [birth year] DOA: 12/12/2023 PCP: Shelda Atlas, MD    Brief Narrative:   Blake Lara is a 42 y.o. 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   Patient reports that last night, he started having shortness of breath and was found to have SpO2 in the 70s to 80s requiring an increase in his home O2 to 4 L [place].  He also reports associated wheezing, cough with clear sputum and chest discomfort with the constant coughing but denies any fevers, chills, headache, dizziness, chest pain or change in ileostomy output. Patient endorses chronic abdominal pain from his multiple abdominal surgeries currently on daily oxycodone  and oxymorphone.    In the ED, afebrile, RR 16-24, HR 90-100s, SBP 110-170s, SpO2 96% on 4 L [place]. Initial labs significant for WBC of 11 otherwise normal renal function and lactic acid. EKG shows sinus rhythm. CXR shows low lung volumes with streaky bibasilar opacities, favor atelectasis. Pt received IV morphine , IV Dilaudid , IV Solu-Medrol, DuoNebs x 3, IV doxycycline  and IV Levaquin .  TRH was consulted for admission.   Assessment & Plan:   Acute on chronic hypoxic respiratory failure COPD exacerbation Patient presenting with progressive shortness of breath, cough and wheezing.  At baseline on 2 L nasal cannula nocturnally.  Patient is afebrile, slightly elevated WC count of 11.  Chest x-ray with low lung volumes, streaky bibasilar opacities that favors atelectasis.  COVID, RSV, influenza PCR negative.  Procalcitonin within normal limits.  CT chest  without contrast with scattered ground glass opacities bilateral lungs, stranding atelectasis, dilated pulmonary trunk consistent with pulmonary hypertension.  Complicated by continued tobacco use disorder.  Completed 5-day course of azithromycin. -- Pulmicort neb twice daily -- Brovana neb twice daily -- DuoNeb every 6 hours -- Albuterol neb q2h PRN wheezing/shortness of breath -- Solu-Medrol 40 mg IV every 24 hours -- Hycodan cough syrup every 6 hours as needed cough -- Ativan as needed anxiety -- Continue supplemental oxygen to maintain SpO2 greater than 88%, at baseline on 2 L nocturnally -- Incentive spirometry, flutter valve  Anxiety -- Ativan 0.5-1 mg p.o. every 6 hours as needed anxiety  Spina bifida Multiple hernias s/p multiple abdominal surgeries Chronic abdominal pain S/p ileostomy Stable -- Continue scheduled home oxycodone  20mg  PO q4h -- Dilaudid  2mg  IV q2h PRN breakthrough pain   Tobacco use disorder Reports smoking half a pack of cigarettes per day, previously quit for 10 years and resumed back in 2015 -- Counseled on need for complete cessation/abstinence   Morbid obesity Body mass index is 44.03 kg/m.    DVT prophylaxis: Lovenox    Code Status: Full Code Family Communication: Updated spouse present at bedside this morning  Disposition Plan:  Level of care: Med-Surg Status is: Inpatient Remains inpatient appropriate because: IV steroids, weaning from oxygen; anticipate discharge home tomorrow    Consultants:  None  Procedures:  None  Antimicrobials:  Azithromycin   Subjective: Patient seen examined bedside, lying in bed.  Spouse present at bedside.  Continues with cough, shortness of breath.  Discussed with patient anticipate discharge home tomorrow and will place on controlling inhaler, received nebulizer machine and continue steroid taper.  No other questions or concerns at this time.  Denies headache, no dizziness, no chest pain, no  palpitations, no nausea/vomiting/diarrhea, no fever/chills/night sweats.  No acute events overnight per nursing staff.  Objective: Vitals:   12/17/23 2044 12/17/23 2107 12/18/23 0258 12/18/23 0434  BP:  113/66  124/67  Pulse:  73  83  Resp:  18  19  Temp:  98.3 F (36.8 C)  99.3 F (37.4 C)  TempSrc:  Oral  Oral  SpO2: 94% 95% 93% 96%  Weight:      Height:        Intake/Output Summary (Last 24 hours) at 12/18/2023 1024 Last data filed at 12/18/2023 9367 Gross per 24 hour  Intake 170 ml  Output 670 ml  Net -500 ml   Filed Weights   12/12/23 1731  Weight: 98.9 kg    Examination:  Physical Exam: GEN: NAD, alert and oriented x 3, obese HEENT: NCAT, PERRL, EOMI, sclera clear, MMM PULM: Wheezing noted bilateral upper lung fields, no crackles, normal respiratory effort without accessory muscle use, on 3 L [place] with SpO2 96% at rest  CV: RRR w/o M/G/R GI: abd soft, NTND, NABS, no R/G/M MSK: no peripheral edema   Data Reviewed: I have personally reviewed following labs and imaging studies  CBC: Recent Labs  Lab 12/12/23 1750 12/13/23 0500 12/14/23 0539 12/18/23 0531  WBC 11.0* 8.1 12.5* 15.2*  NEUTROABS  --   --  9.3*  --   HGB 15.5 14.9 14.9 15.2  HCT 48.4 45.8 46.9 48.5  MCV 91.0 90.9 93.8 93.6  PLT 259 243 241 251   Basic Metabolic Panel: Recent Labs  Lab 12/12/23 1750 12/13/23 0500 12/14/23 0539 12/18/23 0531  NA 137 139 138 139  K 4.4 4.7 4.3 4.2  CL 99 100 99 100  CO2 27 29 31  32  GLUCOSE 118* 159* 140* 90  BUN 10 12 19 20   CREATININE 0.76 0.76 0.83 0.72  CALCIUM 9.3 9.3 9.2 8.5*  MG  --   --  2.3  --    GFR: Estimated Creatinine Clearance: 114.9 mL/min (by C-G formula based on SCr of 0.72 mg/dL). Liver Function Tests: Recent Labs  Lab 12/13/23 0500  AST 21  ALT 18  ALKPHOS 93  BILITOT 0.5  PROT 7.8  ALBUMIN 3.8   No results for input(s): LIPASE, AMYLASE in the last 168 hours. No results for input(s): AMMONIA in the last 168  hours. Coagulation Profile: No results for input(s): INR, PROTIME in the last 168 hours. Cardiac Enzymes: No results for input(s): CKTOTAL, CKMB, CKMBINDEX, TROPONINI in the last 168 hours. BNP (last 3 results) No results for input(s): PROBNP in the last 8760 hours. HbA1C: No results for input(s): HGBA1C in the last 72 hours. CBG: Recent Labs  Lab 12/17/23 0748  GLUCAP 105*   Lipid Profile: No results for input(s): CHOL, HDL, LDLCALC, TRIG, CHOLHDL, LDLDIRECT in the last 72 hours. Thyroid Function Tests: No results for input(s): TSH, T4TOTAL, FREET4, T3FREE, THYROIDAB in the last 72 hours. Anemia Panel: No results for input(s): VITAMINB12, FOLATE, FERRITIN, TIBC, IRON, RETICCTPCT in the last 72 hours. Sepsis Labs: Recent Labs  Lab 12/12/23 1750 12/12/23 1856 12/18/23 0531  PROCALCITON <0.10  --  <0.10  LATICACIDVEN  --  1.8  --     Recent Results (from the past 240 hours)  Blood culture (routine x 2)     Status: Abnormal   Collection Time: 12/12/23  6:20 PM   Specimen: BLOOD  Result Value Ref Range Status   Specimen Description   Final    BLOOD RIGHT ANTECUBITAL IV DRAW Performed at [facility], [address], [place], [place] [address]    Special Requests   Final    BOTTLES DRAWN AEROBIC AND ANAEROBIC Blood Culture results may not be optimal due to an inadequate volume of blood received in culture bottles Performed at [facility], [address] [place], [place] [address]    Culture  Setup Time   Final    GRAM POSITIVE COCCI IN CLUSTERS AEROBIC BOTTLE ONLY CRITICAL RESULT CALLED TO, READ BACK BY AND VERIFIED WITH: L POINTDEXTER  PHARMD 12/14/2023 0521 BY DD    Culture (A)  Final    STAPHYLOCOCCUS EPIDERMIDIS THE SIGNIFICANCE OF ISOLATING THIS ORGANISM FROM A SINGLE SET OF BLOOD CULTURES WHEN MULTIPLE SETS ARE DRAWN IS UNCERTAIN. PLEASE NOTIFY THE MICROBIOLOGY DEPARTMENT  WITHIN ONE WEEK IF SPECIATION AND SENSITIVITIES ARE REQUIRED. Performed at [facility], [address] [place], [place] [address]    Report Status 12/16/2023 FINAL  Final  Blood Culture ID Panel (Reflexed)     Status: Abnormal   Collection Time: 12/12/23  6:20 PM  Result Value Ref Range Status   Enterococcus faecalis NOT DETECTED NOT DETECTED Final   Enterococcus Faecium NOT DETECTED NOT DETECTED Final   Listeria monocytogenes NOT DETECTED NOT DETECTED Final   Staphylococcus species DETECTED (A) NOT DETECTED Final    Comment: CRITICAL RESULT CALLED TO, READ BACK BY AND VERIFIED WITH: L POINTDEXTER  PHARMD 12/14/2023 0521 BY DD    Staphylococcus aureus (BCID) NOT DETECTED NOT DETECTED Final   Staphylococcus epidermidis DETECTED (A) NOT DETECTED Final    Comment: Methicillin (oxacillin) resistant coagulase negative staphylococcus. Possible blood culture contaminant (unless isolated from more than one blood culture draw or clinical case suggests pathogenicity). No antibiotic treatment is indicated for blood  culture contaminants. CRITICAL RESULT CALLED TO, READ BACK BY AND VERIFIED WITH: L POINTDEXTER  PHARMD 12/14/2023 0521 BY DD    Staphylococcus lugdunensis NOT DETECTED NOT DETECTED Final   Streptococcus species NOT DETECTED NOT DETECTED Final   Streptococcus agalactiae NOT DETECTED NOT DETECTED Final   Streptococcus pneumoniae NOT DETECTED NOT DETECTED Final   Streptococcus pyogenes NOT DETECTED NOT DETECTED Final   A.calcoaceticus-baumannii NOT DETECTED NOT DETECTED Final   Bacteroides fragilis NOT DETECTED NOT DETECTED Final   Enterobacterales NOT DETECTED NOT DETECTED Final   Enterobacter cloacae complex NOT DETECTED NOT DETECTED Final   Escherichia coli NOT DETECTED NOT DETECTED Final   Klebsiella aerogenes NOT DETECTED NOT DETECTED Final   Klebsiella oxytoca NOT DETECTED NOT DETECTED Final   Klebsiella pneumoniae NOT DETECTED NOT DETECTED Final   Proteus species NOT  DETECTED NOT DETECTED Final   Salmonella species NOT DETECTED NOT DETECTED Final   Serratia marcescens NOT DETECTED NOT DETECTED Final   Haemophilus influenzae NOT DETECTED NOT DETECTED Final   Neisseria meningitidis NOT DETECTED NOT DETECTED Final   Pseudomonas aeruginosa NOT DETECTED NOT DETECTED Final   Stenotrophomonas maltophilia NOT DETECTED NOT DETECTED Final   Candida albicans NOT DETECTED NOT DETECTED Final   Candida auris NOT DETECTED NOT DETECTED Final   Candida glabrata NOT DETECTED NOT DETECTED Final   Candida krusei NOT DETECTED NOT DETECTED Final   Candida parapsilosis NOT DETECTED NOT DETECTED Final   Candida tropicalis NOT DETECTED NOT DETECTED Final   Cryptococcus neoformans/gattii NOT DETECTED NOT DETECTED Final   Methicillin resistance mecA/C DETECTED (A) NOT  DETECTED Final    Comment: CRITICAL RESULT CALLED TO, READ BACK BY AND VERIFIED WITH: L POINTDEXTER  PHARMD 12/14/2023 0521 BY DD Performed at [facility], [address], [place], [place] [address]   Resp panel by RT-PCR (RSV, Flu A&B, Covid) Anterior Nasal Swab     Status: None   Collection Time: 12/12/23  8:07 PM   Specimen: Anterior Nasal Swab  Result Value Ref Range Status   SARS Coronavirus 2 by RT PCR NEGATIVE NEGATIVE Final    Comment: (NOTE) SARS-CoV-2 target nucleic acids are NOT DETECTED.  The SARS-CoV-2 RNA is generally detectable in upper respiratory specimens during the acute phase of infection. The lowest concentration of SARS-CoV-2 viral copies this assay can detect is 138 copies/mL. A negative result does not preclude SARS-Cov-2 infection and should not be used as the sole basis for treatment or other patient management decisions. A negative result may occur with  improper specimen collection/handling, submission of specimen other than nasopharyngeal swab, presence of viral mutation(s) within the areas targeted by this assay, and inadequate number of viral copies(<138 copies/mL). A  negative result must be combined with clinical observations, patient history, and epidemiological information. The expected result is Negative.  Fact Sheet for Patients:  BloggerCourse.com  Fact Sheet for Healthcare Providers:  SeriousBroker.it  This test is no t yet approved or cleared by the [place]  FDA and  has been authorized for detection and/or diagnosis of SARS-CoV-2 by FDA under an Emergency Use Authorization (EUA). This EUA will remain  in effect (meaning this test can be used) for the duration of the COVID-19 declaration under Section 564(b)(1) of the Act, 21 U.S.C.section 360bbb-3(b)(1), unless the authorization is terminated  or revoked sooner.       Influenza A by PCR NEGATIVE NEGATIVE Final   Influenza B by PCR NEGATIVE NEGATIVE Final    Comment: (NOTE) The Xpert Xpress SARS-CoV-2/FLU/RSV plus assay is intended as an aid in the diagnosis of influenza from Nasopharyngeal swab specimens and should not be used as a sole basis for treatment. Nasal washings and aspirates are unacceptable for Xpert Xpress SARS-CoV-2/FLU/RSV testing.  Fact Sheet for Patients: BloggerCourse.com  Fact Sheet for Healthcare Providers: SeriousBroker.it  This test is not yet approved or cleared by the [place]  FDA and has been authorized for detection and/or diagnosis of SARS-CoV-2 by FDA under an Emergency Use Authorization (EUA). This EUA will remain in effect (meaning this test can be used) for the duration of the COVID-19 declaration under Section 564(b)(1) of the Act, 21 U.S.C. section 360bbb-3(b)(1), unless the authorization is terminated or revoked.     Resp Syncytial Virus by PCR NEGATIVE NEGATIVE Final    Comment: (NOTE) Fact Sheet for Patients: BloggerCourse.com  Fact Sheet for Healthcare  Providers: SeriousBroker.it  This test is not yet approved or cleared by the [place]  FDA and has been authorized for detection and/or diagnosis of SARS-CoV-2 by FDA under an Emergency Use Authorization (EUA). This EUA will remain in effect (meaning this test can be used) for the duration of the COVID-19 declaration under Section 564(b)(1) of the Act, 21 U.S.C. section 360bbb-3(b)(1), unless the authorization is terminated or revoked.  Performed at [facility], [address] [place], [place] [address]   Blood culture (routine x 2)     Status: None   Collection Time: 12/13/23  5:52 PM   Specimen: BLOOD LEFT HAND  Result Value Ref Range Status   Specimen Description   Final  BLOOD LEFT HAND Performed at [facility], [address] [place], [place] [address]    Special Requests   Final    BOTTLES DRAWN AEROBIC AND ANAEROBIC Blood Culture adequate volume Performed at [facility], [address], [place], [place] [address]    Culture   Final    NO GROWTH 5 DAYS Performed at [facility], [address] [place], [place] [address]    Report Status 12/18/2023 FINAL  Final         Radiology Studies: CT CHEST WO CONTRAST Result Date: 12/17/2023 CLINICAL DATA:  Chronic/persisting cough.  Failed empiric treatment. EXAM: CT CHEST WITHOUT CONTRAST TECHNIQUE: Multidetector CT imaging of the chest was performed following the standard protocol without IV contrast. RADIATION DOSE REDUCTION: This exam was performed according to the departmental dose-optimization program which includes automated exposure control, adjustment of the mA and/or kV according to patient size and/or use of iterative reconstruction technique. COMPARISON:  January 20, 2014. FINDINGS: Cardiovascular: The heart is normal in size and there is no pericardial effusion. The aorta is normal in caliber. The pulmonary trunk is distended  suggesting underlying pulmonary artery hypertension. Mediastinum/Nodes: Enlarged lymph nodes are present in the mediastinum measuring up to 1.4 cm, likely reactive. No axillary lymphadenopathy is seen. Evaluation of the hila is limited due to lack of IV contrast. The thyroid gland, trachea, and esophagus are within the normal limits. Lungs/Pleura: Lung volumes are low with chronic elevation of the left diaphragm. A few ground-glass opacities are noted in the upper lobes bilaterally with strandy atelectasis/infiltrate at the lung bases. No effusion or pneumothorax is seen. Upper Abdomen: No acute abnormality. Musculoskeletal: Degenerative changes are present in the thoracic spine. Chronic fusion of the T7/T8 vertebral bodies is unchanged. No acute osseous abnormality. IMPRESSION: 1. Scattered ground-glass opacities in the lungs bilaterally, which may be infectious or inflammatory. 2. Strandy atelectasis or infiltrate at the lung bases. 3. Distended pulmonary trunk which may be associated with underlying pulmonary artery hypertension. Electronically Signed   By: Leita Birmingham M.D.   On: 12/17/2023 14:49         Scheduled Meds:  arformoterol  15 mcg Nebulization BID   budesonide (PULMICORT) nebulizer solution  0.5 mg Nebulization BID   dextromethorphan-guaiFENesin  1 tablet Oral BID   enoxaparin  (LOVENOX ) injection  50 mg Subcutaneous Q24H   ipratropium-albuterol  3 mL Nebulization Q6H   methylPREDNISolone (SOLU-MEDROL) injection  40 mg Intravenous Q24H   traZODone  50 mg Oral Once   Continuous Infusions:  promethazine  (PHENERGAN ) injection (IM or IVPB) 6.25 mg (12/17/23 1321)     LOS: 6 days    Time spent: 48 minutes spent on 12/18/2023 caring for this patient face-to-face including chart review, ordering labs/tests, documenting, discussion with nursing staff, consultants, updating family and interview/physical exam    Camellia PARAS [place], DO [facility] Available via Epic secure chat  7am-7pm After these hours, please refer to coverage provider listed on [email or web address] 12/18/2023, 10:24 AM

## 2023-12-19 ENCOUNTER — Other Ambulatory Visit (HOSPITAL_COMMUNITY): Payer: Self-pay

## 2023-12-19 DIAGNOSIS — J9621 Acute and chronic respiratory failure with hypoxia: Secondary | ICD-10-CM | POA: Diagnosis not present

## 2023-12-19 MED ORDER — PREDNISONE 10 MG PO TABS
ORAL_TABLET | ORAL | 0 refills | Status: AC
  Filled 2023-12-19: qty 40, 16d supply, fill #0

## 2023-12-19 MED ORDER — IPRATROPIUM-ALBUTEROL 0.5-2.5 (3) MG/3ML IN SOLN
3.0000 mL | Freq: Four times a day (QID) | RESPIRATORY_TRACT | 3 refills | Status: AC | PRN
  Filled 2023-12-19: qty 180, 15d supply, fill #0

## 2023-12-19 MED ORDER — BUDESONIDE-FORMOTEROL FUMARATE 160-4.5 MCG/ACT IN AERO
2.0000 | INHALATION_SPRAY | Freq: Two times a day (BID) | RESPIRATORY_TRACT | 3 refills | Status: DC
  Filled 2023-12-19: qty 10.2, 30d supply, fill #0

## 2023-12-19 MED ORDER — HYDROCODONE BIT-HOMATROP MBR 5-1.5 MG/5ML PO SOLN
5.0000 mL | Freq: Four times a day (QID) | ORAL | 0 refills | Status: DC | PRN
  Filled 2023-12-19: qty 120, 6d supply, fill #0

## 2023-12-19 MED ORDER — ALBUTEROL SULFATE HFA 108 (90 BASE) MCG/ACT IN AERS
2.0000 | INHALATION_SPRAY | Freq: Four times a day (QID) | RESPIRATORY_TRACT | 2 refills | Status: DC | PRN
Start: 2023-12-19 — End: 2024-01-31
  Filled 2023-12-19: qty 18, 30d supply, fill #0

## 2023-12-19 MED ORDER — LORAZEPAM 1 MG PO TABS
1.0000 mg | ORAL_TABLET | Freq: Three times a day (TID) | ORAL | 0 refills | Status: DC | PRN
  Filled 2023-12-19: qty 30, 10d supply, fill #0

## 2023-12-19 NOTE — TOC Transition Note (Addendum)
 Transition of Care [facility]) - Discharge Note   Patient Details  Name: Blake Lara MRN: [id number] Date of Birth: June 21, 1980  [facility] CM/SW Contact:  Bascom Service, RN Phone Number: 12/19/2023, 10:06 AM   Clinical Narrative: spoke to patient about d/c plans-d/c home. Already has home 02 @ night w/[facility] [place] aware of order for neb machine-[facility] [place]  will deliver to patient's home,they will contact patient with /f/u on delivery to home.No further CM needs.     Final next level of care: Home/Self Care Barriers to Discharge: No Barriers Identified   Patient Goals and CMS Choice Patient states their goals for this hospitalization and ongoing recovery are:: return home CMS Medicare.gov Compare Post Acute Care list provided to:: Patient Choice offered to / list presented to : Patient  ownership interest in [facility].provided to:: Patient    Discharge Placement                       Discharge Plan and Services Additional resources added to the After Visit Summary for   In-house Referral: NA Discharge Planning Services: CM Consult Post Acute Care Choice: Durable Medical Equipment          DME Arranged: Nebulizer machine DME Agency: [facility] Date DME Agency Contacted: 12/19/23 Time DME Agency Contacted: 1004 Representative spoke with at DME Agency: [facility] HH Arranged: NA HH Agency: NA        Social Drivers of Health (SDOH) Interventions SDOH Screenings   Food Insecurity: No Food Insecurity (12/14/2023)  Housing: Low Risk  (12/14/2023)  Transportation Needs: No Transportation Needs (12/14/2023)  Utilities: Not At Risk (12/14/2023)  Tobacco Use: High Risk (12/12/2023)     Readmission Risk Interventions    12/17/2023    1:45 PM  Readmission Risk Prevention Plan  Post Dischage Appt Complete  Medication Screening Complete  Transportation Screening Complete

## 2023-12-19 NOTE — Discharge Summary (Signed)
 Physician Discharge Summary  Blake Lara FMW:[id number] DOB: November 17, 1980 DOA: 12/12/2023  PCP: Shelda Atlas, MD  Admit date: 12/12/2023 Discharge date: 12/19/2023  Admitted From: Home Disposition: Home  Recommendations for Outpatient Follow-up:  Follow up with PCP in 1-2 weeks Referral placed to pulmonology outpatient for further management of his underlying COPD Started on Breztri maintenance inhaler Continue prednisone taper on discharge Continue encourage tobacco cessation/abstinence  Home Health: No Equipment/Devices: Nebulizer machine  Discharge Condition: Stable CODE STATUS: Full code Diet recommendation: Heart healthy diet  History of present illness:  Blake Lara is a 43 y.o. 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   Patient reports that last night, he started having shortness of breath and was found to have SpO2 in the 70s to 80s requiring an increase in his home O2 to 4 L [place].  He also reports associated wheezing, cough with clear sputum and chest discomfort with the constant coughing but denies any fevers, chills, headache, dizziness, chest pain or change in ileostomy output. Patient endorses chronic abdominal pain from his multiple abdominal surgeries currently on daily oxycodone  and oxymorphone.    In the ED, afebrile, RR 16-24, HR 90-100s, SBP 110-170s, SpO2 96% on 4 L [place]. Initial labs significant for WBC of 11 otherwise normal renal function and lactic acid. EKG shows sinus rhythm. CXR shows low lung volumes with streaky bibasilar opacities, favor atelectasis. Pt received IV  morphine , IV Dilaudid , IV Solu-Medrol, DuoNebs x 3, IV doxycycline  and IV Levaquin .  TRH was consulted for admission.   Hospital course:  Acute on chronic hypoxic respiratory failure COPD exacerbation Patient presenting with progressive shortness of breath, cough and wheezing.  At baseline on 2 L nasal cannula nocturnally.  Patient is afebrile, slightly elevated WC count of 11.  Chest x-ray with low lung volumes, streaky bibasilar opacities that favors atelectasis.  COVID, RSV, influenza PCR negative.  Procalcitonin within normal limits.  CT chest without contrast with scattered ground glass opacities bilateral lungs, stranding atelectasis, dilated pulmonary trunk consistent with pulmonary hypertension.  Complicated by continued tobacco use disorder and underlying anxiety.  Treated with DuoNebs, Pulmicort, Brovana and completed course of azithromycin.  Patient was started on IV steroids and will transition to prednisone taper on discharge.  Started on Breztri for maintenance inhaler.  Nebulizer machine with DuoNebs as needed for wheezing/shortness of breath.  Referral to pulmonology after discharge for COPD management.  Remains on 2-3 L nasal cannula chronically.   Anxiety Ativan 1 mg p.o. every 6 hours as needed anxiety   Spina bifida Multiple hernias s/p multiple abdominal surgeries Chronic abdominal pain S/p ileostomy Stable.  Continue home pain medication   Tobacco use disorder Reports smoking half a pack of cigarettes per day, previously quit for 10 years and resumed back in 2015. Counseled on need for complete cessation/abstinence   Morbid obesity, class III Body mass index is 44.03 kg/m.  Discharge Diagnoses:  Principal Problem:   Acute on chronic respiratory failure with hypoxia (HCC) Active Problems:   COPD exacerbation (HCC)   Obesity, Class III, BMI 40-49.9 (morbid obesity) (HCC)   Chronic abdominal pain   Spina bifida (HCC)   History of ventral hernia    Discharge  Instructions  Discharge Instructions     Call MD for:  difficulty breathing, headache or visual disturbances   Complete by: As directed    Call MD for:  extreme fatigue   Complete by: As directed    Call MD for:  persistant dizziness or light-headedness   Complete by: As directed    Call MD for:  persistant nausea and vomiting   Complete by: As directed    Call MD for:  severe uncontrolled pain   Complete by: As directed    Call MD for:  temperature >100.4   Complete by: As directed    Diet - low sodium heart healthy   Complete by: As directed    Increase activity slowly   Complete by: As directed    Pulmonary Visit   Complete by: As directed    COPD managment   Reason for referral: Other Pulmonary      Allergies as of 12/19/2023       Reactions   Ceftriaxone  Hives, Anaphylaxis   Latex Hives, Shortness Of Breath   Morphine  Hives, Shortness Of Breath   Naproxen Sodium Hives   Ibuprofen Hives   Lentil Other (See Comments)   BEANS - UNABLE TO DIGEST DUE TO ILEOSTOMY        Medication List     STOP taking these medications    doxycycline  100 MG capsule Commonly known as: VIBRAMYCIN    DSS 100 MG Caps       TAKE these medications    albuterol 108 (90 Base) MCG/ACT inhaler Commonly known as: VENTOLIN HFA Inhale 2 puffs into the lungs every 6 (six) hours as needed for wheezing or shortness of breath.   Breztri Aerosphere 160-9-4.8 MCG/ACT Aero inhaler Generic drug: budesonide-glycopyrrolate -formoterol Inhale 2 puffs into the lungs in the morning and at bedtime.   HYDROcodone bit-homatropine 5-1.5 MG/5ML syrup Commonly known as: HYCODAN Take 5 mLs by mouth every 6 (six) hours as needed for cough.   ipratropium-albuterol 0.5-2.5 (3) MG/3ML Soln Commonly known as: DUONEB Take 3 mLs by nebulization every 6 (six) hours as needed (Shortness of breath/wheezing).   LORazepam 1 MG tablet Commonly known as: Ativan Take 1 tablet (1 mg total) by mouth every 8  (eight) hours as needed for anxiety.   Oxycodone  HCl 20 MG Tabs Take 20 mg by mouth See admin instructions. Take 20 mg by mouth at 8 AM and 8 PM What changed: Another medication with the same name was removed. Continue taking this medication, and follow the directions you see here.   OXYGEN Inhale 2 L/min into the lungs at bedtime.   oxymorphone 20 MG 12 hr tablet Commonly known as: OPANA ER Take 20 mg by mouth See admin instructions. Take 20 mg by mouth at 6 AM and 6 PM   predniSONE 10 MG tablet Commonly known as: DELTASONE Take 4 tablets (40 mg total) by mouth daily for 4 days, THEN 3 tablets (30 mg total) daily for 4 days, THEN 2 tablets (20 mg total) daily for 4 days, THEN 1 tablet (10 mg total) daily for 4 days. Start taking on: December 20, 2023   Wegovy  2.4 MG/0.75ML Soaj SQ injection Generic drug: semaglutide -weight management Inject 2.4 mg into the skin every Monday. What changed: Another medication with the same name was removed. Continue taking this medication, and follow the directions you see here.               Durable Medical Equipment  (From admission, onward)  Start     Ordered   12/15/23 1529  For home use only DME Nebulizer machine  Once       Question Answer Comment  Patient needs a nebulizer to treat with the following condition COPD (chronic obstructive pulmonary disease) (HCC)   Length of Need Lifetime   Additional equipment included Administration kit      12/15/23 1529            Follow-up Information     Shelda Atlas, MD. Schedule an appointment as soon as possible for a visit in 1 week(s).   Specialty: Internal Medicine Contact information: [address] [place] [address] [phone]         [facility]. Schedule an appointment as soon as possible for a visit.   Specialty: Pulmonology Why: COPD Contact information: [address] [place]   [phone] Additional information: [address]  [address]  [place], [place] [address]               Allergies  Allergen Reactions   Ceftriaxone  Hives and Anaphylaxis   Latex Hives and Shortness Of Breath   Morphine  Hives and Shortness Of Breath   Naproxen Sodium Hives   Ibuprofen Hives   Lentil Other (See Comments)    BEANS - UNABLE TO DIGEST DUE TO ILEOSTOMY    Consultations: None   Procedures/Studies: CT CHEST WO CONTRAST Result Date: 12/17/2023 CLINICAL DATA:  Chronic/persisting cough.  Failed empiric treatment. EXAM: CT CHEST WITHOUT CONTRAST TECHNIQUE: Multidetector CT imaging of the chest was performed following the standard protocol without IV contrast. RADIATION DOSE REDUCTION: This exam was performed according to the departmental dose-optimization program which includes automated exposure control, adjustment of the mA and/or kV according to patient size and/or use of iterative reconstruction technique. COMPARISON:  January 20, 2014. FINDINGS: Cardiovascular: The heart is normal in size and there is no pericardial effusion. The aorta is normal in caliber. The pulmonary trunk is distended suggesting underlying pulmonary artery hypertension. Mediastinum/Nodes: Enlarged lymph nodes are present in the mediastinum measuring up to 1.4 cm, likely reactive. No axillary lymphadenopathy is seen. Evaluation of the hila is limited due to lack of IV contrast. The thyroid gland, trachea, and esophagus are within the normal limits. Lungs/Pleura: Lung volumes are low with chronic elevation of the left diaphragm. A few ground-glass opacities are noted in the upper lobes bilaterally with strandy atelectasis/infiltrate at the lung bases. No effusion or pneumothorax is seen. Upper Abdomen: No acute abnormality. Musculoskeletal: Degenerative changes are present in the thoracic spine. Chronic fusion of the T7/T8 vertebral bodies is unchanged. No acute osseous abnormality. IMPRESSION:  1. Scattered ground-glass opacities in the lungs bilaterally, which may be infectious or inflammatory. 2. Strandy atelectasis or infiltrate at the lung bases. 3. Distended pulmonary trunk which may be associated with underlying pulmonary artery hypertension. Electronically Signed   By: Leita Birmingham M.D.   On: 12/17/2023 14:49   DG CHEST PORT 1 VIEW Result Date: 12/15/2023 CLINICAL DATA:  Shortness of breath EXAM: PORTABLE CHEST 1 VIEW COMPARISON:  12/12/2023 FINDINGS: Cardiac shadow is stable. Elevation of left hemidiaphragm is again seen. Chronic atelectatic changes in the left base are noted. Slight increase in right basilar atelectasis is noted as well. No effusion is seen. No bony abnormality noted. IMPRESSION: Slight increase in right basilar atelectasis. Electronically Signed   By: Oneil Devonshire M.D.   On: 12/15/2023 20:51   DG Chest Port 1  View Result Date: 12/12/2023 CLINICAL DATA:  Shortness of breath.  Chest pain. EXAM: PORTABLE CHEST 1 VIEW COMPARISON:  Radiograph 05/05/2014 FINDINGS: Lung volumes are low. Stable heart size and mediastinal contours. Streaky bibasilar opacities. Bronchovascular crowding related to low lung volumes. Elevated left hemidiaphragm with associated subjacent gaseous gastric distension. No pneumothorax or large pleural effusion. IMPRESSION: 1. Low lung volumes with streaky bibasilar opacities, favor atelectasis. 2. Elevated left hemidiaphragm with subjacent gaseous gastric distension. Electronically Signed   By: Andrea Gasman M.D.   On: 12/12/2023 18:29     Subjective: Patient seen examined bedside, lying in bed.  Continues with shortness of breath but much improved.  Discussed need for complete cessation from tobacco use.  Also discussed referral to pulmonology and plan to start a maintenance Hailer for treatment of his poorly controlled COPD.  No other questions or concerns at this time.  Denies headache, no dizziness, no chest pain, no palpitations, no abdominal  pain, no fever/chills/night sweats, no nausea/vomiting/diarrhea, no focal weakness, no fatigue, no paresthesias.  No acute events overnight per nurse staff.  Discharge Exam: Vitals:   12/18/23 2207 12/19/23 0244  BP: 121/61   Pulse: 90   Resp: 18   Temp: 98.3 F (36.8 C)   SpO2: 95% 92%   Vitals:   12/18/23 2015 12/18/23 2017 12/18/23 2207 12/19/23 0244  BP:   121/61   Pulse:   90   Resp:   18   Temp:   98.3 F (36.8 C)   TempSrc:   Oral   SpO2: 93% 93% 95% 92%  Weight:      Height:        Physical Exam: GEN: NAD, alert and oriented x 3, obese HEENT: NCAT, PERRL, EOMI, sclera clear, MMM PULM: Wheezing noted bilateral upper lung fields, no crackles, normal respiratory effort without accessory muscle use, on 3 L [place] with SpO2 92% at rest  CV: RRR w/o M/G/R GI: abd soft, NTND, NABS, no R/G/M MSK: no peripheral edema    The results of significant diagnostics from this hospitalization (including imaging, microbiology, ancillary and laboratory) are listed below for reference.     Microbiology: Recent Results (from the past 240 hours)  Blood culture (routine x 2)     Status: Abnormal   Collection Time: 12/12/23  6:20 PM   Specimen: BLOOD  Result Value Ref Range Status   Specimen Description   Final    BLOOD RIGHT ANTECUBITAL IV DRAW Performed at [facility], [address] [place], [place] [address]    Special Requests   Final    BOTTLES DRAWN AEROBIC AND ANAEROBIC Blood Culture results may not be optimal due to an inadequate volume of blood received in culture bottles Performed at [facility], [address] [place], [place] [address]    Culture  Setup Time   Final    GRAM POSITIVE COCCI IN CLUSTERS AEROBIC BOTTLE ONLY CRITICAL RESULT CALLED TO, READ BACK BY AND VERIFIED WITH: L POINTDEXTER  PHARMD 12/14/2023 0521 BY DD    Culture (A)  Final    STAPHYLOCOCCUS EPIDERMIDIS THE SIGNIFICANCE OF ISOLATING THIS ORGANISM FROM A  SINGLE SET OF BLOOD CULTURES WHEN MULTIPLE SETS ARE DRAWN IS UNCERTAIN. PLEASE NOTIFY THE MICROBIOLOGY DEPARTMENT WITHIN ONE WEEK IF SPECIATION AND SENSITIVITIES ARE REQUIRED. Performed at [facility], [address] [place], [place] [address]    Report Status 12/16/2023 FINAL  Final  Blood Culture ID Panel (Reflexed)  Status: Abnormal   Collection Time: 12/12/23  6:20 PM  Result Value Ref Range Status   Enterococcus faecalis NOT DETECTED NOT DETECTED Final   Enterococcus Faecium NOT DETECTED NOT DETECTED Final   Listeria monocytogenes NOT DETECTED NOT DETECTED Final   Staphylococcus species DETECTED (A) NOT DETECTED Final    Comment: CRITICAL RESULT CALLED TO, READ BACK BY AND VERIFIED WITH: L POINTDEXTER  PHARMD 12/14/2023 0521 BY DD    Staphylococcus aureus (BCID) NOT DETECTED NOT DETECTED Final   Staphylococcus epidermidis DETECTED (A) NOT DETECTED Final    Comment: Methicillin (oxacillin) resistant coagulase negative staphylococcus. Possible blood culture contaminant (unless isolated from more than one blood culture draw or clinical case suggests pathogenicity). No antibiotic treatment is indicated for blood  culture contaminants. CRITICAL RESULT CALLED TO, READ BACK BY AND VERIFIED WITH: L POINTDEXTER  PHARMD 12/14/2023 0521 BY DD    Staphylococcus lugdunensis NOT DETECTED NOT DETECTED Final   Streptococcus species NOT DETECTED NOT DETECTED Final   Streptococcus agalactiae NOT DETECTED NOT DETECTED Final   Streptococcus pneumoniae NOT DETECTED NOT DETECTED Final   Streptococcus pyogenes NOT DETECTED NOT DETECTED Final   A.calcoaceticus-baumannii NOT DETECTED NOT DETECTED Final   Bacteroides fragilis NOT DETECTED NOT DETECTED Final   Enterobacterales NOT DETECTED NOT DETECTED Final   Enterobacter cloacae complex NOT DETECTED NOT DETECTED Final   Escherichia coli NOT DETECTED NOT DETECTED Final   Klebsiella aerogenes NOT DETECTED NOT DETECTED Final   Klebsiella oxytoca  NOT DETECTED NOT DETECTED Final   Klebsiella pneumoniae NOT DETECTED NOT DETECTED Final   Proteus species NOT DETECTED NOT DETECTED Final   Salmonella species NOT DETECTED NOT DETECTED Final   Serratia marcescens NOT DETECTED NOT DETECTED Final   Haemophilus influenzae NOT DETECTED NOT DETECTED Final   Neisseria meningitidis NOT DETECTED NOT DETECTED Final   Pseudomonas aeruginosa NOT DETECTED NOT DETECTED Final   Stenotrophomonas maltophilia NOT DETECTED NOT DETECTED Final   Candida albicans NOT DETECTED NOT DETECTED Final   Candida auris NOT DETECTED NOT DETECTED Final   Candida glabrata NOT DETECTED NOT DETECTED Final   Candida krusei NOT DETECTED NOT DETECTED Final   Candida parapsilosis NOT DETECTED NOT DETECTED Final   Candida tropicalis NOT DETECTED NOT DETECTED Final   Cryptococcus neoformans/gattii NOT DETECTED NOT DETECTED Final   Methicillin resistance mecA/C DETECTED (A) NOT DETECTED Final    Comment: CRITICAL RESULT CALLED TO, READ BACK BY AND VERIFIED WITH: L POINTDEXTER  PHARMD 12/14/2023 0521 BY DD Performed at [facility], [address], [place], [place] [address]   Resp panel by RT-PCR (RSV, Flu A&B, Covid) Anterior Nasal Swab     Status: None   Collection Time: 12/12/23  8:07 PM   Specimen: Anterior Nasal Swab  Result Value Ref Range Status   SARS Coronavirus 2 by RT PCR NEGATIVE NEGATIVE Final    Comment: (NOTE) SARS-CoV-2 target nucleic acids are NOT DETECTED.  The SARS-CoV-2 RNA is generally detectable in upper respiratory specimens during the acute phase of infection. The lowest concentration of SARS-CoV-2 viral copies this assay can detect is 138 copies/mL. A negative result does not preclude SARS-Cov-2 infection and should not be used as the sole basis for treatment or other patient management decisions. A negative result may occur with  improper specimen collection/handling, submission of specimen other than nasopharyngeal swab, presence of viral  mutation(s) within the areas targeted by this assay, and inadequate number of viral copies(<138 copies/mL). A negative result must be combined with clinical  observations, patient history, and epidemiological information. The expected result is Negative.  Fact Sheet for Patients:  BloggerCourse.com  Fact Sheet for Healthcare Providers:  SeriousBroker.it  This test is no t yet approved or cleared by the [place]  FDA and  has been authorized for detection and/or diagnosis of SARS-CoV-2 by FDA under an Emergency Use Authorization (EUA). This EUA will remain  in effect (meaning this test can be used) for the duration of the COVID-19 declaration under Section 564(b)(1) of the Act, 21 U.S.C.section 360bbb-3(b)(1), unless the authorization is terminated  or revoked sooner.       Influenza A by PCR NEGATIVE NEGATIVE Final   Influenza B by PCR NEGATIVE NEGATIVE Final    Comment: (NOTE) The Xpert Xpress SARS-CoV-2/FLU/RSV plus assay is intended as an aid in the diagnosis of influenza from Nasopharyngeal swab specimens and should not be used as a sole basis for treatment. Nasal washings and aspirates are unacceptable for Xpert Xpress SARS-CoV-2/FLU/RSV testing.  Fact Sheet for Patients: BloggerCourse.com  Fact Sheet for Healthcare Providers: SeriousBroker.it  This test is not yet approved or cleared by the [place]  FDA and has been authorized for detection and/or diagnosis of SARS-CoV-2 by FDA under an Emergency Use Authorization (EUA). This EUA will remain in effect (meaning this test can be used) for the duration of the COVID-19 declaration under Section 564(b)(1) of the Act, 21 U.S.C. section 360bbb-3(b)(1), unless the authorization is terminated or revoked.     Resp Syncytial Virus by PCR NEGATIVE NEGATIVE Final    Comment: (NOTE) Fact Sheet for  Patients: BloggerCourse.com  Fact Sheet for Healthcare Providers: SeriousBroker.it  This test is not yet approved or cleared by the [place]  FDA and has been authorized for detection and/or diagnosis of SARS-CoV-2 by FDA under an Emergency Use Authorization (EUA). This EUA will remain in effect (meaning this test can be used) for the duration of the COVID-19 declaration under Section 564(b)(1) of the Act, 21 U.S.C. section 360bbb-3(b)(1), unless the authorization is terminated or revoked.  Performed at [facility], [address], [place], [place] [address]   Blood culture (routine x 2)     Status: None   Collection Time: 12/13/23  5:52 PM   Specimen: BLOOD LEFT HAND  Result Value Ref Range Status   Specimen Description   Final    BLOOD LEFT HAND Performed at [facility], [address] [place], [place] [address]    Special Requests   Final    BOTTLES DRAWN AEROBIC AND ANAEROBIC Blood Culture adequate volume Performed at [facility], [address] [place], [place] [address]    Culture   Final    NO GROWTH 5 DAYS Performed at [facility], [address], [place], [place] [address]    Report Status 12/18/2023 FINAL  Final     Labs: BNP (last 3 results) No results for input(s): BNP in the last 8760 hours. Basic Metabolic Panel: Recent Labs  Lab 12/12/23 1750 12/13/23 0500 12/14/23 0539 12/18/23 0531  NA 137 139 138 139  K 4.4 4.7 4.3 4.2  CL 99 100 99 100  CO2 27 29 31  32  GLUCOSE 118* 159* 140* 90  BUN 10 12 19 20   CREATININE 0.76 0.76 0.83 0.72  CALCIUM 9.3 9.3 9.2 8.5*  MG  --   --  2.3  --    Liver Function Tests: Recent Labs  Lab 12/13/23 0500  AST 21  ALT 18  ALKPHOS 93  BILITOT 0.5  PROT 7.8  ALBUMIN 3.8   No results for input(s): LIPASE, AMYLASE in the last 168 hours. No results for input(s): AMMONIA in the last 168  hours. CBC: Recent Labs  Lab 12/12/23 1750 12/13/23 0500 12/14/23 0539 12/18/23 0531  WBC 11.0* 8.1 12.5* 15.2*  NEUTROABS  --   --  9.3*  --   HGB 15.5 14.9 14.9 15.2  HCT 48.4 45.8 46.9 48.5  MCV 91.0 90.9 93.8 93.6  PLT 259 243 241 251   Cardiac Enzymes: No results for input(s): CKTOTAL, CKMB, CKMBINDEX, TROPONINI in the last 168 hours. BNP: Invalid input(s): POCBNP CBG: Recent Labs  Lab 12/17/23 0748  GLUCAP 105*   D-Dimer No results for input(s): DDIMER in the last 72 hours. Hgb A1c No results for input(s): HGBA1C in the last 72 hours. Lipid Profile No results for input(s): CHOL, HDL, LDLCALC, TRIG, CHOLHDL, LDLDIRECT in the last 72 hours. Thyroid function studies No results for input(s): TSH, T4TOTAL, T3FREE, THYROIDAB in the last 72 hours.  Invalid input(s): FREET3 Anemia work up No results for input(s): VITAMINB12, FOLATE, FERRITIN, TIBC, IRON, RETICCTPCT in the last 72 hours. Urinalysis    Component Value Date/Time   COLORURINE YELLOW 01/20/2014 1304   APPEARANCEUR CLEAR 01/20/2014 1304   LABSPEC 1.008 01/20/2014 1304   PHURINE 8.0 01/20/2014 1304   GLUCOSEU NEGATIVE 01/20/2014 1304   HGBUR LARGE (A) 01/20/2014 1304   BILIRUBINUR NEGATIVE 01/20/2014 1304   KETONESUR NEGATIVE 01/20/2014 1304   PROTEINUR 30 (A) 01/20/2014 1304   UROBILINOGEN 0.2 01/20/2014 1304   NITRITE NEGATIVE 01/20/2014 1304   LEUKOCYTESUR MODERATE (A) 01/20/2014 1304   Sepsis Labs Recent Labs  Lab 12/12/23 1750 12/13/23 0500 12/14/23 0539 12/18/23 0531  WBC 11.0* 8.1 12.5* 15.2*   Microbiology Recent Results (from the past 240 hours)  Blood culture (routine x 2)     Status: Abnormal   Collection Time: 12/12/23  6:20 PM   Specimen: BLOOD  Result Value Ref Range Status   Specimen Description   Final    BLOOD RIGHT ANTECUBITAL IV DRAW Performed at [facility], [address], [place], [place] [address]     Special Requests   Final    BOTTLES DRAWN AEROBIC AND ANAEROBIC Blood Culture results may not be optimal due to an inadequate volume of blood received in culture bottles Performed at [facility], [address] [place], [place] [address]    Culture  Setup Time   Final    GRAM POSITIVE COCCI IN CLUSTERS AEROBIC BOTTLE ONLY CRITICAL RESULT CALLED TO, READ BACK BY AND VERIFIED WITH: L POINTDEXTER  PHARMD 12/14/2023 0521 BY DD    Culture (A)  Final    STAPHYLOCOCCUS EPIDERMIDIS THE SIGNIFICANCE OF ISOLATING THIS ORGANISM FROM A SINGLE SET OF BLOOD CULTURES WHEN MULTIPLE SETS ARE DRAWN IS UNCERTAIN. PLEASE NOTIFY THE MICROBIOLOGY DEPARTMENT WITHIN ONE WEEK IF SPECIATION AND SENSITIVITIES ARE REQUIRED. Performed at [facility], [address], [place], [place] [address]    Report Status 12/16/2023 FINAL  Final  Blood Culture ID Panel (Reflexed)     Status: Abnormal   Collection Time: 12/12/23  6:20 PM  Result Value Ref Range Status   Enterococcus faecalis NOT DETECTED NOT DETECTED Final   Enterococcus Faecium NOT DETECTED NOT DETECTED Final   Listeria monocytogenes NOT DETECTED NOT DETECTED Final   Staphylococcus species DETECTED (A) NOT DETECTED Final    Comment: CRITICAL RESULT CALLED TO, READ BACK  BY AND VERIFIED WITH: L POINTDEXTER  PHARMD 12/14/2023 0521 BY DD    Staphylococcus aureus (BCID) NOT DETECTED NOT DETECTED Final   Staphylococcus epidermidis DETECTED (A) NOT DETECTED Final    Comment: Methicillin (oxacillin) resistant coagulase negative staphylococcus. Possible blood culture contaminant (unless isolated from more than one blood culture draw or clinical case suggests pathogenicity). No antibiotic treatment is indicated for blood  culture contaminants. CRITICAL RESULT CALLED TO, READ BACK BY AND VERIFIED WITH: L POINTDEXTER  PHARMD 12/14/2023 0521 BY DD    Staphylococcus lugdunensis NOT DETECTED NOT DETECTED Final   Streptococcus species NOT  DETECTED NOT DETECTED Final   Streptococcus agalactiae NOT DETECTED NOT DETECTED Final   Streptococcus pneumoniae NOT DETECTED NOT DETECTED Final   Streptococcus pyogenes NOT DETECTED NOT DETECTED Final   A.calcoaceticus-baumannii NOT DETECTED NOT DETECTED Final   Bacteroides fragilis NOT DETECTED NOT DETECTED Final   Enterobacterales NOT DETECTED NOT DETECTED Final   Enterobacter cloacae complex NOT DETECTED NOT DETECTED Final   Escherichia coli NOT DETECTED NOT DETECTED Final   Klebsiella aerogenes NOT DETECTED NOT DETECTED Final   Klebsiella oxytoca NOT DETECTED NOT DETECTED Final   Klebsiella pneumoniae NOT DETECTED NOT DETECTED Final   Proteus species NOT DETECTED NOT DETECTED Final   Salmonella species NOT DETECTED NOT DETECTED Final   Serratia marcescens NOT DETECTED NOT DETECTED Final   Haemophilus influenzae NOT DETECTED NOT DETECTED Final   Neisseria meningitidis NOT DETECTED NOT DETECTED Final   Pseudomonas aeruginosa NOT DETECTED NOT DETECTED Final   Stenotrophomonas maltophilia NOT DETECTED NOT DETECTED Final   Candida albicans NOT DETECTED NOT DETECTED Final   Candida auris NOT DETECTED NOT DETECTED Final   Candida glabrata NOT DETECTED NOT DETECTED Final   Candida krusei NOT DETECTED NOT DETECTED Final   Candida parapsilosis NOT DETECTED NOT DETECTED Final   Candida tropicalis NOT DETECTED NOT DETECTED Final   Cryptococcus neoformans/gattii NOT DETECTED NOT DETECTED Final   Methicillin resistance mecA/C DETECTED (A) NOT DETECTED Final    Comment: CRITICAL RESULT CALLED TO, READ BACK BY AND VERIFIED WITH: L POINTDEXTER  PHARMD 12/14/2023 0521 BY DD Performed at [facility], [address] [place], [place] [address]   Resp panel by RT-PCR (RSV, Flu A&B, Covid) Anterior Nasal Swab     Status: None   Collection Time: 12/12/23  8:07 PM   Specimen: Anterior Nasal Swab  Result Value Ref Range Status   SARS Coronavirus 2 by RT PCR NEGATIVE NEGATIVE Final    Comment:  (NOTE) SARS-CoV-2 target nucleic acids are NOT DETECTED.  The SARS-CoV-2 RNA is generally detectable in upper respiratory specimens during the acute phase of infection. The lowest concentration of SARS-CoV-2 viral copies this assay can detect is 138 copies/mL. A negative result does not preclude SARS-Cov-2 infection and should not be used as the sole basis for treatment or other patient management decisions. A negative result may occur with  improper specimen collection/handling, submission of specimen other than nasopharyngeal swab, presence of viral mutation(s) within the areas targeted by this assay, and inadequate number of viral copies(<138 copies/mL). A negative result must be combined with clinical observations, patient history, and epidemiological information. The expected result is Negative.  Fact Sheet for Patients:  BloggerCourse.com  Fact Sheet for Healthcare Providers:  SeriousBroker.it  This test is no t yet approved or cleared by the [place]  FDA and  has been authorized for detection and/or diagnosis of SARS-CoV-2 by FDA under an Emergency Use Authorization (EUA). This EUA  will remain  in effect (meaning this test can be used) for the duration of the COVID-19 declaration under Section 564(b)(1) of the Act, 21 U.S.C.section 360bbb-3(b)(1), unless the authorization is terminated  or revoked sooner.       Influenza A by PCR NEGATIVE NEGATIVE Final   Influenza B by PCR NEGATIVE NEGATIVE Final    Comment: (NOTE) The Xpert Xpress SARS-CoV-2/FLU/RSV plus assay is intended as an aid in the diagnosis of influenza from Nasopharyngeal swab specimens and should not be used as a sole basis for treatment. Nasal washings and aspirates are unacceptable for Xpert Xpress SARS-CoV-2/FLU/RSV testing.  Fact Sheet for Patients: BloggerCourse.com  Fact Sheet for Healthcare  Providers: SeriousBroker.it  This test is not yet approved or cleared by the [place]  FDA and has been authorized for detection and/or diagnosis of SARS-CoV-2 by FDA under an Emergency Use Authorization (EUA). This EUA will remain in effect (meaning this test can be used) for the duration of the COVID-19 declaration under Section 564(b)(1) of the Act, 21 U.S.C. section 360bbb-3(b)(1), unless the authorization is terminated or revoked.     Resp Syncytial Virus by PCR NEGATIVE NEGATIVE Final    Comment: (NOTE) Fact Sheet for Patients: BloggerCourse.com  Fact Sheet for Healthcare Providers: SeriousBroker.it  This test is not yet approved or cleared by the [place]  FDA and has been authorized for detection and/or diagnosis of SARS-CoV-2 by FDA under an Emergency Use Authorization (EUA). This EUA will remain in effect (meaning this test can be used) for the duration of the COVID-19 declaration under Section 564(b)(1) of the Act, 21 U.S.C. section 360bbb-3(b)(1), unless the authorization is terminated or revoked.  Performed at [facility], [address], [place], [place] [address]   Blood culture (routine x 2)     Status: None   Collection Time: 12/13/23  5:52 PM   Specimen: BLOOD LEFT HAND  Result Value Ref Range Status   Specimen Description   Final    BLOOD LEFT HAND Performed at [facility], [address], [place], [place] [address]    Special Requests   Final    BOTTLES DRAWN AEROBIC AND ANAEROBIC Blood Culture adequate volume Performed at [facility], [address] [place], [place] [address]    Culture   Final    NO GROWTH 5 DAYS Performed at [facility], [address], [place], [place] [address]    Report Status 12/18/2023 FINAL  Final     Time coordinating discharge: Over 30 minutes  SIGNED:   Camellia PARAS [place],  DO  [facility] 12/19/2023, 10:04 AM

## 2023-12-19 NOTE — Plan of Care (Signed)
  Problem: Education: Goal: Knowledge of General Education information will improve Description: Including pain rating scale, medication(s)/side effects and non-pharmacologic comfort measures Outcome: Progressing   Problem: Clinical Measurements: Goal: Diagnostic test results will improve Outcome: Progressing   Problem: Nutrition: Goal: Adequate nutrition will be maintained Outcome: Progressing   Problem: Pain Managment: Goal: General experience of comfort will improve and/or be controlled Outcome: Progressing   Problem: Safety: Goal: Ability to remain free from injury will improve Outcome: Progressing

## 2024-01-25 ENCOUNTER — Ambulatory Visit
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
## 2024-01-31 ENCOUNTER — Ambulatory Visit (INDEPENDENT_AMBULATORY_CARE_PROVIDER_SITE_OTHER)

## 2024-01-31 VITALS — BP 138/90 | HR 88 | Ht 59.0 in | Wt 228.0 lb

## 2024-01-31 DIAGNOSIS — Z6841 Body Mass Index (BMI) 40.0 and over, adult: Secondary | ICD-10-CM

## 2024-01-31 DIAGNOSIS — Z87891 Personal history of nicotine dependence: Secondary | ICD-10-CM | POA: Diagnosis not present

## 2024-01-31 DIAGNOSIS — R0600 Dyspnea, unspecified: Secondary | ICD-10-CM

## 2024-01-31 MED ORDER — ALBUTEROL SULFATE HFA 108 (90 BASE) MCG/ACT IN AERS: 2.0000 | INHALATION_SPRAY | Freq: Four times a day (QID) | RESPIRATORY_TRACT | 6 refills | Status: AC | PRN

## 2024-01-31 MED ORDER — BUDESONIDE-FORMOTEROL FUMARATE 160-4.5 MCG/ACT IN AERO: 2.0000 | INHALATION_SPRAY | Freq: Two times a day (BID) | RESPIRATORY_TRACT | 6 refills | Status: DC

## 2024-01-31 NOTE — Patient Instructions (Addendum)
 Dear Mr. Arons;   Given your shortness of breath, I will recommend the following: -Symbicort  2 puffs twice a day  -Albuterol  as need for shortness of breath  I will perform the following test: -Pulmonary function test -Echocardiogram -Evaluation of diaphragm   INHALER INSTRUCTIONS: To use the inhaler you follow these steps: Prime the inhaler according to instructions (which means waste between 1-4 doses before next use).  Follow package insert Shake the inhaler before each use Connect spacer Exhale completely by blowing all the air out of your lungs Seal your mouth around the spacer Press down on the canister then inhale SLOW and STEADY until your fill your lungs completely. Hold the breath for 6-10 seconds Gently exhale Wait 60 seconds then repeat steps 2-8. Rinse the mouth after each use to prevent thrush  Your pharmacist can also review proper technique if you have any remaining questions. Let me know if the cost is too high, your insurance may be able to recommend a more affordable option for you.

## 2024-02-03 ENCOUNTER — Inpatient Hospital Stay (HOSPITAL_COMMUNITY): Admission: RE | Admit: 2024-02-03

## 2024-02-08 ENCOUNTER — Telehealth: Payer: Self-pay

## 2024-02-08 NOTE — Telephone Encounter (Signed)
 Spoke with patient regarding the Friday 02/06/24 10:30 am Sniff test scheduled at [facility] time is 10:15 am-1st floor registration desk for check in, patient also has an Echocardiogram appointment at 11:30 am---patient instructed to inform the registration desk of both of his appointments.  He voiced his understanding

## 2024-02-20 ENCOUNTER — Other Ambulatory Visit (HOSPITAL_COMMUNITY)

## 2024-02-21 ENCOUNTER — Encounter (HOSPITAL_COMMUNITY): Payer: Self-pay

## 2024-02-21 ENCOUNTER — Inpatient Hospital Stay (HOSPITAL_COMMUNITY)
Admission: EM | Admit: 2024-02-21 | Discharge: 2024-02-25 | DRG: 189 | Disposition: A | Discharge status: Home / Self Care | Attending: Internal Medicine | Admitting: Internal Medicine

## 2024-02-21 ENCOUNTER — Emergency Department (HOSPITAL_COMMUNITY)

## 2024-02-21 ENCOUNTER — Other Ambulatory Visit: Payer: Self-pay

## 2024-02-21 DIAGNOSIS — Z79891 Long term (current) use of opiate analgesic: Secondary | ICD-10-CM | POA: Diagnosis not present

## 2024-02-21 DIAGNOSIS — Z91040 Latex allergy status: Secondary | ICD-10-CM | POA: Diagnosis not present

## 2024-02-21 DIAGNOSIS — J9621 Acute and chronic respiratory failure with hypoxia: Principal | ICD-10-CM | POA: Diagnosis present

## 2024-02-21 DIAGNOSIS — R109 Unspecified abdominal pain: Secondary | ICD-10-CM | POA: Diagnosis present

## 2024-02-21 DIAGNOSIS — J9622 Acute and chronic respiratory failure with hypercapnia: Secondary | ICD-10-CM | POA: Diagnosis not present

## 2024-02-21 DIAGNOSIS — F419 Anxiety disorder, unspecified: Secondary | ICD-10-CM | POA: Diagnosis present

## 2024-02-21 DIAGNOSIS — J441 Chronic obstructive pulmonary disease with (acute) exacerbation: Principal | ICD-10-CM | POA: Diagnosis present

## 2024-02-21 DIAGNOSIS — G8929 Other chronic pain: Secondary | ICD-10-CM | POA: Diagnosis present

## 2024-02-21 DIAGNOSIS — Z91018 Allergy to other foods: Secondary | ICD-10-CM

## 2024-02-21 DIAGNOSIS — B9789 Other viral agents as the cause of diseases classified elsewhere: Secondary | ICD-10-CM | POA: Diagnosis present

## 2024-02-21 DIAGNOSIS — G4733 Obstructive sleep apnea (adult) (pediatric): Secondary | ICD-10-CM | POA: Diagnosis not present

## 2024-02-21 DIAGNOSIS — E8770 Fluid overload, unspecified: Secondary | ICD-10-CM | POA: Diagnosis present

## 2024-02-21 DIAGNOSIS — K219 Gastro-esophageal reflux disease without esophagitis: Secondary | ICD-10-CM | POA: Diagnosis present

## 2024-02-21 DIAGNOSIS — Z6841 Body Mass Index (BMI) 40.0 and over, adult: Secondary | ICD-10-CM

## 2024-02-21 DIAGNOSIS — Z9981 Dependence on supplemental oxygen: Secondary | ICD-10-CM

## 2024-02-21 DIAGNOSIS — Z79899 Other long term (current) drug therapy: Secondary | ICD-10-CM | POA: Diagnosis not present

## 2024-02-21 DIAGNOSIS — Z993 Dependence on wheelchair: Secondary | ICD-10-CM | POA: Diagnosis not present

## 2024-02-21 DIAGNOSIS — R0602 Shortness of breath: Secondary | ICD-10-CM | POA: Diagnosis not present

## 2024-02-21 DIAGNOSIS — F17210 Nicotine dependence, cigarettes, uncomplicated: Secondary | ICD-10-CM | POA: Diagnosis present

## 2024-02-21 DIAGNOSIS — Z7951 Long term (current) use of inhaled steroids: Secondary | ICD-10-CM

## 2024-02-21 DIAGNOSIS — Q059 Spina bifida, unspecified: Secondary | ICD-10-CM

## 2024-02-21 DIAGNOSIS — J9602 Acute respiratory failure with hypercapnia: Principal | ICD-10-CM

## 2024-02-21 DIAGNOSIS — J9601 Acute respiratory failure with hypoxia: Principal | ICD-10-CM

## 2024-02-21 DIAGNOSIS — Z888 Allergy status to other drugs, medicaments and biological substances status: Secondary | ICD-10-CM | POA: Diagnosis not present

## 2024-02-21 DIAGNOSIS — Z87442 Personal history of urinary calculi: Secondary | ICD-10-CM

## 2024-02-21 DIAGNOSIS — J9811 Atelectasis: Secondary | ICD-10-CM | POA: Diagnosis present

## 2024-02-21 LAB — CBC WITH DIFFERENTIAL/PLATELET
Abs Immature Granulocytes: 0.04 K/uL (ref 0.00–0.07)
Basophils Absolute: 0 K/uL (ref 0.0–0.1)
Basophils Relative: 0 %
Eosinophils Absolute: 0.2 K/uL (ref 0.0–0.5)
Eosinophils Relative: 2 %
HCT: 42.5 % (ref 39.0–52.0)
Hemoglobin: 13.9 g/dL (ref 13.0–17.0)
Immature Granulocytes: 0 %
Lymphocytes Relative: 22 %
Lymphs Abs: 2.1 K/uL (ref 0.7–4.0)
MCH: 30.1 pg (ref 26.0–34.0)
MCHC: 32.7 g/dL (ref 30.0–36.0)
MCV: 92 fL (ref 80.0–100.0)
Monocytes Absolute: 0.8 K/uL (ref 0.1–1.0)
Monocytes Relative: 8 %
Neutro Abs: 6.4 K/uL (ref 1.7–7.7)
Neutrophils Relative %: 68 %
Platelets: 252 K/uL (ref 150–400)
RBC: 4.62 MIL/uL (ref 4.22–5.81)
RDW: 13.6 % (ref 11.5–15.5)
WBC: 9.5 K/uL (ref 4.0–10.5)
nRBC: 0 % (ref 0.0–0.2)

## 2024-02-21 LAB — BLOOD GAS, VENOUS
Acid-Base Excess: 5.6 mmol/L — ABNORMAL HIGH (ref 0.0–2.0)
Acid-Base Excess: 3.3 mmol/L — ABNORMAL HIGH (ref 0.0–2.0)
Bicarbonate: 36.4 mmol/L — ABNORMAL HIGH (ref 20.0–28.0)
Bicarbonate: 35.1 mmol/L — ABNORMAL HIGH (ref 20.0–28.0)
Drawn by: 75357
O2 Saturation: 100 %
O2 Saturation: 95.9 %
Patient temperature: 37
Patient temperature: 37
pCO2, Ven: 87 mmHg (ref 44–60)
pCO2, Ven: 92 mmHg (ref 44–60)
pH, Ven: 7.23 — ABNORMAL LOW (ref 7.25–7.43)
pH, Ven: 7.19 — CL (ref 7.25–7.43)
pO2, Ven: 129 mmHg — ABNORMAL HIGH (ref 32–45)
pO2, Ven: 70 mmHg — ABNORMAL HIGH (ref 32–45)

## 2024-02-21 LAB — COMPREHENSIVE METABOLIC PANEL WITH GFR
ALT: 21 U/L (ref 0–44)
AST: 25 U/L (ref 15–41)
Albumin: 3.9 g/dL (ref 3.5–5.0)
Alkaline Phosphatase: 96 U/L (ref 38–126)
Anion gap: 7 (ref 5–15)
BUN: 12 mg/dL (ref 6–20)
CO2: 32 mmol/L (ref 22–32)
Calcium: 9 mg/dL (ref 8.9–10.3)
Chloride: 101 mmol/L (ref 98–111)
Creatinine, Ser: 0.62 mg/dL (ref 0.61–1.24)
GFR, Estimated: >60 mL/min (ref >60)
Glucose, Bld: 100 mg/dL — ABNORMAL HIGH (ref 70–99)
Potassium: 4.2 mmol/L (ref 3.5–5.1)
Sodium: 140 mmol/L (ref 135–145)
Total Bilirubin: 0.6 mg/dL (ref 0.0–1.2)
Total Protein: 7.9 g/dL (ref 6.5–8.1)

## 2024-02-21 LAB — PRO BRAIN NATRIURETIC PEPTIDE: Pro Brain Natriuretic Peptide: 130 pg/mL (ref <300.0)

## 2024-02-21 LAB — TROPONIN T, HIGH SENSITIVITY
Troponin T High Sensitivity: <15 ng/L (ref 0–19)
Troponin T High Sensitivity: <15 ng/L (ref 0–19)

## 2024-02-21 LAB — RESP PANEL BY RT-PCR (RSV, FLU A&B, COVID)  RVPGX2
Influenza A by PCR: NEGATIVE
Influenza B by PCR: NEGATIVE
Resp Syncytial Virus by PCR: NEGATIVE
SARS Coronavirus 2 by RT PCR: NEGATIVE

## 2024-02-21 LAB — D-DIMER, QUANTITATIVE: D-Dimer, Quant: 0.78 ug{FEU}/mL — ABNORMAL HIGH (ref 0.00–0.50)

## 2024-02-21 LAB — LIPASE, BLOOD: Lipase: 14 U/L (ref 11–51)

## 2024-02-21 LAB — I-STAT CG4 LACTIC ACID, ED: Lactic Acid, Venous: 1 mmol/L (ref 0.5–1.9)

## 2024-02-21 MED ORDER — HYDROMORPHONE HCL 1 MG/ML IJ SOLN
0.5000 mg | INTRAMUSCULAR | Status: DC | PRN
  Administered 2024-02-21: 23:00:00 0.5 mg via INTRAVENOUS
  Filled 2024-02-21: qty 1

## 2024-02-21 MED ORDER — LORAZEPAM 2 MG/ML IJ SOLN
0.5000 mg | Freq: Once | INTRAMUSCULAR | Status: AC
  Administered 2024-02-21: 17:00:00 0.5 mg via INTRAVENOUS
  Filled 2024-02-21: qty 1

## 2024-02-21 MED ORDER — MENTHOL 3 MG MT LOZG
1.0000 | LOZENGE | OROMUCOSAL | Status: DC | PRN
  Administered 2024-02-21 – 2024-02-24 (×3): 3 mg via ORAL
  Filled 2024-02-21: qty 9
  Filled 2024-02-21: qty 18

## 2024-02-21 MED ORDER — POLYETHYLENE GLYCOL 3350 17 G PO PACK
17.0000 g | PACK | Freq: Every day | ORAL | Status: DC | PRN
  Filled 2024-02-21: qty 1

## 2024-02-21 MED ORDER — ALBUTEROL SULFATE (2.5 MG/3ML) 0.083% IN NEBU
10.0000 mg/h | INHALATION_SOLUTION | Freq: Once | RESPIRATORY_TRACT | Status: AC
  Administered 2024-02-21: 18:00:00 10 mg/h via RESPIRATORY_TRACT
  Filled 2024-02-21: qty 12

## 2024-02-21 MED ORDER — IPRATROPIUM-ALBUTEROL 0.5-2.5 (3) MG/3ML IN SOLN
3.0000 mL | Freq: Four times a day (QID) | RESPIRATORY_TRACT | Status: DC
  Administered 2024-02-22 – 2024-02-25 (×14): 3 mL via RESPIRATORY_TRACT
  Filled 2024-02-21 (×15): qty 3

## 2024-02-21 MED ORDER — METHYLPREDNISOLONE SODIUM SUCC 125 MG IJ SOLR
125.0000 mg | Freq: Once | INTRAMUSCULAR | Status: AC
  Administered 2024-02-21: 19:00:00 125 mg via INTRAVENOUS
  Filled 2024-02-21: qty 2

## 2024-02-21 MED ORDER — INSULIN ASPART 100 UNIT/ML IJ SOLN
0.0000 [IU] | INTRAMUSCULAR | Status: DC
  Filled 2024-02-21 (×2): qty 5

## 2024-02-21 MED ORDER — ENOXAPARIN SODIUM 40 MG/0.4ML IJ SOSY: 40.0000 mg | PREFILLED_SYRINGE | INTRAMUSCULAR | Status: DC

## 2024-02-21 MED ORDER — LEVOFLOXACIN IN D5W 750 MG/150ML IV SOLN
750.0000 mg | Freq: Once | INTRAVENOUS | Status: AC
  Administered 2024-02-21: 20:00:00 750 mg via INTRAVENOUS
  Filled 2024-02-21: qty 150

## 2024-02-21 MED ORDER — METHYLPREDNISOLONE SODIUM SUCC 40 MG IJ SOLR
40.0000 mg | Freq: Three times a day (TID) | INTRAMUSCULAR | Status: DC
  Administered 2024-02-21 – 2024-02-22 (×2): 40 mg via INTRAVENOUS
  Filled 2024-02-21 (×2): qty 1

## 2024-02-21 MED ORDER — LEVOFLOXACIN IN D5W 750 MG/150ML IV SOLN
750.0000 mg | INTRAVENOUS | Status: AC
  Administered 2024-02-22 – 2024-02-23 (×2): 750 mg via INTRAVENOUS
  Filled 2024-02-21 (×2): qty 150

## 2024-02-21 MED ORDER — DOCUSATE SODIUM 100 MG PO CAPS
100.0000 mg | ORAL_CAPSULE | Freq: Two times a day (BID) | ORAL | Status: DC | PRN
  Administered 2024-02-22 – 2024-02-25 (×4): 100 mg via ORAL
  Filled 2024-02-21 (×5): qty 1

## 2024-02-21 MED ORDER — IPRATROPIUM-ALBUTEROL 0.5-2.5 (3) MG/3ML IN SOLN
3.0000 mL | Freq: Once | RESPIRATORY_TRACT | Status: AC
  Administered 2024-02-21: 17:00:00 3 mL via RESPIRATORY_TRACT
  Filled 2024-02-21: qty 3

## 2024-02-21 MED ORDER — IPRATROPIUM-ALBUTEROL 0.5-2.5 (3) MG/3ML IN SOLN: 3.0000 mL | Freq: Four times a day (QID) | RESPIRATORY_TRACT | Status: DC | PRN

## 2024-02-21 MED ORDER — CHLORHEXIDINE GLUCONATE CLOTH 2 % EX PADS
6.0000 | MEDICATED_PAD | Freq: Every day | CUTANEOUS | Status: DC
  Administered 2024-02-22 – 2024-02-25 (×3): 6 via TOPICAL

## 2024-02-21 MED ORDER — HYDROMORPHONE HCL 1 MG/ML IJ SOLN
0.5000 mg | Freq: Once | INTRAMUSCULAR | Status: AC
  Administered 2024-02-21: 17:00:00 0.5 mg via INTRAVENOUS
  Filled 2024-02-21: qty 1

## 2024-02-21 MED ORDER — FUROSEMIDE 10 MG/ML IJ SOLN
40.0000 mg | Freq: Once | INTRAMUSCULAR | Status: AC
  Administered 2024-02-21: 23:00:00 40 mg via INTRAVENOUS
  Filled 2024-02-21: qty 4

## 2024-02-21 MED ORDER — IOHEXOL 350 MG/ML SOLN
75.0000 mL | Freq: Once | INTRAVENOUS | Status: AC | PRN
  Administered 2024-02-21: 18:00:00 75 mL via INTRAVENOUS

## 2024-02-21 NOTE — ED Triage Notes (Signed)
 Pt arrives to triage via wheelchair with complaints of cough, chest pain, and shortness of breath. Pt reports that he was riding in the car when it began. Chest pain is worse with coughing. Pt states that he wears 3L of oxygen at night, but not during the day.

## 2024-02-21 NOTE — ED Notes (Signed)
 I-Stat lactic acid drawn and taken to minilab.  PT requested to be removed from BiPap and placed on [place] for a few moments.  Notified patient not possible, but able to give break while giving ice, pt wanted longer. RT arrived to bedside, asked if MD had any requests to change Bipap settings.  Found Dr. Francesca and MD went to revisit patient

## 2024-02-21 NOTE — ED Notes (Signed)
 Spoke with lab to notify that VBG being sent via tube station now

## 2024-02-21 NOTE — Progress Notes (Signed)
 Rt called to room WA19 for low sats. Pt on 5 LPM [place] sats 95%. No further orders fro RT at this time.
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
## 2024-02-21 NOTE — H&P (Signed)
 NAME:  Blake Lara, MRN:  [id number], DOB:  [birth year], LOS: 0 ADMISSION DATE:  02/21/2024, CONSULTATION DATE:  02/21/24 REFERRING MD:  EDP, CHIEF COMPLAINT:  shortness of breath  History of Present Illness:  43 yo male presented with sob and cough. Pt is spina bifida pt followed by pulmonary as outpt on chronic oxygen at night. Imaging reveals possible infiltrate vs atelectasis (as to be expected) in bases. At this time pt has no wbc or fever. Is requiring supplemental oxygen/diffusely wheezing but cough has improved. In light of his status ccm was asked to admit to ICU for close airway monitoring.   I note that pulmonary saw pt in November and is proceeding with pft's as well as cardiac w/u which is appropriate. At this time we will treat pt as aecopd as well as acute HF. He is thankfully alert and oriented at this time despite his pH and co2 on vbg. Maintain NIV for now.   Pt states this has been ongoing for 24-72 hours. With worsening cough and sob, atypical from baseline. He denies fever/chills, no n/v/d no known recent sick contact. Mild cp with coughing, no dizziness. Denies hemoptysis or change in medication. He states he does comply with his oxygen use at night while sleeping. All other ROS negative.   Pertinent  Medical History  Osa Spina bifida Chronic hypoxic resp failure (2-3L at bedtime) Anxiety [facility] [facility] Events: Including procedures, antibiotic start and stop dates in addition to other pertinent events   Admitted to ICU 12/16  Interim History / Subjective:    Objective    Blood pressure (!) 153/107, pulse 98, temperature 98.5 F (36.9 C), temperature source Oral, resp. rate 18, height 4' 11 (1.499 m), weight 103.4 kg, SpO2 99%.    FiO2 (%):  [30 %] 30 %  No intake or output data in the 24 hours ending 02/21/24 2156 Filed Weights   02/21/24 2014  Weight: 103.4 kg    Examination: General: obese, on NIV reclining in bed in nad HENT: ncat  eomi, perrla, NIV in place Lungs: diffuse wheeze, diminished in bases Cardiovascular: rrr Abdomen: obese nt/nd bs+ Extremities: chronic spina bifida changes Neuro: no focal deficits from baseline anatomic  GU: deferred  Resolved problem list   Assessment and Plan  Acute on chronic hypercarbic resp failure Acute on chronic hypoxic resp failure (2-3L chronically at bedtime) Osa ?COPD with acute exacerbation Volume overload -admit to ccm with NIV support for now.  -trial off once improved but will need for sleep -lasix  dose x1 -echo in am -steroids -nebs scheduled and prn -rvp pending and strep/leg for completeness sake -per pulm note 11/25 has h/o copd with raised L hemidiaphragm. I see not pft on file.... pt had outpt echo scheduled for 19th but will order for here now.  -low threshold to d/c abx should remain afebrile and imaging improve, resp status improve and strep/leg return negative.    Labs   CBC: Recent Labs  Lab 02/21/24 1657  WBC 9.5  NEUTROABS 6.4  HGB 13.9  HCT 42.5  MCV 92.0  PLT 252    Basic Metabolic Panel: Recent Labs  Lab 02/21/24 1657  NA 140  K 4.2  CL 101  CO2 32  GLUCOSE 100*  BUN 12  CREATININE 0.62  CALCIUM 9.0   GFR: Estimated Creatinine Clearance: 117.9 mL/min (by C-G formula based on SCr of 0.62 mg/dL). Recent Labs  Lab 02/21/24 1657 02/21/24 2135  WBC 9.5  --  LATICACIDVEN  --  1.0    Liver Function Tests: Recent Labs  Lab 02/21/24 1657  AST 25  ALT 21  ALKPHOS 96  BILITOT 0.6  PROT 7.9  ALBUMIN 3.9   Recent Labs  Lab 02/21/24 1657  LIPASE 14   No results for input(s): AMMONIA in the last 168 hours.  ABG    Component Value Date/Time   PHART 7.303 (L) 01/17/2014 1533   PCO2ART 46.7 (H) 01/17/2014 1533   PO2ART 53.0 (L) 01/17/2014 1533   HCO3 35.1 (H) 02/21/2024 2050   TCO2 20 05/05/2014 1946   ACIDBASEDEF 3.5 (H) 01/17/2014 1533   O2SAT 95.9 02/21/2024 2050     Coagulation Profile: No results for  input(s): INR, PROTIME in the last 168 hours.  Cardiac Enzymes: No results for input(s): CKTOTAL, CKMB, CKMBINDEX, TROPONINI in the last 168 hours.  HbA1C: No results found for: HGBA1C  CBG: No results for input(s): GLUCAP in the last 168 hours.  Review of Systems:   As per HPI  Past Medical History:  He,  has a past medical history of Anxiety, Bimalleolar fracture of right ankle (05/05/2014), Complication of anesthesia, Contusion of chest (05/05/2014), Difficulty swallowing pills, Does not walk, Exstrophy of bladder, GERD (gastroesophageal reflux disease), History of dislocation of hip, History of exstrophy of bladder, History of kidney stones, History of pneumonia (01/2014), Ileostomy in place [facility]), and Spina bifida (HCC).   Surgical History:   Past Surgical History:  Procedure Laterality Date   ABDOMINAL SURGERY     x 16   BACK SURGERY     multiple times   BLADDER SURGERY     multiple times   CYSTOSCOPY WITH URETEROSCOPY AND STENT PLACEMENT Left 04/04/2014   Procedure: ANTEGRADE URETEROSCOPY AND WITH CATHETER  PLACEMENT LASER LITHOTRIPSY;  Surgeon: Gretel Ferrara, MD;  Location: [facility];  Service: Urology;  Laterality: Left;   HIP SURGERY     multiple times   LEG SURGERY Right    orthopedic hardware ankle, shin, femur   LEG SURGERY Left    orthopedic hardware ankle, shin   NEPHROLITHOTOMY Left 04/04/2014   Procedure:  LEFT PERCUTANEOUS NEPHROLITHOTOMY ;  Surgeon: Gretel Ferrara, MD;  Location: [facility];  Service: Urology;  Laterality: Left;   ORIF ANKLE FRACTURE Right 05/14/2014   Procedure: OPEN REDUCTION INTERNAL FIXATION (ORIF) BIMALLEOLAR ANKLE FRACTURE;  Surgeon: Eva Herring, MD;  Location: [facility];  Service: Orthopedics;  Laterality: Right;  OPEN REDUCTION INTERNAL FIXATION (ORIF) BIMALLEOLAR RIGHT ANKLE FRACTURE   PERCUTANEOUS NEPHROSTOMY Left 01/16/2014     Social History:   reports that he has quit smoking. His smoking use included cigarettes. He has a  14 pack-year smoking history. He has quit using smokeless tobacco. He reports that he does not drink alcohol and does not use drugs.   Family History:  His family history is not on file.   Allergies Allergies[1]   Home Medications  Prior to Admission medications  Medication Sig Start Date End Date Taking? Authorizing Provider  OXYGEN Inhale 3 L/min into the lungs at bedtime.   Yes [provider]  albuterol  (VENTOLIN  HFA) 108 (90 Base) MCG/ACT inhaler Inhale 2 puffs into the lungs every 6 (six) hours as needed for wheezing or shortness of breath. 01/31/24   Adrien Winfred Berke, MD  budesonide -formoterol  (SYMBICORT ) 160-4.5 MCG/ACT inhaler Inhale 2 puffs into the lungs in the morning and at bedtime. 01/31/24 03/01/24  Adrien Winfred Berke, MD  HYDROcodone  bit-homatropine [facility]) 5-1.5 MG/5ML syrup Take 5 mLs by  mouth every 6 (six) hours as needed for cough. Patient not taking: Reported on 01/31/2024 12/19/23   [place], Camellia PARAS, DO  ipratropium-albuterol  (DUONEB) 0.5-2.5 (3) MG/3ML SOLN Take 3 mLs by nebulization every 6 (six) hours as needed (Shortness of breath/wheezing). 12/19/23   [place], Camellia PARAS, DO  LORazepam  (ATIVAN ) 1 MG tablet Take 1 tablet (1 mg total) by mouth every 8 (eight) hours as needed for anxiety. Patient not taking: Reported on 01/31/2024 12/19/23 12/18/24  [place], Eric J, DO  Oxycodone  HCl 20 MG TABS Take 20 mg by mouth See admin instructions. Take 20 mg by mouth at 8 AM and 8 PM    [provider]  oxymorphone  (OPANA  ER) 20 MG 12 hr tablet Take 20 mg by mouth See admin instructions. Take 20 mg by mouth at 6 AM and 6 PM    [provider]  tiZANidine (ZANAFLEX) 4 MG tablet Take 4 mg by mouth at bedtime. 01/20/24   [provider]  WEGOVY  2.4 MG/0.75ML SOAJ SQ injection Inject 2.4 mg into the skin every Monday. Patient not taking: Reported on 01/31/2024    [provider]     Critical care time:                [1]  Allergies Allergen Reactions   Ceftriaxone  Hives and Anaphylaxis   Latex Hives and Shortness Of Breath   Morphine  Hives and Shortness Of Breath   Naproxen Sodium Hives   Ibuprofen Hives   Lentil Other (See Comments)    BEANS - UNABLE TO DIGEST DUE TO ILEOSTOMY

## 2024-02-21 NOTE — ED Notes (Signed)
 ED Provider at bedside. Notified Dr. Francesca of patient's most recent VBG results via lab call. States patient okay to have ice chips

## 2024-02-21 NOTE — Progress Notes (Addendum)
°   02/21/24 1933  BiPAP/CPAP/SIPAP  $ Non-Invasive Ventilator  Non-Invasive Vent Initial  $ Face Mask Large  Yes  BiPAP/CPAP/SIPAP Pt Type Adult  BiPAP/CPAP/SIPAP V60  Mask Type Full face mask  Dentures removed? Not applicable  Mask Size Large  Set Rate 15 breaths/min  Respiratory Rate 18 breaths/min  IPAP 12 cmH20  EPAP 6 cmH2O  FiO2 (%) 30 %  Minute Ventilation 9.8  Leak 6  Peak Inspiratory Pressure (PIP) 12  Tidal Volume (Vt) 431  Patient Home Machine No  Patient Home Mask No  Patient Home Tubing No  Auto Titrate No  Press High Alarm 25 cmH2O  Press Low Alarm 5 cmH2O  CPAP/SIPAP surface wiped down Yes  Device Plugged into RED Power Outlet Yes  BiPAP/CPAP /SiPAP Vitals  Pulse Rate 75  Resp 18  SpO2 93 %  Bilateral Breath Sounds Diminished;Expiratory wheezes  MEWS Score/Color  MEWS Score 0  MEWS Score Color Green   RT called to place patient on Bipap. Patient placed on V60 12/6 30%, doing well at this time despite feeling anxious and not wanting to wear Bipap. Continuous nebulizer placed in line- still running at this time.

## 2024-02-22 ENCOUNTER — Inpatient Hospital Stay (HOSPITAL_COMMUNITY)

## 2024-02-22 DIAGNOSIS — R0602 Shortness of breath: Secondary | ICD-10-CM

## 2024-02-22 DIAGNOSIS — Z79891 Long term (current) use of opiate analgesic: Secondary | ICD-10-CM

## 2024-02-22 DIAGNOSIS — J9622 Acute and chronic respiratory failure with hypercapnia: Secondary | ICD-10-CM

## 2024-02-22 DIAGNOSIS — F419 Anxiety disorder, unspecified: Secondary | ICD-10-CM | POA: Diagnosis not present

## 2024-02-22 DIAGNOSIS — G8929 Other chronic pain: Secondary | ICD-10-CM

## 2024-02-22 LAB — RESPIRATORY PANEL BY PCR

## 2024-02-22 LAB — HEMOGLOBIN A1C
Hgb A1c MFr Bld: 5.3 % (ref 4.8–5.6)
Mean Plasma Glucose: 105.41 mg/dL

## 2024-02-22 LAB — GLUCOSE, CAPILLARY
Glucose-Capillary: 226 mg/dL — ABNORMAL HIGH (ref 70–99)
Glucose-Capillary: 208 mg/dL — ABNORMAL HIGH (ref 70–99)
Glucose-Capillary: 156 mg/dL — ABNORMAL HIGH (ref 70–99)
Glucose-Capillary: 204 mg/dL — ABNORMAL HIGH (ref 70–99)

## 2024-02-22 LAB — CBC
HCT: 46.9 % (ref 39.0–52.0)
Hemoglobin: 15.2 g/dL (ref 13.0–17.0)
MCH: 29.9 pg (ref 26.0–34.0)
MCHC: 32.4 g/dL (ref 30.0–36.0)
MCV: 92.1 fL (ref 80.0–100.0)
Platelets: 241 K/uL (ref 150–400)
RBC: 5.09 MIL/uL (ref 4.22–5.81)
RDW: 13.8 % (ref 11.5–15.5)
WBC: 8.1 K/uL (ref 4.0–10.5)
nRBC: 0 % (ref 0.0–0.2)

## 2024-02-22 LAB — MAGNESIUM: Magnesium: 1.6 mg/dL — ABNORMAL LOW (ref 1.7–2.4)

## 2024-02-22 LAB — ECHOCARDIOGRAM COMPLETE
Area-P 1/2: 4.17 cm2
Height: 59 in
Weight: 4448 [oz_av]

## 2024-02-22 LAB — PROCALCITONIN: Procalcitonin: 0.11 ng/mL

## 2024-02-22 LAB — BASIC METABOLIC PANEL WITH GFR
Anion gap: 15 (ref 5–15)
BUN: 13 mg/dL (ref 6–20)
CO2: 28 mmol/L (ref 22–32)
Calcium: 9.3 mg/dL (ref 8.9–10.3)
Chloride: 95 mmol/L — ABNORMAL LOW (ref 98–111)
Creatinine, Ser: 0.77 mg/dL (ref 0.61–1.24)
GFR, Estimated: >60 mL/min (ref >60)
Glucose, Bld: 237 mg/dL — ABNORMAL HIGH (ref 70–99)
Potassium: 3.7 mmol/L (ref 3.5–5.1)
Sodium: 138 mmol/L (ref 135–145)

## 2024-02-22 LAB — MRSA NEXT GEN BY PCR, NASAL: MRSA by PCR Next Gen: NOT DETECTED

## 2024-02-22 MED ORDER — ORAL CARE MOUTH RINSE
15.0000 mL | OROMUCOSAL | Status: DC
  Administered 2024-02-22 – 2024-02-25 (×9): 15 mL via OROMUCOSAL

## 2024-02-22 MED ORDER — MAGNESIUM SULFATE 4 GM/100ML IV SOLN
4.0000 g | Freq: Once | INTRAVENOUS | Status: AC
  Administered 2024-02-22: 03:00:00 4 g via INTRAVENOUS
  Filled 2024-02-22: qty 100

## 2024-02-22 MED ORDER — ORAL CARE MOUTH RINSE: 15.0000 mL | OROMUCOSAL | Status: DC | PRN

## 2024-02-22 MED ORDER — HYDROMORPHONE HCL 1 MG/ML IJ SOLN
1.0000 mg | Freq: Once | INTRAMUSCULAR | Status: AC
  Administered 2024-02-22: 04:00:00 1 mg via INTRAVENOUS
  Filled 2024-02-22: qty 1

## 2024-02-22 MED ORDER — HYDROXYZINE HCL 25 MG PO TABS
25.0000 mg | ORAL_TABLET | Freq: Three times a day (TID) | ORAL | Status: DC | PRN
  Administered 2024-02-22 – 2024-02-25 (×3): 25 mg via ORAL
  Filled 2024-02-22 (×4): qty 1

## 2024-02-22 MED ORDER — ENOXAPARIN SODIUM 60 MG/0.6ML IJ SOSY
60.0000 mg | PREFILLED_SYRINGE | Freq: Every day | INTRAMUSCULAR | Status: DC
  Administered 2024-02-22 – 2024-02-25 (×4): 60 mg via SUBCUTANEOUS
  Filled 2024-02-22 (×4): qty 0.6

## 2024-02-22 MED ORDER — METHYLPREDNISOLONE SODIUM SUCC 40 MG IJ SOLR
40.0000 mg | Freq: Every day | INTRAMUSCULAR | Status: DC
  Administered 2024-02-23: 08:00:00 40 mg via INTRAVENOUS
  Filled 2024-02-22: qty 1

## 2024-02-22 MED ORDER — OXYCODONE HCL 5 MG PO TABS
10.0000 mg | ORAL_TABLET | Freq: Four times a day (QID) | ORAL | Status: DC | PRN
  Administered 2024-02-22 – 2024-02-23 (×4): 10 mg via ORAL
  Filled 2024-02-22 (×5): qty 2

## 2024-02-22 MED ORDER — PERFLUTREN LIPID MICROSPHERE
1.0000 mL | INTRAVENOUS | Status: AC | PRN
  Administered 2024-02-22: 10:00:00 3 mL via INTRAVENOUS

## 2024-02-22 MED ORDER — MORPHINE SULFATE ER 30 MG PO TBCR
30.0000 mg | EXTENDED_RELEASE_TABLET | Freq: Two times a day (BID) | ORAL | Status: DC
  Administered 2024-02-22 – 2024-02-25 (×7): 30 mg via ORAL
  Filled 2024-02-22 (×3): qty 1
  Filled 2024-02-22: qty 2
  Filled 2024-02-22: qty 1
  Filled 2024-02-22: qty 2
  Filled 2024-02-22: qty 1

## 2024-02-22 MED ORDER — HYDROMORPHONE HCL 2 MG PO TABS
2.0000 mg | ORAL_TABLET | ORAL | Status: DC | PRN
  Administered 2024-02-22 – 2024-02-23 (×7): 2 mg via ORAL
  Filled 2024-02-22 (×7): qty 1

## 2024-02-22 MED ORDER — BUDESONIDE 0.5 MG/2ML IN SUSP
0.5000 mg | Freq: Two times a day (BID) | RESPIRATORY_TRACT | Status: DC
  Administered 2024-02-22 – 2024-02-25 (×7): 0.5 mg via RESPIRATORY_TRACT
  Filled 2024-02-22 (×7): qty 2

## 2024-02-22 MED ORDER — POTASSIUM CHLORIDE CRYS ER 20 MEQ PO TBCR
40.0000 meq | EXTENDED_RELEASE_TABLET | Freq: Once | ORAL | Status: AC
  Administered 2024-02-22: 11:00:00 40 meq via ORAL
  Filled 2024-02-22 (×2): qty 2

## 2024-02-22 NOTE — Progress Notes (Signed)
 eLink Physician-Brief Progress Note Patient Name: Blake Lara DOB: Oct 08, 1980 MRN: [id number]   Date of Service  02/22/2024  HPI/Events of Note  Patient refusing CBG's.  eICU Interventions  Order canceled.        Blake Lara U Channon Ambrosini 02/22/2024, 8:11 PM

## 2024-02-22 NOTE — Progress Notes (Signed)
 Critical care attending attestation note:   I agree with the Advanced Practitioner's note, impression, and recommendations as outlined. I have taken an independent interval history, reviewed the chart and examined the patient. The following reflects my medical decision making and independent critical care time    Summary of Assessment and Plan   43 year old with spina bifida who was recently admitted for COPD exacerbation presents with hypercapnic respiratory failure with suspected recurrence of COPD exacerbation.  Follows Dr. Adrien from a group. Overnight was drowsy early on BiPAP for hypercapnic respiratory failure.  He also takes narcotics.  Currently he is alert and oriented x 3.  Off BiPAP.  On his home 2 L nasal cannula.  Pertinent Physical Exam:  General: Morbidly obese middle-age male not in distress. Lungs: Wheezing on posterior lung field auscultation. Heart: regular rate rhythm, no murmur appreciated.  Abdomen: non tender, non distended. Normal BS.  Neuro: axox3.    Labs and Radiology reviewed.  Chest x-ray: Blunting of bilateral costophrenic angle more on the left.  Atelectasis/infiltrate/effusion. CT chest 12/17/2023: Collapse/scarring in right middle and right lower lobe and left upper and lower lobe.  Some mosaic attenuation.  Mild emphysematous changes.  A/P:  Acute on chronic hypercapnic and hypoxic respiratory failure: Chronic opiate use: Chronic dyspnea with differentials of COPD versus deconditioning with morbid obesity versus others.?  Left hemidiaphragm paralysis: COPD exacerbation: -Patient's hypercapnic respiratory failure has improved.  Taken him off BiPAP..  I do not believe he is in heart failure with BNP being normal and no sign/symptom of heart failure. - Treat with COPD exacerbation: DuoNebs, Solu-Medrol  daily, Levaquin  for COPD prophylaxis. - Patient does not have pneumonia.  Checking Pro-Cal.  His infiltrate seen on the x-ray chronic with chronic  atelectasis.  - Will see if we can get sniff test in the hospital. - Echo pending.  Rest of the plan per NP note.  Transfer out of ICU.    CRITICAL CARE Performed by: Sammi JONETTA Fredericks.     Total critical care time: 31 minutes   Critical care time was exclusive of separately billable procedures and treating other patients.   Critical care was necessary to treat or prevent imminent or life-threatening deterioration.   Critical care was time spent personally by me on the following activities: development of treatment plan with patient and/or surrogate as well as nursing, discussions with consultants, evaluation of patient's response to treatment, examination of patient, obtaining history from patient or surrogate, ordering and performing treatments and interventions, ordering and review of laboratory studies, ordering and review of radiographic studies, pulse oximetry, re-evaluation of patient's condition and participation in multidisciplinary rounds.  Sammi JONETTA Fredericks, MD Pulmonary, Critical Care and Sleep Attending.  Pager: [phone]  02/22/2024, 7:49 AM

## 2024-02-22 NOTE — Progress Notes (Signed)
 Patient transported from ED to ICU room 1228 on Bipap with no complications.

## 2024-02-22 NOTE — Progress Notes (Signed)
 eLink Physician-Brief Progress Note Patient Name: Blake Lara DOB: 14-Mar-1980 MRN: [id number]   Date of Service  02/22/2024  HPI/Events of Note  Patient admitted with acute on chronic respiratory failure.  eICU Interventions  New Patient Evaluation.        [name] [name] 02/22/2024, 1:10 AM

## 2024-02-22 NOTE — Progress Notes (Signed)
 NAME:  Blake Lara, MRN:  [id number], DOB:  May 10, 1980, LOS: 1 ADMISSION DATE:  02/21/2024, CONSULTATION DATE:  02/21/24 REFERRING MD:  EDP, CHIEF COMPLAINT:  shortness of breath  History of Present Illness:  42 yo male presented with sob and cough. Pt is spina bifida pt followed by pulmonary as outpt on chronic oxygen at night. Imaging reveals possible infiltrate vs atelectasis (as to be expected) in bases. At this time pt has no wbc or fever. Is requiring supplemental oxygen/diffusely wheezing but cough has improved. In light of his status ccm was asked to admit to ICU for close airway monitoring.   I note that pulmonary saw pt in November and is proceeding with pft's as well as cardiac w/u which is appropriate. At this time we will treat pt as aecopd as well as acute HF. He is thankfully alert and oriented at this time despite his pH and co2 on vbg. Maintain NIV for now.   Pt states this has been ongoing for 24-72 hours. With worsening cough and sob, atypical from baseline. He denies fever/chills, no n/v/d no known recent sick contact. Mild cp with coughing, no dizziness. Denies hemoptysis or change in medication. He states he does comply with his oxygen use at night while sleeping. All other ROS negative.   Pertinent  Medical History  Osa Spina bifida Chronic hypoxic resp failure (2-3L at bedtime) Anxiety [facility] Events: Including procedures, antibiotic start and stop dates in addition to other pertinent events   Admitted to ICU 12/16 12/17 txf out of ICU   Interim History / Subjective:  Off BiPAP this morning    Objective    Blood pressure 112/67, pulse 85, temperature 98.5 F (36.9 C), temperature source Axillary, resp. rate (!) 23, height 4' 11 (1.499 m), weight 126.1 kg, SpO2 94%.    FiO2 (%):  [30 %] 30 %   Intake/Output Summary (Last 24 hours) at 02/22/2024 0912 Last data filed at 02/22/2024 0800 Gross per 24 hour  Intake 483.04 ml  Output --   Net 483.04 ml   Filed Weights   02/21/24 2014 02/22/24 0500  Weight: 103.4 kg 126.1 kg    Examination: General: chronically ill middle aged M NAD  HENT: NCAT pink mm  Lungs: bilat wheeze  Cardiovascular: rrr s1s2  Abdomen: soft round  GU: defer  Neuro: AAOx4   Resolved problem list   Assessment and Plan   AoC hypoxic hypercarbic resp failure Possible L diaphragm dysfunction Possible AECOPD  Chronic pain Anxiety  P -decr steroids to 40 solumedrol daily -noct BiPAP and PRN -short course levaquin  ok r/t ppx; dont think he has PNA  -add budesonide  to duoneb -RVP still pending  -PCT pending  -ECHO. Not sure if we can accomplish sniff test inpt but will see   -O2 for goal > 90  -IS, mobility  -PRN analgesia, anxiolysis   -already has an appointment with Dr. Adrien (pulm) 03/13/24 -- please keep this appointment   Stable to txf out of ICU Will ask [facility] to take over care 12/18   Labs   CBC: Recent Labs  Lab 02/21/24 1657 02/22/24 0111  WBC 9.5 8.1  NEUTROABS 6.4  --   HGB 13.9 15.2  HCT 42.5 46.9  MCV 92.0 92.1  PLT 252 241    Basic Metabolic Panel: Recent Labs  Lab 02/21/24 1657 02/22/24 0111  NA 140 138  K 4.2 3.7  CL 101 95*  CO2 32 28  GLUCOSE 100* 237*  BUN 12 13  CREATININE 0.62 0.77  CALCIUM 9.0 9.3  MG  --  1.6*   GFR: Estimated Creatinine Clearance: 133.2 mL/min (by C-G formula based on SCr of 0.77 mg/dL). Recent Labs  Lab 02/21/24 1657 02/21/24 2135 02/22/24 0111  WBC 9.5  --  8.1  LATICACIDVEN  --  1.0  --     Liver Function Tests: Recent Labs  Lab 02/21/24 1657  AST 25  ALT 21  ALKPHOS 96  BILITOT 0.6  PROT 7.9  ALBUMIN 3.9   Recent Labs  Lab 02/21/24 1657  LIPASE 14   No results for input(s): AMMONIA in the last 168 hours.  ABG    Component Value Date/Time   PHART 7.43 02/22/2024 0324   PCO2ART 49 (H) 02/22/2024 0324   PO2ART 52 (L) 02/22/2024 0324   HCO3 32.5 (H) 02/22/2024 0324   TCO2 20 05/05/2014  1946   ACIDBASEDEF 3.5 (H) 01/17/2014 1533   O2SAT 90.9 02/22/2024 0324     Coagulation Profile: No results for input(s): INR, PROTIME in the last 168 hours.  Cardiac Enzymes: No results for input(s): CKTOTAL, CKMB, CKMBINDEX, TROPONINI in the last 168 hours.  HbA1C: Hgb A1c MFr Bld  Date/Time Value Ref Range Status  02/22/2024 01:11 AM 5.3 4.8 - 5.6 % Final    Comment:    (NOTE) Diagnosis of Diabetes The following HbA1c ranges recommended by the American Diabetes Association (ADA) may be used as an aid in the diagnosis of diabetes mellitus.  Hemoglobin             Suggested A1C NGSP%              Diagnosis  <5.7                   Non Diabetic  5.7-6.4                Pre-Diabetic  >6.4                   Diabetic  <7.0                   Glycemic control for                       adults with diabetes.      CBG: Recent Labs  Lab 02/22/24 0109 02/22/24 0341 02/22/24 0807  GLUCAP 226* 208* 156*    CCT n/a    Ronnald Gave MSN, AGACNP-BC [facility] Pulmonary/Critical Care Medicine Amion for pager  02/22/2024, 9:12 AM

## 2024-02-22 NOTE — Progress Notes (Signed)
 eLink Physician-Brief Progress Note Patient Name: [name] DOB: 1980/03/14 MRN: [id number]   Date of Service  02/22/2024  HPI/Events of Note  Patient reporting 10/10 pain and requesting that his home narcotics be resumed.  eICU Interventions  Oxycodone  ordered.         Kymari Nuon U Orian Figueira 02/22/2024, 1:28 AM

## 2024-02-22 NOTE — Progress Notes (Signed)
 eLink Physician-Brief Progress Note Patient Name: Blake Lara DOB: 1980-06-17 MRN: [id number]   Date of Service  02/22/2024  HPI/Events of Note  Patient demanding large doses of iv and oral narcotics despite his admission for hypercapnic respiratory failure likely exacerbated by the sedating effects of his narcotics.  eICU Interventions  I tried to explain to the patient why the narcotic administration needs to be judicious and orally at this point, after considerable discussion he will receive one dose of iv Dilaudid  for immediate relief with subsequent narcotic dosing being exclusively oral.        Neely Kammerer U Harnoor Kohles 02/22/2024, 2:44 AM

## 2024-02-22 NOTE — Plan of Care (Signed)
   Problem: Safety: Goal: Ability to remain free from injury will improve Outcome: Progressing

## 2024-02-23 DIAGNOSIS — J441 Chronic obstructive pulmonary disease with (acute) exacerbation: Secondary | ICD-10-CM

## 2024-02-23 DIAGNOSIS — J9621 Acute and chronic respiratory failure with hypoxia: Secondary | ICD-10-CM | POA: Diagnosis not present

## 2024-02-23 MED ORDER — METHYLPREDNISOLONE SODIUM SUCC 40 MG IJ SOLR
40.0000 mg | Freq: Every day | INTRAMUSCULAR | Status: DC
  Administered 2024-02-24 – 2024-02-25 (×2): 40 mg via INTRAVENOUS
  Filled 2024-02-23 (×2): qty 1

## 2024-02-23 MED ORDER — HYDROMORPHONE HCL 1 MG/ML IJ SOLN
2.0000 mg | INTRAMUSCULAR | Status: DC | PRN
  Administered 2024-02-23 – 2024-02-25 (×14): 2 mg via INTRAVENOUS
  Filled 2024-02-23 (×16): qty 2

## 2024-02-23 MED ORDER — OXYCODONE HCL 5 MG PO TABS
20.0000 mg | ORAL_TABLET | Freq: Four times a day (QID) | ORAL | Status: AC | PRN
  Administered 2024-02-23 – 2024-02-24 (×3): 20 mg via ORAL
  Filled 2024-02-23 (×3): qty 4

## 2024-02-23 MED ORDER — HYDROCOD POLI-CHLORPHE POLI ER 10-8 MG/5ML PO SUER
5.0000 mL | Freq: Two times a day (BID) | ORAL | Status: DC
  Administered 2024-02-23 – 2024-02-25 (×3): 5 mL via ORAL
  Filled 2024-02-23 (×4): qty 115

## 2024-02-23 MED ORDER — ONDANSETRON HCL 4 MG/2ML IJ SOLN
4.0000 mg | Freq: Four times a day (QID) | INTRAMUSCULAR | Status: DC | PRN
  Administered 2024-02-23: 19:00:00 4 mg via INTRAVENOUS
  Filled 2024-02-23: qty 2

## 2024-02-23 MED ADMIN — Alprazolam Tab 0.25 MG: 0.25 mg | ORAL | @ 23:00:00 | NDC 60687037711

## 2024-02-23 MED FILL — Alprazolam Tab 0.25 MG: 0.2500 mg | ORAL | Qty: 1 | Status: AC

## 2024-02-23 NOTE — Assessment & Plan Note (Signed)
-   WC bound at baseline - no acute issues

## 2024-02-23 NOTE — Assessment & Plan Note (Addendum)
-   On chronic oxymorphone  and oxycodone  at home.  Verified on database -Currently on Dilaudid , MS contin , and oxycodone  here -Regimen modified further today -Anticipate returning to home regimen prior to discharge

## 2024-02-23 NOTE — Progress Notes (Signed)
 Progress Note    Blake Lara   FMW:[id number]  DOB: 1980/07/10  DOA: 02/21/2024     2 PCP: Shelda Atlas, MD  Initial CC: SOB  Hospital Course: Blake Lara is a 43 yo male with PMH COPD, chronic nocturnal O2 use, spina bifida who presented with shortness of breath.  He follows outpatient with pulmonology.  Due to concern for respiratory decline, he was initially admitted to the ICU for monitoring with PCCM. CT angio chest negative for PE and showed bibasilar atelectasis with reactive bilateral hilar adenopathy. VBG obtained showing 7.19/92.  He was initially managed on BiPAP with adequate improvement. Respiratory swabs returned positive for rhinovirus.  He was initiated on steroids and nebulizers.  Interval History:  Seen this morning resting in bed with significant other bedside.  Still having ongoing shortness of breath with tight breath sounds he reports.  Does get relief from Dilaudid  however requesting IV.  Assessment and Plan: * COPD exacerbation (HCC) - From rhinovirus infection - Responded well to BiPAP initially on admission and has done well since on oxygen - Short course of Levaquin  prescribed for anti-inflammatory purposes per PCCM -Continue Solu-Medrol  -Continue budesonide  and DuoNebs -Dilaudid  changed to IV; okay for use with air hunger as well  Acute on chronic respiratory failure with hypoxia (HCC) - On chronic nocturnal oxygen at home - Remains on 2 to 3 L oxygen currently.  Continue weaning as able  Chronic abdominal pain - On chronic oxymorphone  and oxycodone  at home.  Verified on database -Currently on Dilaudid , MS contin , and oxycodone  here -Regimen modified further today -Anticipate returning to home regimen prior to discharge  Spina bifida (HCC) - WC bound at baseline - no acute issues   Antimicrobials: Levaquin  02/21/2024 >> current  DVT prophylaxis:  SCDs Start: 02/21/24 2137   Code Status:   Code Status: Full Code  Mobility Assessment  (Last 72 Hours)     Mobility Assessment     Row Name 02/22/24 2200 02/22/24 2000 02/22/24 0800 02/22/24 0100     Does the patient have exclusion criteria? Yes- Order to exclude patient from mobility protocol (i.e. TCTS) Yes- Order to exclude patient from mobility protocol (i.e. TCTS) No- Perform mobility assessment No- Perform mobility assessment    What is the highest level of mobility based on the mobility assessment? Level 2 (Chairfast) - Balance while sitting on edge of bed and cannot stand Level 2 (Chairfast) - Balance while sitting on edge of bed and cannot stand Level 2 (Chairfast) - Balance while sitting on edge of bed and cannot stand Level 2 (Chairfast) - Balance while sitting on edge of bed and cannot stand    Is the above level different from baseline mobility prior to current illness? Yes - Recommend PT order Yes - Recommend PT order Yes - Recommend PT order Yes - Recommend PT order       Diet: Diet Orders (From admission, onward)     Start     Ordered   02/22/24 0804  Diet regular Room service appropriate? Yes; Fluid consistency: Thin  Diet effective now       Question Answer Comment  Room service appropriate? Yes   Fluid consistency: Thin      02/22/24 0803            Barriers to discharge: None Disposition Plan: Home HH orders placed: N/A Status is: Inpatient  Objective: Blood pressure 130/71, pulse 74, temperature 98.1 F (36.7 C), temperature source Oral, resp. rate 18, height 4' 11 (  1.499 m), weight 129.2 kg, SpO2 93%.  Examination:  Physical Exam Constitutional:      General: He is not in acute distress.    Appearance: Normal appearance. He is obese.  HENT:     Head: Normocephalic and atraumatic.     Mouth/Throat:     Mouth: Mucous membranes are moist.  Eyes:     Extraocular Movements: Extraocular movements intact.  Cardiovascular:     Rate and Rhythm: Normal rate and regular rhythm.  Pulmonary:     Effort: Pulmonary effort is normal. No  respiratory distress.     Breath sounds: Decreased air movement present. Wheezing (scattered) present.  Abdominal:     General: Bowel sounds are normal. There is no distension.     Palpations: Abdomen is soft.     Tenderness: There is no abdominal tenderness.  Musculoskeletal:        General: Normal range of motion.     Cervical back: Normal range of motion and neck supple.     Right lower leg: Edema (chronic appearing 1+) present.     Left lower leg: Edema (chronic appearing 1+) present.  Skin:    General: Skin is warm and dry.  Neurological:     General: No focal deficit present.     Mental Status: He is alert.  Psychiatric:        Mood and Affect: Mood normal.        Behavior: Behavior normal.      Consultants:  Pulmonology  Procedures:    Data Reviewed: Results for orders placed or performed during the hospital encounter of 02/21/24 (from the past 24 hours)  Glucose, capillary     Status: Abnormal   Collection Time: 02/22/24  7:29 PM  Result Value Ref Range   Glucose-Capillary 204 (H) 70 - 99 mg/dL    I have reviewed pertinent nursing notes, vitals, labs, and images as necessary. I have ordered labwork to follow up on as indicated.  I have reviewed the last notes from staff over past 24 hours. I have discussed patient's care plan and test results with nursing staff, CM/SW, and other staff as appropriate.  Old records reviewed in assessment of this patient  Time spent: Greater than 50% of the 55 minute visit was spent in counseling/coordination of care for the patient as laid out in the A&P.   LOS: 2 days   Alm Apo, MD [facility] 02/23/2024, 12:31 PM

## 2024-02-23 NOTE — Assessment & Plan Note (Signed)
-   On chronic nocturnal oxygen at home - Remains on 2 to 3 L oxygen currently.  Continue weaning as able - CTA chest reassuring with no PE nor signs of volume overload; BNP also normal on admission.  No cardiomegaly on CTA chest.  Echo poor quality and unable to evaluate much - Probable component of OHS contributing; I have strongly encouraged him to continue discussing weight loss options with primary care outpatient

## 2024-02-23 NOTE — Progress Notes (Signed)
 PT Cancellation Note  Patient Details Name: Blake Lara MRN: [id number] DOB: 08-05-1980   Cancelled Treatment:    Reason Eval/Treat Not Completed: Medical issues which prohibited therapy (pt reports he's not breathing well and is not able to tolerate activity at present. Will check back tomorrow.)   [name] PT 02/23/2024  Acute Rehabilitation Services  Office [phone]

## 2024-02-23 NOTE — Plan of Care (Signed)
  Problem: Education: Goal: Ability to describe self-care measures that may prevent or decrease complications (Diabetes Survival Skills Education) will improve Outcome: Progressing   Problem: Coping: Goal: Ability to adjust to condition or change in health will improve Outcome: Progressing   Problem: Health Behavior/Discharge Planning: Goal: Ability to identify and utilize available resources and services will improve Outcome: Progressing   Problem: Nutritional: Goal: Maintenance of adequate nutrition will improve Outcome: Progressing   Problem: Skin Integrity: Goal: Risk for impaired skin integrity will decrease Outcome: Progressing   Problem: Tissue Perfusion: Goal: Adequacy of tissue perfusion will improve Outcome: Progressing

## 2024-02-23 NOTE — Assessment & Plan Note (Addendum)
-   From rhinovirus infection - Responded well to BiPAP initially on admission and has done well since on oxygen - Short course of Levaquin  prescribed for anti-inflammatory purposes per PCCM -Continue Solu-Medrol  -Continue budesonide  and DuoNebs -Dilaudid  changed to IV; okay for use with air hunger as well

## 2024-02-23 NOTE — Hospital Course (Signed)
 Blake Lara is a 43 yo male with PMH COPD, chronic nocturnal O2 use, spina bifida who presented with shortness of breath.  He follows outpatient with pulmonology.  Due to concern for respiratory decline, he was initially admitted to the ICU for monitoring with PCCM. CT angio chest negative for PE and showed bibasilar atelectasis with reactive bilateral hilar adenopathy. VBG obtained showing 7.19/92.  He was initially managed on BiPAP with adequate improvement. Respiratory swabs returned positive for rhinovirus.  He was initiated on steroids and nebulizers.

## 2024-02-23 NOTE — Progress Notes (Signed)
 PT Cancellation Note  Patient Details Name: Blake Lara MRN: [id number] DOB: 11-Jan-1981   Cancelled Treatment:    Reason Eval/Treat Not Completed: Medical issues which prohibited therapy (pt reports he's feeling too poorly to attempt mobility at present. He agreed to attempt this afternoon. He transfers to a WC independently at baseline. Will follow.)  [name] PT 02/23/2024  Acute Rehabilitation Services  Office [phone]

## 2024-02-24 ENCOUNTER — Other Ambulatory Visit (HOSPITAL_COMMUNITY)

## 2024-02-24 DIAGNOSIS — J441 Chronic obstructive pulmonary disease with (acute) exacerbation: Secondary | ICD-10-CM | POA: Diagnosis not present

## 2024-02-24 DIAGNOSIS — J9621 Acute and chronic respiratory failure with hypoxia: Secondary | ICD-10-CM | POA: Diagnosis not present

## 2024-02-24 MED ORDER — OXYCODONE HCL 5 MG PO TABS
20.0000 mg | ORAL_TABLET | Freq: Four times a day (QID) | ORAL | Status: DC | PRN
  Administered 2024-02-25 (×2): 20 mg via ORAL
  Filled 2024-02-24 (×2): qty 4

## 2024-02-24 MED ORDER — GUAIFENESIN ER 600 MG PO TB12
600.0000 mg | ORAL_TABLET | Freq: Two times a day (BID) | ORAL | Status: DC
  Administered 2024-02-24 – 2024-02-25 (×3): 600 mg via ORAL
  Filled 2024-02-24 (×3): qty 1

## 2024-02-24 MED ORDER — SODIUM CHLORIDE 3 % IN NEBU: 4.0000 mL | INHALATION_SOLUTION | Freq: Three times a day (TID) | RESPIRATORY_TRACT | Status: DC | PRN

## 2024-02-24 NOTE — Progress Notes (Addendum)
 Patients O2 dropped to 79-83% while sleeping. Woke him up and went to 93-94% at 3LPM nasal cannula. Patient is anxious about his 02 dropping. Offered to give him some anxiety meds but says he wants on call to be notified. RONAL Horns NP was made aware of this and acknowledge this nurse's message.

## 2024-02-24 NOTE — Assessment & Plan Note (Signed)
-   Body mass index is 56.17 kg/m. - ongoing weight gain - insurance no longer covering [facility]  outpatient but PCP pursuing alternative if possible - trial of CPAP tonight due to some desats overnight however he says he's had 4 normal sleep studies AND wouldn't really wear a CPAP anyways if needed. He is okay with a trial of it tonight; says would consider nasal CPAP outpatient (however needs adequate diagnosis first)

## 2024-02-24 NOTE — Progress Notes (Signed)
 " Progress Note    Blake Lara   FMW:[id number]  DOB: 04-15-1980  DOA: 02/21/2024     3 PCP: Blake Atlas, MD  Initial CC: SOB  Hospital Course: Blake Lara is a 43 yo male with PMH COPD, chronic nocturnal O2 use, spina bifida who presented with shortness of breath.  He follows outpatient with pulmonology.  Due to concern for respiratory decline, he was initially admitted to the ICU for monitoring with PCCM. CT angio chest negative for PE and showed bibasilar atelectasis with reactive bilateral hilar adenopathy. VBG obtained showing 7.19/92.  He was initially managed on BiPAP with adequate improvement. Respiratory swabs returned positive for rhinovirus.  He was initiated on steroids and nebulizers.  Interval History:  Complaining of thick secretions/cough this am. Some desats overnight despite O2 also. Adding hypertonic nebs and mucinex . Trial of CPAP tonight.   Assessment and Plan: * COPD exacerbation (HCC) - From rhinovirus infection - Responded well to BiPAP initially on admission and has done well since on oxygen - Short course of Levaquin  prescribed for anti-inflammatory purposes per PCCM -Continue Solu-Medrol  -Continue budesonide  and DuoNebs -Dilaudid  changed to IV; okay for use with air hunger as well  Acute on chronic respiratory failure with hypoxia (HCC) - On chronic nocturnal oxygen at home - Remains on 2 to 3 L oxygen currently.  Continue weaning as able - CTA chest reassuring with no PE nor signs of volume overload; BNP also normal on admission.  No cardiomegaly on CTA chest.  Echo poor quality and unable to evaluate much  Chronic abdominal pain - On chronic oxymorphone  and oxycodone  at home.  Verified on database -Currently on Dilaudid , MS contin , and oxycodone  here -Regimen modified further today -Anticipate returning to home regimen prior to discharge  Morbid obesity with BMI of 50.0-59.9, adult (HCC) - Body mass index is 56.17 kg/m. - ongoing weight  gain - insurance no longer covering [facility]  outpatient but PCP pursuing alternative if possible - trial of CPAP tonight due to some desats overnight however he says he's had 4 normal sleep studies AND wouldn't really wear a CPAP anyways if needed. He is okay with a trial of it tonight; says would consider nasal CPAP outpatient (however needs adequate diagnosis first)  Spina bifida (HCC) - WC bound at baseline - no acute issues   Antimicrobials: Levaquin  02/21/2024 >> 02/23/2024  DVT prophylaxis:  SCDs Start: 02/21/24 2137   Code Status:   Code Status: Full Code  Mobility Assessment (Last 72 Hours)     Mobility Assessment     Row Name 02/23/24 2100 02/23/24 0850 02/22/24 2200 02/22/24 2000 02/22/24 0800   Does the patient have exclusion criteria? No- Perform mobility assessment No- Perform mobility assessment Yes- Order to exclude patient from mobility protocol (i.e. TCTS) Yes- Order to exclude patient from mobility protocol (i.e. TCTS) No- Perform mobility assessment   What is the highest level of mobility based on the mobility assessment? Level 2 (Chairfast) - Balance while sitting on edge of bed and cannot stand Level 2 (Chairfast) - Balance while sitting on edge of bed and cannot stand Level 2 (Chairfast) - Balance while sitting on edge of bed and cannot stand Level 2 (Chairfast) - Balance while sitting on edge of bed and cannot stand Level 2 (Chairfast) - Balance while sitting on edge of bed and cannot stand   Is the above level different from baseline mobility prior to current illness? Yes - Recommend PT order -- Yes - Recommend PT order Yes -  Recommend PT order Yes - Recommend PT order    Row Name 02/22/24 0100           Does the patient have exclusion criteria? No- Perform mobility assessment       What is the highest level of mobility based on the mobility assessment? Level 2 (Chairfast) - Balance while sitting on edge of bed and cannot stand       Is the above level  different from baseline mobility prior to current illness? Yes - Recommend PT order          Diet: Diet Orders (From admission, onward)     Start     Ordered   02/22/24 0804  Diet regular Room service appropriate? Yes; Fluid consistency: Thin  Diet effective now       Question Answer Comment  Room service appropriate? Yes   Fluid consistency: Thin      02/22/24 0803            Barriers to discharge: None Disposition Plan: Home HH orders placed: N/A Status is: Inpatient  Objective: Blood pressure (!) 165/88, pulse (!) 104, temperature 98.4 F (36.9 C), temperature source Oral, resp. rate 16, height 4' 11 (1.499 m), weight 126.1 kg, SpO2 93%.  Examination:  Physical Exam Constitutional:      General: He is not in acute distress.    Appearance: Normal appearance. He is obese.  HENT:     Head: Normocephalic and atraumatic.     Mouth/Throat:     Mouth: Mucous membranes are moist.  Eyes:     Extraocular Movements: Extraocular movements intact.  Cardiovascular:     Rate and Rhythm: Normal rate and regular rhythm.  Pulmonary:     Effort: Pulmonary effort is normal. No respiratory distress.     Breath sounds: Decreased air movement (Improved) present. No wheezing (scattered, significantly improved).  Abdominal:     General: Bowel sounds are normal. There is no distension.     Palpations: Abdomen is soft.     Tenderness: There is no abdominal tenderness.  Musculoskeletal:        General: Normal range of motion.     Cervical back: Normal range of motion and neck supple.     Right lower leg: Edema (chronic appearing 1+) present.     Left lower leg: Edema (chronic appearing 1+) present.  Skin:    General: Skin is warm and dry.  Neurological:     General: No focal deficit present.     Mental Status: He is alert.  Psychiatric:        Mood and Affect: Mood normal.        Behavior: Behavior normal.      Consultants:  Pulmonology  Procedures:    Data  Reviewed: No results found for this or any previous visit (from the past 24 hours).   I have reviewed pertinent nursing notes, vitals, labs, and images as necessary. I have ordered labwork to follow up on as indicated.  I have reviewed the last notes from staff over past 24 hours. I have discussed patient's care plan and test results with nursing staff, CM/SW, and other staff as appropriate.  Old records reviewed in assessment of this patient  Time spent: Greater than 50% of the 55 minute visit was spent in counseling/coordination of care for the patient as laid out in the A&P.   LOS: 3 days   Alm Apo, MD [facility] 02/24/2024, 1:01 PM "

## 2024-02-24 NOTE — Progress Notes (Signed)
 PT Cancellation Note  Patient Details Name: Blake Lara MRN: [id number] DOB: 05-Aug-1980   Cancelled Treatment:    Reason Eval/Treat Not Completed: Other (comment)  Unable to get to pt earlier today.  Reports feels rough and does not feel like therapy at this time.  Nurse did report could have pain meds but pt reports not just pain but feels terrible and not like therapy tonight.  Will f/u tomorrow as able.  Benjiman, PT Acute Rehab [facility] [phone]   Benjiman VEAR [place]berry 02/24/2024, 5:59 PM

## 2024-02-25 ENCOUNTER — Other Ambulatory Visit (HOSPITAL_COMMUNITY): Payer: Self-pay

## 2024-02-25 DIAGNOSIS — J9621 Acute and chronic respiratory failure with hypoxia: Secondary | ICD-10-CM | POA: Diagnosis not present

## 2024-02-25 DIAGNOSIS — J441 Chronic obstructive pulmonary disease with (acute) exacerbation: Secondary | ICD-10-CM | POA: Diagnosis not present

## 2024-02-25 MED ORDER — PREDNISONE 20 MG PO TABS
40.0000 mg | ORAL_TABLET | Freq: Every day | ORAL | 0 refills | Status: AC
  Filled 2024-02-25: qty 10, 5d supply, fill #0

## 2024-02-25 MED ORDER — HYDROCOD POLI-CHLORPHE POLI ER 10-8 MG/5ML PO SUER
5.0000 mL | Freq: Two times a day (BID) | ORAL | 0 refills | Status: AC | PRN
  Filled 2024-02-25: qty 70, 7d supply, fill #0

## 2024-02-25 NOTE — Progress Notes (Signed)
" °   02/25/24 0020  BiPAP/CPAP/SIPAP  BiPAP/CPAP/SIPAP Pt Type Adult  Reason BIPAP/CPAP not in use Non-compliant    "

## 2024-02-25 NOTE — Discharge Summary (Signed)
 " Physician Discharge Summary   Blake Lara FMW:[id number] DOB: August 30, 1980 DOA: 02/21/2024  PCP: Shelda Atlas, MD  Admit date: 02/21/2024 Discharge date: 02/25/2024  Admitted From: Home  Disposition:  Home Discharging physician: Alm Apo, MD Barriers to discharge: none  Recommendations at discharge: Continue pursuing weight loss options Follow up with pulmonology; consider repeat sleep study   Discharge Condition: stable CODE STATUS: Full  Diet recommendation:  Diet Orders (From admission, onward)     Start     Ordered   02/25/24 0000  Diet general        02/25/24 1038   02/22/24 0804  Diet regular Room service appropriate? Yes; Fluid consistency: Thin  Diet effective now       Question Answer Comment  Room service appropriate? Yes   Fluid consistency: Thin      02/22/24 0803            Hospital Course: Blake Lara is a 43 yo male with PMH COPD, chronic nocturnal O2 use, spina bifida who presented with shortness of breath.  He follows outpatient with pulmonology.  Due to concern for respiratory decline, he was initially admitted to the ICU for monitoring with PCCM. CT angio chest negative for PE and showed bibasilar atelectasis with reactive bilateral hilar adenopathy. VBG obtained showing 7.19/92.  He was initially managed on BiPAP with adequate improvement. Respiratory swabs returned positive for rhinovirus.  He was initiated on steroids and nebulizers.  Assessment and Plan: * COPD exacerbation (HCC) - From rhinovirus infection - Responded well to BiPAP initially on admission and has done well since on oxygen - Short course of Levaquin  prescribed for anti-inflammatory purposes per PCCM; completed inpatient -Treated with Solu-Medrol  during hospitalization and transitioned to prednisone  at discharge to complete course  Acute on chronic respiratory failure with hypoxia (HCC) - On chronic nocturnal oxygen at home - Remains on 2 to 3 L oxygen currently.   Continue weaning as able - CTA chest reassuring with no PE nor signs of volume overload; BNP also normal on admission.  No cardiomegaly on CTA chest.  Echo poor quality and unable to evaluate much - Probable component of OHS contributing; I have strongly encouraged him to continue discussing weight loss options with primary care outpatient  Chronic abdominal pain - Resume home regimen at discharge  Morbid obesity with BMI of 50.0-59.9, adult (HCC) - Body mass index is 56.17 kg/m. - ongoing weight gain - insurance no longer covering [facility]  outpatient but PCP pursuing alternative if possible  Spina bifida (HCC) - WC bound at baseline - no acute issues   The patient's acute and chronic medical conditions were treated accordingly. On day of discharge, patient was felt deemed stable for discharge. Patient/family member advised to call PCP or come back to ER if needed.   Principal Diagnosis: COPD exacerbation [facility])  Discharge Diagnoses: Active Hospital Problems   Diagnosis Date Noted   COPD exacerbation (HCC) 12/12/2023    Priority: 1.   Acute on chronic respiratory failure with hypoxia (HCC) 12/12/2023    Priority: 2.   Chronic abdominal pain 12/12/2023    Priority: 3.   Morbid obesity with BMI of 50.0-59.9, adult (HCC) 12/12/2023    Priority: 3.   Spina bifida [facility]) 12/12/2023    Resolved Hospital Problems  No resolved problems to display.     Discharge Instructions     Diet general   Complete by: As directed    Increase activity slowly   Complete by: As directed  Allergies as of 02/25/2024       Reactions   Ceftriaxone  Hives, Anaphylaxis   Latex Hives, Shortness Of Breath   Morphine  Hives, Shortness Of Breath   Naproxen Sodium Hives   Ibuprofen Hives   Lentil Other (See Comments)   BEANS - UNABLE TO DIGEST DUE TO ILEOSTOMY        Medication List     STOP taking these medications    HYDROcodone  bit-homatropine 5-1.5 MG/5ML syrup Commonly known as:  HYCODAN   LORazepam  1 MG tablet Commonly known as: Ativan        TAKE these medications    albuterol  108 (90 Base) MCG/ACT inhaler Commonly known as: VENTOLIN  HFA Inhale 2 puffs into the lungs every 6 (six) hours as needed for wheezing or shortness of breath.   budesonide -formoterol  160-4.5 MCG/ACT inhaler Commonly known as: SYMBICORT  Inhale 2 puffs into the lungs in the morning and at bedtime.   chlorpheniramine-HYDROcodone  10-8 MG/5ML Commonly known as: TUSSIONEX Take 5 mLs by mouth every 12 (twelve) hours as needed for cough.   docusate sodium  100 MG capsule Commonly known as: COLACE Take 100 mg by mouth daily.   ipratropium-albuterol  0.5-2.5 (3) MG/3ML Soln Commonly known as: DUONEB Take 3 mLs by nebulization every 6 (six) hours as needed (Shortness of breath/wheezing). What changed: when to take this   Oxycodone  HCl 20 MG Tabs Take 20 mg by mouth in the morning and at bedtime. 12 pm and 12 am   OXYGEN Inhale 2 L/min into the lungs at bedtime.   oxymorphone  20 MG 12 hr tablet Commonly known as: OPANA  ER Take 20 mg by mouth See admin instructions. Take 20 mg by mouth at 10AM and 10PM   predniSONE  20 MG tablet Commonly known as: DELTASONE  Take 2 tablets (40 mg total) by mouth daily with breakfast for 5 days. Start taking on: February 26, 2024   tiZANidine 4 MG tablet Commonly known as: ZANAFLEX Take 4 mg by mouth at bedtime.   Wegovy  2.4 MG/0.75ML Soaj SQ injection Generic drug: semaglutide -weight management Inject 2.4 mg into the skin every Monday.        Follow-up Information     Shelda Atlas, MD. Schedule an appointment as soon as possible for a visit in 1 week(s).   Specialty: Internal Medicine Contact information: [address] [place] [address] [phone]                Allergies[1]  Consultations: Pulmonology   Procedures:   Discharge Exam: BP 124/73 (BP Location: Right Arm)   Pulse 87   Temp 98.9 F (37.2 C)  (Oral)   Resp (!) 27   Ht 4' 11 (1.499 m)   Wt 124.6 kg   SpO2 93%   BMI 55.48 kg/m  Physical Exam Constitutional:      General: He is not in acute distress.    Appearance: Normal appearance. He is obese.  HENT:     Head: Normocephalic and atraumatic.     Mouth/Throat:     Mouth: Mucous membranes are moist.  Eyes:     Extraocular Movements: Extraocular movements intact.  Cardiovascular:     Rate and Rhythm: Normal rate and regular rhythm.  Pulmonary:     Effort: Pulmonary effort is normal. No respiratory distress.     Breath sounds: Decreased air movement (Improved) present. No wheezing (scattered, significantly improved).  Abdominal:     General: Bowel sounds are normal. There is no distension.     Palpations: Abdomen is soft.  Tenderness: There is no abdominal tenderness.  Musculoskeletal:        General: Normal range of motion.     Cervical back: Normal range of motion and neck supple.     Right lower leg: Edema (chronic appearing 1+) present.     Left lower leg: Edema (chronic appearing 1+) present.  Skin:    General: Skin is warm and dry.  Neurological:     General: No focal deficit present.     Mental Status: He is alert.  Psychiatric:        Mood and Affect: Mood normal.        Behavior: Behavior normal.      The results of significant diagnostics from this hospitalization (including imaging, microbiology, ancillary and laboratory) are listed below for reference.   Microbiology: Recent Results (from the past 240 hours)  Resp panel by RT-PCR (RSV, Flu A&B, Covid) Anterior Nasal Swab     Status: None   Collection Time: 02/21/24  6:53 PM   Specimen: Anterior Nasal Swab  Result Value Ref Range Status   SARS Coronavirus 2 by RT PCR NEGATIVE NEGATIVE Final    Comment: (NOTE) SARS-CoV-2 target nucleic acids are NOT DETECTED.  The SARS-CoV-2 RNA is generally detectable in upper respiratory specimens during the acute phase of infection. The  lowest concentration of SARS-CoV-2 viral copies this assay can detect is 138 copies/mL. A negative result does not preclude SARS-Cov-2 infection and should not be used as the sole basis for treatment or other patient management decisions. A negative result may occur with  improper specimen collection/handling, submission of specimen other than nasopharyngeal swab, presence of viral mutation(s) within the areas targeted by this assay, and inadequate number of viral copies(<138 copies/mL). A negative result must be combined with clinical observations, patient history, and epidemiological information. The expected result is Negative.  Fact Sheet for Patients:  bloggercourse.com  Fact Sheet for Healthcare Providers:  seriousbroker.it  This test is no t yet approved or cleared by the [place]  FDA and  has been authorized for detection and/or diagnosis of SARS-CoV-2 by FDA under an Emergency Use Authorization (EUA). This EUA will remain  in effect (meaning this test can be used) for the duration of the COVID-19 declaration under Section 564(b)(1) of the Act, 21 U.S.C.section 360bbb-3(b)(1), unless the authorization is terminated  or revoked sooner.       Influenza A by PCR NEGATIVE NEGATIVE Final   Influenza B by PCR NEGATIVE NEGATIVE Final    Comment: (NOTE) The Xpert Xpress SARS-CoV-2/FLU/RSV plus assay is intended as an aid in the diagnosis of influenza from Nasopharyngeal swab specimens and should not be used as a sole basis for treatment. Nasal washings and aspirates are unacceptable for Xpert Xpress SARS-CoV-2/FLU/RSV testing.  Fact Sheet for Patients: bloggercourse.com  Fact Sheet for Healthcare Providers: seriousbroker.it  This test is not yet approved or cleared by the [place]  FDA and has been authorized for detection and/or diagnosis of SARS-CoV-2 by FDA under  an Emergency Use Authorization (EUA). This EUA will remain in effect (meaning this test can be used) for the duration of the COVID-19 declaration under Section 564(b)(1) of the Act, 21 U.S.C. section 360bbb-3(b)(1), unless the authorization is terminated or revoked.     Resp Syncytial Virus by PCR NEGATIVE NEGATIVE Final    Comment: (NOTE) Fact Sheet for Patients: bloggercourse.com  Fact Sheet for Healthcare Providers: seriousbroker.it  This test is not yet approved or cleared by the [place]  FDA and has  been authorized for detection and/or diagnosis of SARS-CoV-2 by FDA under an Emergency Use Authorization (EUA). This EUA will remain in effect (meaning this test can be used) for the duration of the COVID-19 declaration under Section 564(b)(1) of the Act, 21 U.S.C. section 360bbb-3(b)(1), unless the authorization is terminated or revoked.  Performed at [facility], [address], [place], [place] [address]   Respiratory (~20 pathogens) panel by PCR     Status: Abnormal   Collection Time: 02/21/24  6:53 PM   Specimen: Nasopharyngeal Swab; Respiratory  Result Value Ref Range Status   Adenovirus NOT DETECTED NOT DETECTED Final   Coronavirus 229E NOT DETECTED NOT DETECTED Final    Comment: (NOTE) The Coronavirus on the Respiratory Panel, DOES NOT test for the novel  Coronavirus (2019 nCoV)    Coronavirus HKU1 NOT DETECTED NOT DETECTED Final   Coronavirus NL63 NOT DETECTED NOT DETECTED Final   Coronavirus OC43 NOT DETECTED NOT DETECTED Final   Metapneumovirus NOT DETECTED NOT DETECTED Final   Rhinovirus / Enterovirus DETECTED (A) NOT DETECTED Final   Influenza A NOT DETECTED NOT DETECTED Final   Influenza B NOT DETECTED NOT DETECTED Final   Parainfluenza Virus 1 NOT DETECTED NOT DETECTED Final   Parainfluenza Virus 2 NOT DETECTED NOT DETECTED Final   Parainfluenza Virus 3 NOT DETECTED NOT DETECTED Final    Parainfluenza Virus 4 NOT DETECTED NOT DETECTED Final   Respiratory Syncytial Virus NOT DETECTED NOT DETECTED Final   Bordetella pertussis NOT DETECTED NOT DETECTED Final   Bordetella Parapertussis NOT DETECTED NOT DETECTED Final   Chlamydophila pneumoniae NOT DETECTED NOT DETECTED Final   Mycoplasma pneumoniae NOT DETECTED NOT DETECTED Final    Comment: Performed at [facility], [address] [place], [place] [address]  MRSA Next Gen by PCR, Nasal     Status: None   Collection Time: 02/22/24 12:14 AM   Specimen: Nasal Mucosa; Nasal Swab  Result Value Ref Range Status   MRSA by PCR Next Gen NOT DETECTED NOT DETECTED Final    Comment: (NOTE) The GeneXpert MRSA Assay (FDA approved for NASAL specimens only), is one component of a comprehensive MRSA colonization surveillance program. It is not intended to diagnose MRSA infection nor to guide or monitor treatment for MRSA infections. Test performance is not FDA approved in patients less than 8 years old. Performed at [facility], [address], [place], [place] [address]      Labs: BNP (last 3 results) No results for input(s): BNP in the last 8760 hours. Basic Metabolic Panel: Recent Labs  Lab 02/21/24 1657 02/22/24 0111  NA 140 138  K 4.2 3.7  CL 101 95*  CO2 32 28  GLUCOSE 100* 237*  BUN 12 13  CREATININE 0.62 0.77  CALCIUM 9.0 9.3  MG  --  1.6*   Liver Function Tests: Recent Labs  Lab 02/21/24 1657  AST 25  ALT 21  ALKPHOS 96  BILITOT 0.6  PROT 7.9  ALBUMIN 3.9   Recent Labs  Lab 02/21/24 1657  LIPASE 14   No results for input(s): AMMONIA in the last 168 hours. CBC: Recent Labs  Lab 02/21/24 1657 02/22/24 0111  WBC 9.5 8.1  NEUTROABS 6.4  --   HGB 13.9 15.2  HCT 42.5 46.9  MCV 92.0 92.1  PLT 252 241   Cardiac Enzymes: No results for input(s): CKTOTAL, CKMB, CKMBINDEX, TROPONINI in the last 168 hours. BNP: Invalid input(s): POCBNP CBG: Recent Labs  Lab 02/22/24 0109 02/22/24 0341 02/22/24 0807 02/22/24 1929  GLUCAP 226* 208* 156* 204*   D-Dimer No results for input(s): DDIMER in the last 72 hours. Hgb A1c No results for input(s): HGBA1C in the last 72 hours. Lipid Profile No results for input(s): CHOL, HDL, LDLCALC, TRIG, CHOLHDL, LDLDIRECT in the last 72 hours. Thyroid function studies No results for input(s): TSH, T4TOTAL, T3FREE, THYROIDAB in the last 72 hours.  Invalid input(s): FREET3 Anemia work up No results for input(s): VITAMINB12, FOLATE, FERRITIN, TIBC, IRON, RETICCTPCT in the last 72 hours. Urinalysis    Component Value Date/Time   COLORURINE YELLOW 01/20/2014 1304   APPEARANCEUR CLEAR 01/20/2014 1304   LABSPEC 1.008 01/20/2014 1304   PHURINE 8.0 01/20/2014 1304   GLUCOSEU NEGATIVE 01/20/2014 1304   HGBUR LARGE (A) 01/20/2014 1304   BILIRUBINUR NEGATIVE 01/20/2014 1304   KETONESUR NEGATIVE 01/20/2014 1304   PROTEINUR 30 (A) 01/20/2014 1304   UROBILINOGEN 0.2 01/20/2014 1304   NITRITE NEGATIVE 01/20/2014 1304   LEUKOCYTESUR MODERATE (A) 01/20/2014 1304   Sepsis Labs Recent Labs  Lab 02/21/24 1657 02/22/24 0111  WBC 9.5 8.1   Microbiology Recent Results (from the past 240 hours)  Resp panel by RT-PCR (RSV, Flu A&B, Covid) Anterior Nasal Swab     Status: None   Collection Time: 02/21/24  6:53 PM   Specimen: Anterior Nasal Swab  Result Value Ref Range Status   SARS Coronavirus 2 by RT PCR NEGATIVE NEGATIVE Final    Comment: (NOTE) SARS-CoV-2 target nucleic acids are NOT DETECTED.  The SARS-CoV-2 RNA is generally detectable in upper respiratory specimens during the acute phase of infection. The lowest concentration of SARS-CoV-2 viral copies this assay can detect is 138 copies/mL. A negative result does not preclude SARS-Cov-2 infection and should not be used as the sole basis for treatment or other patient management decisions. A negative result may occur  with  improper specimen collection/handling, submission of specimen other than nasopharyngeal swab, presence of viral mutation(s) within the areas targeted by this assay, and inadequate number of viral copies(<138 copies/mL). A negative result must be combined with clinical observations, patient history, and epidemiological information. The expected result is Negative.  Fact Sheet for Patients:  bloggercourse.com  Fact Sheet for Healthcare Providers:  seriousbroker.it  This test is no t yet approved or cleared by the [place]  FDA and  has been authorized for detection and/or diagnosis of SARS-CoV-2 by FDA under an Emergency Use Authorization (EUA). This EUA will remain  in effect (meaning this test can be used) for the duration of the COVID-19 declaration under Section 564(b)(1) of the Act, 21 U.S.C.section 360bbb-3(b)(1), unless the authorization is terminated  or revoked sooner.       Influenza A by PCR NEGATIVE NEGATIVE Final   Influenza B by PCR NEGATIVE NEGATIVE Final    Comment: (NOTE) The Xpert Xpress SARS-CoV-2/FLU/RSV plus assay is intended as an aid in the diagnosis of influenza from Nasopharyngeal swab specimens and should not be used as a sole basis for treatment. Nasal washings and aspirates are unacceptable for Xpert Xpress SARS-CoV-2/FLU/RSV testing.  Fact Sheet for Patients: bloggercourse.com  Fact Sheet for Healthcare Providers: seriousbroker.it  This test is not yet approved or cleared by the [place]  FDA and has been authorized for detection and/or diagnosis of SARS-CoV-2 by FDA under an Emergency Use Authorization (EUA). This EUA will remain in effect (meaning this test can be used) for the duration of the COVID-19 declaration under Section 564(b)(1) of the Act,  21 U.S.C. section 360bbb-3(b)(1), unless the authorization is terminated  or revoked.     Resp Syncytial Virus by PCR NEGATIVE NEGATIVE Final    Comment: (NOTE) Fact Sheet for Patients: bloggercourse.com  Fact Sheet for Healthcare Providers: seriousbroker.it  This test is not yet approved or cleared by the [place]  FDA and has been authorized for detection and/or diagnosis of SARS-CoV-2 by FDA under an Emergency Use Authorization (EUA). This EUA will remain in effect (meaning this test can be used) for the duration of the COVID-19 declaration under Section 564(b)(1) of the Act, 21 U.S.C. section 360bbb-3(b)(1), unless the authorization is terminated or revoked.  Performed at [facility], [address] [place], [place] [address]   Respiratory (~20 pathogens) panel by PCR     Status: Abnormal   Collection Time: 02/21/24  6:53 PM   Specimen: Nasopharyngeal Swab; Respiratory  Result Value Ref Range Status   Adenovirus NOT DETECTED NOT DETECTED Final   Coronavirus 229E NOT DETECTED NOT DETECTED Final    Comment: (NOTE) The Coronavirus on the Respiratory Panel, DOES NOT test for the novel  Coronavirus (2019 nCoV)    Coronavirus HKU1 NOT DETECTED NOT DETECTED Final   Coronavirus NL63 NOT DETECTED NOT DETECTED Final   Coronavirus OC43 NOT DETECTED NOT DETECTED Final   Metapneumovirus NOT DETECTED NOT DETECTED Final   Rhinovirus / Enterovirus DETECTED (A) NOT DETECTED Final   Influenza A NOT DETECTED NOT DETECTED Final   Influenza B NOT DETECTED NOT DETECTED Final   Parainfluenza Virus 1 NOT DETECTED NOT DETECTED Final   Parainfluenza Virus 2 NOT DETECTED NOT DETECTED Final   Parainfluenza Virus 3 NOT DETECTED NOT DETECTED Final   Parainfluenza Virus 4 NOT DETECTED NOT DETECTED Final   Respiratory Syncytial Virus NOT DETECTED NOT DETECTED Final   Bordetella pertussis NOT DETECTED NOT DETECTED Final   Bordetella Parapertussis NOT DETECTED NOT DETECTED Final   Chlamydophila  pneumoniae NOT DETECTED NOT DETECTED Final   Mycoplasma pneumoniae NOT DETECTED NOT DETECTED Final    Comment: Performed at [facility], [address] [place], [place] [address]  MRSA Next Gen by PCR, Nasal     Status: None   Collection Time: 02/22/24 12:14 AM   Specimen: Nasal Mucosa; Nasal Swab  Result Value Ref Range Status   MRSA by PCR Next Gen NOT DETECTED NOT DETECTED Final    Comment: (NOTE) The GeneXpert MRSA Assay (FDA approved for NASAL specimens only), is one component of a comprehensive MRSA colonization surveillance program. It is not intended to diagnose MRSA infection nor to guide or monitor treatment for MRSA infections. Test performance is not FDA approved in patients less than 67 years old. Performed at [facility], [address], [place], [place] [address]     Procedures/Studies: ECHOCARDIOGRAM COMPLETE Result Date: 02/22/2024    ECHOCARDIOGRAM REPORT   Patient Name:   Blake Lara Date of Exam: 02/22/2024 Medical Rec #:  [id number]   Height:       59.0 in Accession #:    [id number]  Weight:       278.0 lb Date of Birth:  1980/08/04  BSA:          2.121 m Patient Age:    42 years    BP:           115/66 mmHg Patient Gender: M           HR:  87 bpm. Exam Location:  Inpatient Procedure: 2D Echo, Color Doppler, Cardiac Doppler and Intracardiac            Opacification Agent (Both Spectral and Color Flow Doppler were            utilized during procedure). Indications:    CHF  History:        Patient has no prior history of Echocardiogram examinations.                 COPD.  Sonographer:    Philomena Daring Referring Phys: [id number] JESSICA MARSHALL  Sonographer Comments: Technically challenging study due to limited acoustic windows, Technically difficult study due to poor echo windows, suboptimal apical window and patient is obese. Image acquisition challenging due to patient body habitus. IMPRESSIONS  1. Left ventricular endocardial border not  optimally defined to evaluate regional wall motion. Left ventricular diastolic parameters are indeterminate.  2. Right ventricular systolic function was not well visualized. The right ventricular size is not well visualized.  3. The mitral valve was not well visualized.  4. The aortic valve was not well visualized. Aortic valve regurgitation is not visualized. No aortic stenosis is present.  5. The inferior vena cava is dilated in size with <50% respiratory variability, suggesting right atrial pressure of 15 mmHg. Conclusion(s)/Recommendation(s): Technigcally very limited echo with almost no interpretable images. On the paraternal long axis images I get the sense that LV and RV function is grossly normal but images are insufficient to formally interpret. FINDINGS  Left Ventricle: Left ventricular endocardial border not optimally defined to evaluate regional wall motion. Definity  contrast agent was given IV to delineate the left ventricular endocardial borders. The left ventricular internal cavity size was normal in size. There is no left ventricular hypertrophy. Left ventricular diastolic parameters are indeterminate. Right Ventricle: The right ventricular size is not well visualized. Right vetricular wall thickness was not well visualized. Right ventricular systolic function was not well visualized. Left Atrium: Left atrial size was not well visualized. Right Atrium: Right atrial size was not well visualized. Pericardium: There is no evidence of pericardial effusion. Mitral Valve: The mitral valve was not well visualized. Tricuspid Valve: The tricuspid valve is not well visualized. Aortic Valve: The aortic valve was not well visualized. Aortic valve regurgitation is not visualized. No aortic stenosis is present. Pulmonic Valve: The pulmonic valve was not well visualized. Pulmonic valve regurgitation is trivial. No evidence of supravalvular pulmonic stenosis. The stenosis is supravalvular. Aorta: The aortic root is  normal in size and structure. Venous: The inferior vena cava is dilated in size with less than 50% respiratory variability, suggesting right atrial pressure of 15 mmHg. IAS/Shunts: No atrial level shunt detected by color flow Doppler.  LEFT VENTRICLE PLAX 2D LVOT diam:     2.20 cm   Diastology LVOT Area:     3.80 cm  LV e' medial:    5.66 cm/s                          LV E/e' medial:  17.4                          LV e' lateral:   5.77 cm/s                          LV E/e' lateral: 17.1  IVC IVC diam: 2.30 cm  AORTA Ao  Root diam: 3.00 cm Ao Asc diam:  3.00 cm MITRAL VALVE               TRICUSPID VALVE MV Area (PHT): 4.17 cm    TR Peak grad:   11.7 mmHg MV Decel Time: 182 msec    TR Vmax:        171.00 cm/s MV E velocity: 98.50 cm/s MV A velocity: 72.20 cm/s  SHUNTS MV E/A ratio:  1.36        Systemic Diam: 2.20 cm Toribio Fuel MD Electronically signed by Toribio Fuel MD Signature Date/Time: 02/22/2024/6:52:51 PM    Final    CT Angio Chest PE W and/or Wo Contrast Result Date: 02/21/2024 CLINICAL DATA:  Concern for pulmonary embolism. EXAM: CT ANGIOGRAPHY CHEST WITH CONTRAST TECHNIQUE: Multidetector CT imaging of the chest was performed using the standard protocol during bolus administration of intravenous contrast. Multiplanar CT image reconstructions and MIPs were obtained to evaluate the vascular anatomy. RADIATION DOSE REDUCTION: This exam was performed according to the departmental dose-optimization program which includes automated exposure control, adjustment of the mA and/or kV according to patient size and/or use of iterative reconstruction technique. CONTRAST:  75mL OMNIPAQUE  IOHEXOL  350 MG/ML SOLN COMPARISON:  Chest CT dated 12/17/2023. FINDINGS: Evaluation of this exam is limited due to respiratory motion. Cardiovascular: There is no cardiomegaly or pericardial effusion. The thoracic aorta is unremarkable. The origins of the great vessels of the aortic arch are patent. Mild dilatation of the  main pulmonary trunk suggestive of pulmonary hypertension. Clinical correlation recommended. Evaluation of the pulmonary arteries is limited due to respiratory motion. No central pulmonary artery embolus identified. Mediastinum/Nodes: Bilateral hilar adenopathy measure up to 2 cm in short axis on the right. Right paratracheal lymph node measures 12 mm. The esophagus is grossly unremarkable. No mediastinal fluid collection. Lungs/Pleura: There is elevation of the diaphragms. Bibasilar patchy and streaky consolidation may represent atelectasis or infiltrate. No pleural effusion or pneumothorax. The central airways are patent. Upper Abdomen: No acute abnormality. Musculoskeletal: Scoliosis.  No acute osseous pathology. Review of the MIP images confirms the above findings. IMPRESSION: 1. No CT evidence of central pulmonary artery embolus. 2. Bibasilar atelectasis or infiltrate. 3. Bilateral hilar adenopathy, likely reactive. Electronically Signed   By: Vanetta Chou M.D.   On: 02/21/2024 18:38   DG Chest Portable 1 View Result Date: 02/21/2024 CLINICAL DATA:  Shortness of breath EXAM: PORTABLE CHEST 1 VIEW COMPARISON:  December 15, 2023 FINDINGS: Stable cardiomediastinal silhouette. Stable elevated left hemidiaphragm. Mild right basilar atelectasis is noted. Bony thorax is unremarkable. IMPRESSION: Mild right basilar subsegmental atelectasis. Electronically Signed   By: Lynwood Landy Raddle M.D.   On: 02/21/2024 16:48     Time coordinating discharge: Over 30 minutes    Alm Apo, MD  [facility] 02/25/2024, 1:38 PM    [1]  Allergies Allergen Reactions   Ceftriaxone  Hives and Anaphylaxis   Latex Hives and Shortness Of Breath   Morphine  Hives and Shortness Of Breath   Naproxen Sodium Hives   Ibuprofen Hives   Lentil Other (See Comments)    BEANS - UNABLE TO DIGEST DUE TO ILEOSTOMY   "

## 2024-02-25 NOTE — Progress Notes (Signed)
 Discharge meds in a secure bag delivered to patient by this RN

## 2024-02-25 NOTE — Progress Notes (Signed)
" °   02/25/24 1347  PT Visit Information  Last PT Received On 02/25/24  Reason Eval/Treat Not Completed Other (comment)  History of Present Illness 43 y.o. male admitted 02/21/24 with COPD exacerbation. PMH: spina bifida, OSA, uses O2 at night, anxiety, GERD.   Discussed PT order with MD, pt bed bound at baseline with UE strength WFL. PT consult not indicated at this time, please place order if indications change.   Stann, PT Acute Rehabilitation Services Office: [phone] 02/25/2024   "

## 2024-02-26 LAB — BLOOD GAS, ARTERIAL
Drawn by: 56037
O2 Saturation: 90.9 %
O2 Saturation: 90.9 % — AB (ref 20.0–28.0)
Patient temperature: 36.9 mmol/L — AB (ref 0.0–2.0)
Patient temperature: 56037
pCO2 arterial: 49 mmHg — AB (ref 32–48)
pCO2 arterial: 49 mmHg — ABNORMAL HIGH (ref 32–48)
pH, Arterial: 7.43 (ref 7.35–7.45)
pO2, Arterial: 52 mmHg — AB (ref 83–7.45)
pO2, Arterial: 52 mmHg — ABNORMAL LOW (ref 83–2.0)
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
## 2024-03-13 ENCOUNTER — Ambulatory Visit
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
## 2024-03-20 ENCOUNTER — Ambulatory Visit

## 2024-03-20 VITALS — BP 128/78 | HR 82 | Temp 98.1°F | Ht 59.0 in | Wt 252.8 lb

## 2024-03-20 DIAGNOSIS — J9612 Chronic respiratory failure with hypercapnia: Secondary | ICD-10-CM | POA: Diagnosis not present

## 2024-03-20 DIAGNOSIS — J9611 Chronic respiratory failure with hypoxia: Secondary | ICD-10-CM

## 2024-03-20 DIAGNOSIS — Z6841 Body Mass Index (BMI) 40.0 and over, adult: Secondary | ICD-10-CM | POA: Diagnosis not present

## 2024-03-20 DIAGNOSIS — R0600 Dyspnea, unspecified: Secondary | ICD-10-CM

## 2024-03-20 DIAGNOSIS — Z87891 Personal history of nicotine dependence: Secondary | ICD-10-CM | POA: Diagnosis not present

## 2024-03-20 MED ORDER — SPIRIVA RESPIMAT 1.25 MCG/ACT IN AERS: 2.0000 | INHALATION_SPRAY | Freq: Every day | RESPIRATORY_TRACT | 10 refills | Status: AC

## 2024-03-20 MED ORDER — BUDESONIDE-FORMOTEROL FUMARATE 160-4.5 MCG/ACT IN AERO: 2.0000 | INHALATION_SPRAY | Freq: Two times a day (BID) | RESPIRATORY_TRACT | 6 refills | Status: AC

## 2024-03-20 MED ORDER — AEROCHAMBER MV MISC: 0 refills | Status: AC

## 2024-03-20 NOTE — Patient Instructions (Addendum)
 Dear Mr. Jarreau;  I will recommend the following:  COPD treatment: -Symbicort  2 puffs twice a day. Rinse your mouth after each use. -Spiriva  2 puffs daily. -Use the albuterol  inhaler or nebulizer as need it for shortness of breath, chest congestion every 6 to 8 hours.  2. Weight loss -It is really important to exercise and have healthy eating habits. -I am glad you are on the medication to help you to lose weight.  3. I would like to rule out the diaphragm paralysis with the sniff study. I placed a new order.   4. I placed an order for bipap as well.  5. Use oxygen 3-4 L at rest and at night, and 5-6L with exertion. If your oxygen is <88% you need to increase it.   5. I placed a new order for a pulmonary function test at the next appointment in 2 months

## 2024-03-27 ENCOUNTER — Other Ambulatory Visit (HOSPITAL_COMMUNITY): Payer: Self-pay

## 2024-03-27 ENCOUNTER — Telehealth: Payer: Self-pay

## 2024-03-27 NOTE — Telephone Encounter (Signed)
*  [facility]armacy Patient Advocate Encounter   Received notification from Fax that prior authorization for Budesonide -Formoterol  Fumarate 160-4.5MCG/ACT aerosol   is required/requested.   Insurance verification completed.   The patient is insured through [facility] MEDICAID.   Per test claim: Refill too soon. PA is not needed at this time. Medication was filled 03/21/2024. Next eligible fill date is 04/12/2024.   Key: [id number]

## 2024-06-06 ENCOUNTER — Ambulatory Visit

## 2024-06-06 ENCOUNTER — Encounter
